# Patient Record
Sex: Female | Born: 1955 | Race: Black or African American | Hispanic: No | Smoking: Never smoker
Health system: Southern US, Community
[De-identification: ages and names within clinical notes are randomized; demographics above are authoritative.]

## PROBLEM LIST (undated history)

## (undated) DIAGNOSIS — J449 Chronic obstructive pulmonary disease, unspecified: Secondary | ICD-10-CM

## (undated) DIAGNOSIS — I499 Cardiac arrhythmia, unspecified: Secondary | ICD-10-CM

## (undated) DIAGNOSIS — I4891 Unspecified atrial fibrillation: Secondary | ICD-10-CM

## (undated) DIAGNOSIS — I10 Essential (primary) hypertension: Secondary | ICD-10-CM

## (undated) DIAGNOSIS — E119 Type 2 diabetes mellitus without complications: Secondary | ICD-10-CM

## (undated) DIAGNOSIS — D649 Anemia, unspecified: Secondary | ICD-10-CM

## (undated) DIAGNOSIS — G4730 Sleep apnea, unspecified: Secondary | ICD-10-CM

## (undated) DIAGNOSIS — Z8489 Family history of other specified conditions: Secondary | ICD-10-CM

## (undated) DIAGNOSIS — T8859XA Other complications of anesthesia, initial encounter: Secondary | ICD-10-CM

## (undated) DIAGNOSIS — I480 Paroxysmal atrial fibrillation: Secondary | ICD-10-CM

## (undated) DIAGNOSIS — I509 Heart failure, unspecified: Secondary | ICD-10-CM

## (undated) DIAGNOSIS — K760 Fatty (change of) liver, not elsewhere classified: Secondary | ICD-10-CM

## (undated) DIAGNOSIS — S0990XA Unspecified injury of head, initial encounter: Secondary | ICD-10-CM

## (undated) DIAGNOSIS — E785 Hyperlipidemia, unspecified: Secondary | ICD-10-CM

## (undated) HISTORY — DX: Fatty (change of) liver, not elsewhere classified: K76.0

## (undated) HISTORY — PX: BREAST REDUCTION SURGERY: SHX8

## (undated) HISTORY — DX: Essential (primary) hypertension: I10

## (undated) HISTORY — PX: OTHER SURGICAL HISTORY: SHX169

## (undated) HISTORY — DX: Paroxysmal atrial fibrillation: I48.0

## (undated) HISTORY — DX: Type 2 diabetes mellitus without complications: E11.9

## (undated) HISTORY — PX: ABDOMINAL HYSTERECTOMY: SHX81

## (undated) HISTORY — DX: Anemia, unspecified: D64.9

## (undated) HISTORY — DX: Unspecified injury of head, initial encounter: S09.90XA

## (undated) SURGERY — Surgical Case
Anesthesia: *Unknown

---

## 2005-08-12 ENCOUNTER — Encounter: Payer: Self-pay | Admitting: Cardiology

## 2007-11-21 ENCOUNTER — Encounter: Payer: Self-pay | Admitting: Cardiology

## 2008-03-01 ENCOUNTER — Encounter: Payer: Self-pay | Admitting: Cardiology

## 2009-03-18 ENCOUNTER — Encounter: Payer: Self-pay | Admitting: Cardiology

## 2009-07-14 ENCOUNTER — Encounter: Payer: Self-pay | Admitting: Cardiology

## 2009-07-26 DIAGNOSIS — I639 Cerebral infarction, unspecified: Secondary | ICD-10-CM

## 2009-07-26 HISTORY — DX: Cerebral infarction, unspecified: I63.9

## 2009-08-07 ENCOUNTER — Encounter (INDEPENDENT_AMBULATORY_CARE_PROVIDER_SITE_OTHER): Payer: Self-pay | Admitting: *Deleted

## 2009-08-07 ENCOUNTER — Ambulatory Visit: Payer: Self-pay | Admitting: Cardiology

## 2009-08-07 DIAGNOSIS — I10 Essential (primary) hypertension: Secondary | ICD-10-CM | POA: Insufficient documentation

## 2009-08-07 DIAGNOSIS — E119 Type 2 diabetes mellitus without complications: Secondary | ICD-10-CM | POA: Insufficient documentation

## 2009-08-07 DIAGNOSIS — R072 Precordial pain: Secondary | ICD-10-CM

## 2009-08-07 DIAGNOSIS — R002 Palpitations: Secondary | ICD-10-CM | POA: Insufficient documentation

## 2009-08-11 ENCOUNTER — Ambulatory Visit: Payer: Self-pay | Admitting: Cardiology

## 2009-08-11 ENCOUNTER — Encounter: Payer: Self-pay | Admitting: Cardiology

## 2009-08-20 ENCOUNTER — Telehealth (INDEPENDENT_AMBULATORY_CARE_PROVIDER_SITE_OTHER): Payer: Self-pay | Admitting: *Deleted

## 2009-08-27 ENCOUNTER — Encounter: Payer: Self-pay | Admitting: Cardiology

## 2009-08-28 ENCOUNTER — Encounter: Payer: Self-pay | Admitting: Cardiology

## 2009-08-29 ENCOUNTER — Encounter: Payer: Self-pay | Admitting: Cardiology

## 2009-09-03 ENCOUNTER — Encounter: Payer: Self-pay | Admitting: Cardiology

## 2009-09-04 ENCOUNTER — Ambulatory Visit: Payer: Self-pay | Admitting: Cardiology

## 2009-09-04 DIAGNOSIS — M79609 Pain in unspecified limb: Secondary | ICD-10-CM

## 2009-09-04 DIAGNOSIS — I482 Chronic atrial fibrillation, unspecified: Secondary | ICD-10-CM | POA: Insufficient documentation

## 2009-09-04 DIAGNOSIS — I4891 Unspecified atrial fibrillation: Secondary | ICD-10-CM | POA: Insufficient documentation

## 2009-09-08 ENCOUNTER — Telehealth (INDEPENDENT_AMBULATORY_CARE_PROVIDER_SITE_OTHER): Payer: Self-pay | Admitting: *Deleted

## 2009-09-12 ENCOUNTER — Encounter: Payer: Self-pay | Admitting: Cardiology

## 2009-09-12 ENCOUNTER — Ambulatory Visit: Payer: Self-pay | Admitting: Cardiology

## 2009-09-16 ENCOUNTER — Ambulatory Visit: Payer: Self-pay | Admitting: Cardiology

## 2009-09-16 LAB — CONVERTED CEMR LAB: POC INR: 1.2

## 2009-09-19 ENCOUNTER — Telehealth (INDEPENDENT_AMBULATORY_CARE_PROVIDER_SITE_OTHER): Payer: Self-pay | Admitting: *Deleted

## 2009-09-23 ENCOUNTER — Ambulatory Visit: Payer: Self-pay | Admitting: Cardiology

## 2009-09-30 ENCOUNTER — Ambulatory Visit: Payer: Self-pay | Admitting: Cardiology

## 2009-09-30 LAB — CONVERTED CEMR LAB: POC INR: 1.8

## 2009-10-01 ENCOUNTER — Telehealth (INDEPENDENT_AMBULATORY_CARE_PROVIDER_SITE_OTHER): Payer: Self-pay | Admitting: *Deleted

## 2009-10-14 ENCOUNTER — Ambulatory Visit: Payer: Self-pay | Admitting: Cardiology

## 2009-10-24 ENCOUNTER — Encounter: Payer: Self-pay | Admitting: Cardiology

## 2009-10-24 ENCOUNTER — Ambulatory Visit: Payer: Self-pay | Admitting: Cardiology

## 2009-10-24 LAB — CONVERTED CEMR LAB: POC INR: 2.2

## 2009-11-04 ENCOUNTER — Ambulatory Visit: Payer: Self-pay | Admitting: Cardiology

## 2009-11-04 LAB — CONVERTED CEMR LAB: POC INR: 1.9

## 2009-11-11 ENCOUNTER — Telehealth (INDEPENDENT_AMBULATORY_CARE_PROVIDER_SITE_OTHER): Payer: Self-pay | Admitting: *Deleted

## 2009-11-11 ENCOUNTER — Encounter: Payer: Self-pay | Admitting: Cardiology

## 2009-11-21 ENCOUNTER — Ambulatory Visit: Payer: Self-pay | Admitting: Cardiology

## 2009-12-16 ENCOUNTER — Ambulatory Visit: Payer: Self-pay | Admitting: Cardiology

## 2009-12-16 LAB — CONVERTED CEMR LAB: POC INR: 2.6

## 2010-01-16 ENCOUNTER — Ambulatory Visit: Payer: Self-pay | Admitting: Cardiology

## 2010-01-16 LAB — CONVERTED CEMR LAB: POC INR: 2.9

## 2010-02-13 ENCOUNTER — Ambulatory Visit: Payer: Self-pay | Admitting: Cardiology

## 2010-02-13 LAB — CONVERTED CEMR LAB: POC INR: 2.7

## 2010-03-13 ENCOUNTER — Ambulatory Visit: Payer: Self-pay | Admitting: Cardiology

## 2010-04-07 ENCOUNTER — Ambulatory Visit: Payer: Self-pay | Admitting: Cardiology

## 2010-05-01 ENCOUNTER — Encounter: Payer: Self-pay | Admitting: Cardiology

## 2010-06-25 DIAGNOSIS — S0990XA Unspecified injury of head, initial encounter: Secondary | ICD-10-CM

## 2010-06-25 HISTORY — DX: Unspecified injury of head, initial encounter: S09.90XA

## 2010-08-25 NOTE — Progress Notes (Signed)
Summary: INCREASED PALPITATION,HR  Phone Note Call from Patient Call back at [phone]   Caller: Patient Call For: doctor Summary of Call: Message left on voicemail from patient saying she is taking cardizem and is still having problems with palpitations,&increased  pulse 158-164 and her BP is 145/84. Please advise. Initial call taken by: Carlye Grippe,  September 19, 2009 2:13 PM  Follow-up for Phone Call        Increase toprol to 100 mg by mouth daily; make sure she has a f/u ov Ferman Hamming, MD, [facility]  September 19, 2009 2:24 PM Toprol was changed to Diltiazem d/t headaches. Cyril Loosen, RN, BSN  September 19, 2009 2:31 PM    Additional Follow-up for Phone Call Additional follow up Details #1::        change cardizem cd to 240 mg by mouth daily and schedule f/u ov Ferman Hamming, MD, [facility]  September 19, 2009 2:40 PM Pt notified. Pt verbalized understanding. She states she has felt more tired than usual since starting the diltiazem but will try to continue it for another week or so to see if this eases off. If she is unable to tolerate the diltiazem or has further problems, pt will contact our office. Additional Follow-up by: Cyril Loosen, RN, BSN,  September 19, 2009 2:49 PM    New/Updated Medications: DILTIAZEM HCL ER BEADS 240 MG XR24H-CAP (DILTIAZEM HCL ER BEADS) Take one capsule by mouth daily Prescriptions: DILTIAZEM HCL ER BEADS 240 MG XR24H-CAP (DILTIAZEM HCL ER BEADS) Take one capsule by mouth daily  #30 x 6   Entered by:   Cyril Loosen, RN, BSN   Authorized by:   Ferman Hamming, MD, [facility]   Signed by:   Cyril Loosen, RN, BSN on 09/19/2009   Method used:   [facility]        [facility]  [address] [phone]* (retail)       [address]       [place], [place]  [address]       Ph: [phone]       Fax: [phone]   RxID:   [id number]

## 2010-08-25 NOTE — Medication Information (Signed)
Summary: ccr  Anticoagulant Therapy  Managed by: Vashti Hey, RN PCP: Dr. Lynden Oxford Supervising MD: Andee Lineman MD, Michelle Piper Indication 1: Atrial Fibrillation Lab Used: [facility] [place] Site: [place] INR POC 1.4  Dietary changes: no    Health status changes: no    Bleeding/hemorrhagic complications: no    Recent/future hospitalizations: no    Any changes in medication regimen? no    Recent/future dental: no  Any missed doses?: no       Is patient compliant with meds? yes       Allergies: 1)  ! Lisinopril  Anticoagulation Management History:      The patient is taking warfarin and comes in today for a routine follow up visit.  Positive risk factors for bleeding include presence of serious comorbidities.  Negative risk factors for bleeding include an age less than 15 years old.  The bleeding index is 'intermediate risk'.  Positive CHADS2 values include History of HTN and History of Diabetes.  Negative CHADS2 values include Age > 80 years old.  Anticoagulation responsible provider: Andee Lineman MD, Michelle Piper.  INR POC: 1.4.  Cuvette Lot#: [id number].    Anticoagulation Management Assessment/Plan:      The patient's current anticoagulation dose is Warfarin sodium 5 mg tabs: Use as directed by Anticoagulation Clinic.  The target INR is 2.0-3.0.  The next INR is due 09/30/2009.  Anticoagulation instructions were given to patient.  Results were reviewed/authorized by Vashti Hey, RN.  She was notified by Vashti Hey RN.         Prior Anticoagulation Instructions: INR 1.2 Increase couamdin to 7.5mg  once daily   Current Anticoagulation Instructions: INR 1.4 Increase coumadin to 10mg  once daily except 7.5mg  on Mondays and Fridays

## 2010-08-25 NOTE — Procedures (Signed)
Summary: URGENT CARDIONET REPORT  URGENT CARDIONET REPORT   Imported By: Cyril Loosen, RN, BSN 08/27/2009 17:11:51  _____________________________________________________________________  External Attachment:    Type:   Image     Comment:   External Document  Appended Document: URGENT CARDIONET REPORT increase aspirin to 325 mg by mouth daily; schedule f/u ov soon. appear to have paf and will most likely need coumadin.  Appended Document: URGENT CARDIONET REPORT Pt notified and verbalized understanding. Pt's appt r/s from 2/23 to 2/10.

## 2010-08-25 NOTE — Medication Information (Signed)
Summary: NEW CCR-STARTED 2/10-JM  Anticoagulant Therapy  Managed by: Vashti Hey, RN PCP: Dr. Lynden Oxford Supervising MD: Diona Browner MD, Remi Deter Indication 1: Atrial Fibrillation Lab Used: [facility] [place] Site: [place] INR POC 1.1  Dietary changes: no    Health status changes: no    Bleeding/hemorrhagic complications: no    Recent/future hospitalizations: no    Any changes in medication regimen? yes       Details: Started on coumadin 5mg  qd on 09/08/09  Has 5mg  tablet  Recent/future dental: no  Any missed doses?: no       Is patient compliant with meds? yes      Comments: New to coumadin for atrial fib.  Coumadin teaching performed with pt.  Discussed potential benefits,adverse effects, food/drug interactions and importance of taking med as ordered and having INR checks as scheduled.  Pt verbalized understanding.  Allergies: 1)  ! Lisinopril  Anticoagulation Management History:      The patient comes in today for her initial visit for anticoagulation therapy.  Positive risk factors for bleeding include presence of serious comorbidities.  Negative risk factors for bleeding include an age less than 70 years old.  The bleeding index is 'intermediate risk'.  Positive CHADS2 values include History of HTN and History of Diabetes.  Negative CHADS2 values include Age > 74 years old.  Anticoagulation responsible provider: Diona Browner MD, Remi Deter.  INR POC: 1.1.    Anticoagulation Management Assessment/Plan:      The patient's current anticoagulation dose is Warfarin sodium 5 mg tabs: Use as directed by Anticoagulation Clinic.  The target INR is 2.0-3.0.  The next INR is due 09/16/2009.  Anticoagulation instructions were given to patient.  Results were reviewed/authorized by Vashti Hey, RN.  She was notified by Vashti Hey RN.        Coagulation management information includes: New Afib-  Medical Tx only at this time  Appt with Dr Andee Lineman 10/14/09.  Current Anticoagulation  Instructions: INR 1.1 Has only had 4 days of 5mg s.  Started on 2/14 Continue coumadin 5mg  once daily

## 2010-08-25 NOTE — Medication Information (Signed)
Summary: ccr-lr  Anticoagulant Therapy  Managed by: Vashti Hey, RN PCP: Dr. Lynden Oxford Supervising MD: Andee Lineman MD, Michelle Piper Indication 1: Atrial Fibrillation Lab Used: [facility] [place] Site: [place] INR POC 3.0  Dietary changes: no    Health status changes: no    Bleeding/hemorrhagic complications: no    Recent/future hospitalizations: no    Any changes in medication regimen? no    Recent/future dental: no  Any missed doses?: yes     Details: missed 1 dose last week  Is patient compliant with meds? yes       Allergies: 1)  ! Lisinopril  Anticoagulation Management History:      The patient is taking warfarin and comes in today for a routine follow up visit.  Positive risk factors for bleeding include presence of serious comorbidities.  Negative risk factors for bleeding include an age less than 57 years old.  The bleeding index is 'intermediate risk'.  Positive CHADS2 values include History of HTN and History of Diabetes.  Negative CHADS2 values include Age > 63 years old.  Anticoagulation responsible provider: Andee Lineman MD, Michelle Piper.  INR POC: 3.0.  Cuvette Lot#: [id number].  Exp: 02/2011.    Anticoagulation Management Assessment/Plan:      The patient's current anticoagulation dose is Warfarin sodium 5 mg tabs: Use as directed by Anticoagulation Clinic.  The target INR is 2.0-3.0.  The next INR is due 04/10/2010.  Anticoagulation instructions were given to patient.  Results were reviewed/authorized by Vashti Hey, RN.  She was notified by Vashti Hey RN.         Prior Anticoagulation Instructions: INR 2.7 Continue coumadin 12.5mg  once daily except 10mg  on Mondays  Current Anticoagulation Instructions: INR 3.0 Continue coumadin 12.5mg  once daily except 10mg  on Mondays Increase greens

## 2010-08-25 NOTE — Progress Notes (Signed)
Summary: re. co-pay  ---- Converted from flag ---- ---- 10/01/2009 3:07 PM, Alexis Goodell wrote: I called Jersi and told her we would monitor her claims for about 30 days or longer to determine what the insurance company would pay.  Once we have determined this amount we will charge this on an ongoing basis.  ---- 09/30/2009 12:04 PM, Vashti Hey RN wrote: Molly Maduro Please call pt about her $25 co-pay. Thanks Misty Stanley ------------------------------

## 2010-08-25 NOTE — Medication Information (Signed)
Summary: CCR  Anticoagulant Therapy  Managed by: Vashti Hey, RN PCP: Dr. Lynden Oxford Supervising MD: Andee Lineman MD, Michelle Piper Indication 1: Atrial Fibrillation Lab Used: [facility] [place] Site: [place] INR POC 1.8  Dietary changes: no    Health status changes: no    Bleeding/hemorrhagic complications: no    Recent/future hospitalizations: no    Any changes in medication regimen? no    Recent/future dental: no  Any missed doses?: no       Is patient compliant with meds? yes       Allergies: 1)  ! Lisinopril  Anticoagulation Management History:      The patient is taking warfarin and comes in today for a routine follow up visit.  Positive risk factors for bleeding include presence of serious comorbidities.  Negative risk factors for bleeding include an age less than 89 years old.  The bleeding index is 'intermediate risk'.  Positive CHADS2 values include History of HTN and History of Diabetes.  Negative CHADS2 values include Age > 2 years old.  Anticoagulation responsible provider: Andee Lineman MD, Michelle Piper.  INR POC: 1.8.  Cuvette Lot#: [id number].    Anticoagulation Management Assessment/Plan:      The patient's current anticoagulation dose is Warfarin sodium 5 mg tabs: Use as directed by Anticoagulation Clinic.  The target INR is 2.0-3.0.  The next INR is due 10/14/2009.  Anticoagulation instructions were given to patient.  Results were reviewed/authorized by Vashti Hey, RN.  She was notified by Vashti Hey RN.         Prior Anticoagulation Instructions: INR 1.4 Increase coumadin to 10mg  once daily except 7.5mg  on Mondays and Fridays  Current Anticoagulation Instructions: INR 1.8 Continue coumadin 10mg  once daily except 7.5mg  on Mondays and Fridays I

## 2010-08-25 NOTE — Procedures (Signed)
Summary: Urgent Cardionet Reports  Urgent Cardionet Reports   Imported By: Cyril Loosen, RN, BSN 08/29/2009 08:46:26  _____________________________________________________________________  External Attachment:    Type:   Image     Comment:   External Document  Appended Document: Urgent Cardionet Reports dc hctz; add toprol 50 mg by mouth daily.  Appended Document: Urgent Cardionet Reports Pt notified and verbalized understanding.   Clinical Lists Changes  Medications: Added new medication of METOPROLOL SUCCINATE 50 MG XR24H-TAB (METOPROLOL SUCCINATE) Take one tablet by mouth daily - Signed Rx of METOPROLOL SUCCINATE 50 MG XR24H-TAB (METOPROLOL SUCCINATE) Take one tablet by mouth daily;  #60 x 6;  Signed;  Entered by: Cyril Loosen, RN, BSN;  Authorized by: Ferman Hamming, MD, [facility];  Method used: [facility]  [address] [phone]*, [address], [place], [place]  [address], Ph: [phone], Fax: [phone]    Prescriptions: METOPROLOL SUCCINATE 50 MG XR24H-TAB (METOPROLOL SUCCINATE) Take one tablet by mouth daily  #60 x 6   Entered by:   Cyril Loosen, RN, BSN   Authorized by:   Ferman Hamming, MD, [facility]   Signed by:   Cyril Loosen, RN, BSN on 08/29/2009   Method used:   [facility]        [facility]  [address] [phone]* (retail)       [address]       [place], [place]  [address]       Ph: [phone]       Fax: [phone]   RxID:   [id number]

## 2010-08-25 NOTE — Procedures (Signed)
Summary: Holter and Event/ CARDIONET END OF SERVICE SUMMARY REPORT  Holter and Event/ CARDIONET END OF SERVICE SUMMARY REPORT   Imported By: Dorise Hiss 09/23/2009 15:27:17  _____________________________________________________________________  External Attachment:    Type:   Image     Comment:   External Document

## 2010-08-25 NOTE — Progress Notes (Signed)
Summary: STATUS OF STARTING COUMADIN/TOPROL SIDE EFFECT  Phone Note Call from Patient Call back at [phone]   Call For: nurse Summary of Call: Patient was told to start coumadin by Dr. Jens Som but haven't started it yet and she needed to cancel am appt. patient states she will call back to reshedule. Patient c/o to MD during ov that the  toprol was causing bad h/a. she was informed to start taking them in the morning instead of night and is still having bad h/a. Please advise. Initial call taken by: Carlye Grippe,  September 08, 2009 3:41 PM  Follow-up for Phone Call        Spoke with patient who state she had doubts about starting the coumadin. She states she called Dr. Margo Common and he was going to look at reports to let her know if she needs coumadin. Upon further discussion with pt, pt states she's just not sure if she wants to deal with PT/INR's. Pt notified of risks of a.fib without proper anticoagulation including risk of stroke. Pt states she will start coumadin. Pt scheduled for CCR visit with Misty Stanley on 2/18 following her echo at [facility].   Pt states she started Toprol on a Friday. She states that Sat or Sun she began having headaches. She discussed these headaches with Dr. Jens Som during recent office visit. She tried taking Toprol in am instead of pm but has not noticed any improvement with this change. She states these headaches are really bad, especially as she does not normally have headaches.  Follow-up by: Cyril Loosen, RN, BSN,  September 08, 2009 4:41 PM  Additional Follow-up for Phone Call Additional follow up Details #1::        DC toprol; cardizem CD 120 mg by mouth daily Ferman Hamming, MD, [facility]  September 09, 2009 12:21 PM Pt notified. Pt verbalized understanding.  Additional Follow-up by: Cyril Loosen, RN, BSN,  September 09, 2009 5:15 PM    New/Updated Medications: DILTIAZEM HCL ER BEADS 120 MG XR24H-CAP (DILTIAZEM HCL ER BEADS) Take one capsule by mouth  daily Prescriptions: DILTIAZEM HCL ER BEADS 120 MG XR24H-CAP (DILTIAZEM HCL ER BEADS) Take one capsule by mouth daily  #30 x 6   Entered by:   Cyril Loosen, RN, BSN   Authorized by:   Ferman Hamming, MD, [facility]   Signed by:   Cyril Loosen, RN, BSN on 09/09/2009   Method used:   [facility]        [facility]  [address] [phone]* (retail)       [address]       [place], [place]  [address]       Ph: [phone]       Fax: [phone]   RxID:   [id number]

## 2010-08-25 NOTE — Medication Information (Signed)
Summary: ccr-lr  Anticoagulant Therapy  Managed by: Weston Brass, PharmD PCP: Dr. Lynden Oxford Supervising MD: Andee Lineman MD, Michelle Piper Indication 1: Atrial Fibrillation Lab Used: [facility] [place] Site: [place] INR POC 2.9  Dietary changes: no    Health status changes: yes       Details: has had some swelling in ankles.  Is improving with addition of HCTZ  Bleeding/hemorrhagic complications: no    Recent/future hospitalizations: no    Any changes in medication regimen? yes       Details: changed benicar to losartan/hctz  Recent/future dental: no  Any missed doses?: no       Is patient compliant with meds? yes       Current Medications (verified): 1)  Glipizide 5 Mg Tabs (Glipizide) .... Take 1 Tablet By Mouth Daily 2)  Metformin Hcl 500 Mg Tabs (Metformin Hcl) .... Take 3 Tablet By Mouth Once A Day 3)  Tylenol With Codeine #3 300-30 Mg Tabs (Acetaminophen-Codeine) .... Take 1-2 Tablets Every 4-6 Hours As Needed Pain. 4)  Warfarin Sodium 5 Mg Tabs (Warfarin Sodium) .... Use As Directed By Anticoagulation Clinic 5)  Diltiazem Hcl Er Beads 240 Mg Xr24h-Cap (Diltiazem Hcl Er Beads) .... Take One Capsule By Mouth Daily 6)  Diltiazem Hcl 60 Mg Tabs (Diltiazem Hcl) .... Take One Tablet As Needed For Palpitations, No More Than One Per 24 Hours 7)  Flecainide Acetate 100 Mg Tabs (Flecainide Acetate) .... Take 1/2 Tab (50mg ) Two Times A Day 8)  Losartan Potassium-Hctz 100-25 Mg Tabs (Losartan Potassium-Hctz) .... Take 1 Tablet By Mouth Daily  Allergies: 1)  ! Lisinopril  Anticoagulation Management History:      The patient is taking warfarin and comes in today for a routine follow up visit.  Positive risk factors for bleeding include presence of serious comorbidities.  Negative risk factors for bleeding include an age less than 58 years old.  The bleeding index is 'intermediate risk'.  Positive CHADS2 values include History of HTN and History of Diabetes.  Negative CHADS2 values  include Age > 72 years old.  Anticoagulation responsible provider: Andee Lineman MD, Michelle Piper.  INR POC: 2.9.  Cuvette Lot#: [id number].  Exp: 02/2011.    Anticoagulation Management Assessment/Plan:      The patient's current anticoagulation dose is Warfarin sodium 5 mg tabs: Use as directed by Anticoagulation Clinic.  The target INR is 2.0-3.0.  The next INR is due 02/13/2010.  Anticoagulation instructions were given to patient.  Results were reviewed/authorized by Weston Brass, PharmD.  She was notified by Weston Brass PharmD.         Prior Anticoagulation Instructions: INR 2.6 Continue coumadin 12.5mg  once daily except 10mg  on Mondays  Current Anticoagulation Instructions: INR 2.9  Continue same dose of 12.5mg  daily except 10mg  on Monday.

## 2010-08-25 NOTE — Procedures (Signed)
Summary: Holter and Event/ CARDIONET DAILY REPORT  Holter and Event/ CARDIONET DAILY REPORT   Imported By: Dorise Hiss 09/05/2009 09:35:44  _____________________________________________________________________  External Attachment:    Type:   Image     Comment:   External Document

## 2010-08-25 NOTE — Assessment & Plan Note (Signed)
Summary: 1 MO   Visit Type:  Follow-up Primary Provider:  Dr. Lynden Oxford   History of Present Illness: the patient is a 55 year old African American female with history of paroxysmal atrial fibrillation. The patient has been started on flecainide. She has maintained normal sinus rhythm. Flecainide levels were determined as well as exercise testing to rule out any proarrhytmic risk. The patient however was not clear and instructions and is taking flecainide just once a day. She has been doing well with no recurrence of palpitations. She states that she is much improved. She denies any chest pain or shortness of breath presyncope or syncope. EKG today and states normal sinus rhythm  Preventive Screening-Counseling & Management  Alcohol-Tobacco     Smoking Status: never  Current Problems (verified): 1)  Coumadin Therapy  (ICD-V58.61) 2)  Atrial Fibrillation  (ICD-427.31) 3)  Atrial Fibrillation, Paroxysmal  (ICD-427.31) 4)  Finger Pain  (ICD-729.5) 5)  Essential Hypertension, Benign  (ICD-401.1) 6)  Dm  (ICD-250.00) 7)  Palpitations  (ICD-785.1) 8)  Chest Pain, Precordial  (ICD-786.51)  Current Medications (verified): 1)  Benicar 40 Mg Tabs (Olmesartan Medoxomil) .... Take 1 Tablet By Mouth Once A Day 2)  Glipizide 5 Mg Tabs (Glipizide) .... Take 1 Tablet By Mouth Daily 3)  Metformin Hcl 500 Mg Tabs (Metformin Hcl) .... Take 3 Tablet By Mouth Once A Day 4)  Tylenol With Codeine #3 300-30 Mg Tabs (Acetaminophen-Codeine) .... Take 1-2 Tablets Every 4-6 Hours As Needed Pain. 5)  Warfarin Sodium 5 Mg Tabs (Warfarin Sodium) .... Use As Directed By Anticoagulation Clinic 6)  Diltiazem Hcl Er Beads 240 Mg Xr24h-Cap (Diltiazem Hcl Er Beads) .... Take One Capsule By Mouth Daily 7)  Diltiazem Hcl 60 Mg Tabs (Diltiazem Hcl) .... Take One Tablet As Needed For Palpitations, No More Than One Per 24 Hours 8)  Flecainide Acetate 100 Mg Tabs (Flecainide Acetate) .... Take 1/2 Tab (50mg ) Two Times A  Day  Allergies: 1)  ! Lisinopril  Comments:  Nurse/Medical Assistant: The patient's medications were reviewed with the patient and were updated in the Medication List. Pt verbally confirmed medications. Pt has not increased Flecainide to two times a day yet. She wanted to d/w MD first. 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[employer]) Regular Exercise - no  Risk Factors: Exercise: no (08/07/2009)  Risk Factors: Smoking Status: never (11/21/2009)  Review of Systems  The patient denies fatigue, malaise, fever, weight gain/loss, vision loss, decreased hearing, hoarseness, chest pain, palpitations, shortness of breath, prolonged cough, wheezing, sleep apnea, coughing up blood, abdominal pain, blood in stool, nausea, vomiting, diarrhea, heartburn, incontinence, blood in urine, muscle weakness, joint pain, leg swelling, rash, skin lesions, headache, fainting, dizziness, depression, anxiety, enlarged lymph nodes, easy bruising or bleeding, and environmental allergies.    Vital Signs:  Patient profile:   55 year old female Height:      67 inches Weight:      239.50 pounds Pulse rate:   68 / minute BP sitting:   122 / 82  (left arm)  Cuff size:   large  Vitals Entered By: Cyril Loosen, RN, BSN (November 21, 2009 11:21 AM) Comments follow up appt   Physical Exam  Additional Exam:  General: Well-developed, well-nourished in no distress head: Normocephalic and atraumatic eyes PERRLA/EOMI intact, conjunctiva and lids  normal nose: No deformity or lesions mouth normal dentition, normal posterior pharynx neck: Supple, no JVD.  No masses, thyromegaly or abnormal cervical nodes lungs: Normal breath sounds bilaterally without wheezing.  Normal percussion heart: regular rate and rhythm with normal S1 and S2, no S3 or S4.  PMI is normal.  No pathological murmurs abdomen: Normal bowel sounds, abdomen is soft and nontender without masses, organomegaly or hernias noted.  No hepatosplenomegaly musculoskeletal: Back normal, normal gait muscle strength and tone normal pulsus: Pulse is normal in all 4 extremities Extremities: No peripheral pitting edema neurologic: Alert and oriented x 3 skin: Intact without lesions or rashes cervical nodes: No significant adenopathy psychologic: Normal affect    EKG  Procedure date:  11/21/2009  Findings:      NSR, no acute changes.   Impression & Recommendations:  Problem # 1:  ATRIAL FIBRILLATION, PAROXYSMAL (ICD-427.31) rhythm is controlled with flecainide Her updated medication list for this problem includes:    Warfarin Sodium 5 Mg Tabs (Warfarin sodium) ..... Use as directed by anticoagulation clinic    Flecainide Acetate 100 Mg Tabs (Flecainide acetate) .Marland Kitchen... Take 1/2 tab (50mg ) two times a day  Orders: EKG w/ Interpretation (93000)  Problem # 2:  COUMADIN THERAPY (ICD-V58.61) patient will be continued on Coumadin given her increased risk for thromboembolic disease  Problem # 3:  ESSENTIAL HYPERTENSION, BENIGN (ICD-401.1) Assessment: Comment Only  Her updated medication list for this problem includes:    Benicar 40 Mg Tabs (Olmesartan medoxomil) .Marland Kitchen... Take 1 tablet by mouth once a day    Diltiazem Hcl Er Beads 240 Mg Xr24h-cap (Diltiazem hcl er beads) .Marland Kitchen... Take one capsule by mouth daily    Diltiazem Hcl 60 Mg Tabs (Diltiazem hcl) .Marland Kitchen... Take one tablet as needed for palpitations, no more than one per 24 hours  Problem # 4:  DM (ICD-250.00)  Her updated  medication list for this problem includes:    Benicar 40 Mg Tabs (Olmesartan medoxomil) .Marland Kitchen... Take 1 tablet by mouth once a day    Glipizide 5 Mg Tabs (Glipizide) .Marland Kitchen... Take 1 tablet by mouth daily    Metformin Hcl 500 Mg Tabs (Metformin hcl) .Marland Kitchen... Take 3 tablet by mouth once a day  Patient Instructions: 1)  Flecainide should be 50mg  two times a day  2)  Follow up in  6 months

## 2010-08-25 NOTE — Medication Information (Signed)
Summary: ccr at Dr Andee Lineman appt-lr  Anticoagulant Therapy  Managed by: Vashti Hey, RN PCP: Dr. Lynden Oxford Supervising MD: Andee Lineman MD, Michelle Piper Indication 1: Atrial Fibrillation Lab Used: [facility] [place] Site: [place] INR POC 1.7  Dietary changes: no    Health status changes: no    Bleeding/hemorrhagic complications: no    Recent/future hospitalizations: no    Any changes in medication regimen? no    Recent/future dental: no  Any missed doses?: no       Is patient compliant with meds? yes       Allergies: 1)  ! Lisinopril  Anticoagulation Management History:      The patient is taking warfarin and comes in today for a routine follow up visit.  Positive risk factors for bleeding include presence of serious comorbidities.  Negative risk factors for bleeding include an age less than 38 years old.  The bleeding index is 'intermediate risk'.  Positive CHADS2 values include History of HTN and History of Diabetes.  Negative CHADS2 values include Age > 14 years old.  Anticoagulation responsible provider: Andee Lineman MD, Michelle Piper.  INR POC: 1.7.  Cuvette Lot#: [id number].    Anticoagulation Management Assessment/Plan:      The patient's current anticoagulation dose is Warfarin sodium 5 mg tabs: Use as directed by Anticoagulation Clinic.  The target INR is 2.0-3.0.  The next INR is due 10/24/2009.  Anticoagulation instructions were given to patient.  Results were reviewed/authorized by Vashti Hey, RN.  She was notified by Vashti Hey RN.         Prior Anticoagulation Instructions: INR 1.8 Continue coumadin 10mg  once daily except 7.5mg  on Mondays and Fridays  Current Anticoagulation Instructions: INR 1.7 Increase coumadin to 10mg  once daily except 12.5mg  on T,Th,Sat

## 2010-08-25 NOTE — Procedures (Signed)
Summary: Urgent Cardionet Report  Urgent Cardionet Report   Imported By: Cyril Loosen, RN, BSN 08/29/2009 09:05:28  _____________________________________________________________________  External Attachment:    Type:   Image     Comment:   External Document

## 2010-08-25 NOTE — Medication Information (Signed)
Summary: ccr-lr  Anticoagulant Therapy  Managed by: Chelsea Hey, RN PCP: Chelsea [name] Supervising MD: Andee Lineman MD, Michelle Piper Indication 1: Atrial Fibrillation Lab Used: [facility] [place] Site: [place] INR POC 1.2  Dietary changes: no    Health status changes: no    Bleeding/hemorrhagic complications: no    Recent/future hospitalizations: no    Any changes in medication regimen? no    Recent/future dental: no  Any missed doses?: no       Is patient compliant with meds? yes       Allergies: 1)  ! Lisinopril  Anticoagulation Management History:      The patient is taking warfarin and comes in today for a routine follow up visit.  Positive risk factors for bleeding include presence of serious comorbidities.  Negative risk factors for bleeding include an age less than 5 years old.  The bleeding index is 'intermediate risk'.  Positive CHADS2 values include History of HTN and History of Diabetes.  Negative CHADS2 values include Age > 24 years old.  Anticoagulation responsible provider: Andee Lineman MD, Michelle Piper.  INR POC: 1.2.  Cuvette Lot#: [id number].    Anticoagulation Management Assessment/Plan:      The patient's current anticoagulation dose is Warfarin sodium 5 mg tabs: Use as directed by Anticoagulation Clinic.  The target INR is 2.0-3.0.  The next INR is due 09/23/2009.  Anticoagulation instructions were given to patient.  Results were reviewed/authorized by Chelsea Hey, RN.  She was notified by Chelsea Hey RN.         Prior Anticoagulation Instructions: INR 1.1 Has only had 4 days of 5mg s.  Started on 2/14 Continue coumadin 5mg  once daily   Current Anticoagulation Instructions: INR 1.2 Increase couamdin to 7.5mg  once daily

## 2010-08-25 NOTE — Medication Information (Signed)
Summary: ccr-lr  Anticoagulant Therapy  Managed by: Vashti Hey, RN PCP: Dr. Lynden Oxford Supervising MD: Andee Lineman MD, Michelle Piper Indication 1: Atrial Fibrillation Lab Used: [facility] [place] Site: [place] INR POC 3.2  Dietary changes: no    Health status changes: no    Bleeding/hemorrhagic complications: no    Recent/future hospitalizations: no    Any changes in medication regimen? no    Recent/future dental: no  Any missed doses?: no       Is patient compliant with meds? yes       Allergies: 1)  ! Lisinopril  Anticoagulation Management History:      The patient is taking warfarin and comes in today for a routine follow up visit.  Positive risk factors for bleeding include presence of serious comorbidities.  Negative risk factors for bleeding include an age less than 59 years old.  The bleeding index is 'intermediate risk'.  Positive CHADS2 values include History of HTN and History of Diabetes.  Negative CHADS2 values include Age > 67 years old.  Anticoagulation responsible provider: Andee Lineman MD, Michelle Piper.  INR POC: 3.2.  Cuvette Lot#: [id number].  Exp: 02/2011.    Anticoagulation Management Assessment/Plan:      The patient's current anticoagulation dose is Warfarin sodium 5 mg tabs: Use as directed by Anticoagulation Clinic.  The target INR is 2.0-3.0.  The next INR is due 05/05/2010.  Anticoagulation instructions were given to patient.  Results were reviewed/authorized by Vashti Hey, RN.  She was notified by Vashti Hey RN.         Prior Anticoagulation Instructions: INR 3.0 Continue coumadin 12.5mg  once daily except 10mg  on Mondays Increase greens  Current Anticoagulation Instructions: INR 3.2 Take coumadin 10mg  tonight then decrease dose to 12.5mg  once daily except 10mg  on M,W,F Prescriptions: WARFARIN SODIUM 5 MG TABS (WARFARIN SODIUM) Use as directed by Anticoagulation Clinic  #90 x 3   Entered by:   Vashti Hey RN   Authorized by:   Lewayne Bunting, MD, [facility]   Signed by:    Vashti Hey RN on 04/07/2010   Method used:   [facility] to        [facility]  [address] [phone] (retail)       [address]       [place], [place]  [address]       Ph: [phone]       Fax: [phone]   RxID:   [id number]

## 2010-08-25 NOTE — Progress Notes (Signed)
Summary: flecainide dosage   ---- Converted from flag ---- ---- 11/06/2009 2:15 PM, Lewayne Bunting, MD, [facility] wrote: patient needs to be on 100 mg of flecainide p.o. b.i.d.  ---- 10/29/2009 9:19 AM, Hoover Brunette, LPN wrote: Her test was done on Flecainide 100mg  daily.  Her pt. instructions stated to start with 50mg  two times a day x 5 days, then increase to 100mg  two times a day.  When I did her rx I put 100mg  once daily instead of the two times a day which is what she was suppose to do.  She notified me of discrepancy.  Told her I would notify MD and he could advise to go ahead and increase or leave the same.  Patient verbalized understanding. ------------------------------  Phone Note Other Incoming   Summary of Call: Left message to return call.  Hoover Brunette, LPN  November 11, 2009 4:32 PM   Patient notified.    Hoover Brunette, LPN  November 17, 2009 12:05 PM

## 2010-08-25 NOTE — Procedures (Signed)
Summary: Urgent Cardionet Report  Urgent Cardionet Report   Imported By: Cyril Loosen, RN, BSN 08/29/2009 11:57:27  _____________________________________________________________________  External Attachment:    Type:   Image     Comment:   External Document  Appended Document: Urgent Cardionet Report Have patient seen in office this coming week.  Appended Document: Urgent Cardionet Report Pt has appt on 2/10-

## 2010-08-25 NOTE — Assessment & Plan Note (Signed)
Summary: NP-CHEST PAIN HIGH HEART RATES   Visit Type:  Initial Consult Primary Provider:  Dr. Onalee Hua Tapper,MD  CC:  follow-up visit.  History of Present Illness: 55 yo female for evaluation of chest pain and palpitations.  The patient has no prior cardiac history. She typically does not have dyspnea on exertion, orthopnea, PND, pedal edema, exertional chest pain or history of syncope. Over the past year she has had intermittent palpitations. They're sudden in onset and described as her heart "racing". It lasts several minutes and resolve spontaneously. They're not associated with activity. She felt some shortness of breath and chest tightness when she has these and mild presyncope but there is no frank syncope. There are no relieving factors. Because of the above we are asked to further evaluate.  Preventive Screening-Counseling & Management  Alcohol-Tobacco     Smoking Status: never  Current Medications (verified): 1)  Benicar 40 Mg Tabs (Olmesartan Medoxomil) .... Take 1 Tablet By Mouth Once A Day 2)  Glipizide 5 Mg Tabs (Glipizide) .... Take 1 Tablet By Mouth Daily 3)  Metformin Hcl 500 Mg Tabs (Metformin Hcl) .... Take 3 Tablet By Mouth Once A Day 4)  Hydrochlorothiazide 25 Mg Tabs (Hydrochlorothiazide) .... Take 1 Tablet By Mouth Once A Day 5)  Aspir-Low 81 Mg Tbec (Aspirin) .... Take 1 Tablet By Mouth Once A Day  Allergies (verified): 1)  ! Lisinopril  Comments:  Nurse/Medical Assistant: The patient's medications and allergies were reviewed with the patient and were updated in the Medication and Allergy Lists. Bottles brought.  Past History:  Past Medical History: type II DM Hypertension H/O fatty liver  Past Surgical History: Breast reduction Fibroid tumor removed from left knee hysterectomy c-section  Family History: Reviewed history and no changes required. Father with MI at age 53 Brother with MI at age 42  Social History: Reviewed history from 08/07/2009  and no changes required. 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 Remaining systems are negative.   Vital Signs:  Patient profile:   55 year old female Height:      67 inches Weight:      229 pounds BMI:     36.00 Pulse rate:   67 / minute BP supine:   111 / 76 Cuff size:   large  Vitals Entered By: Carlye Grippe (August 07, 2009 2:34 PM)  Nutrition Counseling: Patient's BMI is greater than 25 and therefore counseled on weight management options. CC: follow-up visit   Physical Exam  General:  Well developed/well nourished in NAD Skin warm/dry Patient not depressed No peripheral clubbing Back-normal HEENT-normal/normal eyelids Neck supple/normal carotid upstroke bilaterally; no bruits; no JVD; no thyromegaly chest - CTA/ normal expansion CV - RRR/normal S1 and S2; no murmurs, rubs or gallops;  PMI nondisplaced Abdomen -NT/ND, no HSM, no mass, + bowel sounds, no bruit 2+ femoral pulses, no bruits Ext-no edema, chords, 2+ DP Neuro-grossly nonfocal     EKG  Procedure date:  08/07/2009  Findings:      sinus rhythm at a rate of 86 with PACs. No significant ST changes noted.  Impression & Recommendations:  Problem # 1:  PALPITATIONS (ICD-785.1) Symptoms worrisome for SVT. Check TSH. Check CardioNet monitor.  Her updated medication  list for this problem includes:    Aspir-low 81 Mg Tbec (Aspirin) .Marland Kitchen... Take 1 tablet by mouth once a day  Orders: EKG w/ Interpretation (93000) Echo- Stress (Stress Echo) Cardionet/Event Monitor (Cardionet/Event) T-TSH [phone])  Problem # 2:  CHEST PAIN, PRECORDIAL (ICD-786.51)  Symptoms only occur with palpitations. However multiple risk factors.  Schedule stress echocardiogram to exclude ischemia and to also quantify LV function. Her updated medication list for this problem includes:    Aspir-low 81 Mg Tbec (Aspirin) .Marland Kitchen... Take 1 tablet by mouth once a day  Orders: Echo- Stress (Stress Echo) Cardionet/Event Monitor (Cardionet/Event) T-TSH [phone])  Problem # 3:  ESSENTIAL HYPERTENSION, BENIGN (ICD-401.1)  Blood pressure controlled on present medications. Will continue. Her updated medication list for this problem includes:    Benicar 40 Mg Tabs (Olmesartan medoxomil) .Marland Kitchen... Take 1 tablet by mouth once a day    Hydrochlorothiazide 25 Mg Tabs (Hydrochlorothiazide) .Marland Kitchen... Take 1 tablet by mouth once a day    Aspir-low 81 Mg Tbec (Aspirin) .Marland Kitchen... Take 1 tablet by mouth once a day  Orders: EKG w/ Interpretation (93000) Echo- Stress (Stress Echo) Cardionet/Event Monitor (Cardionet/Event) T-TSH [phone])  Problem # 4:  DM (ICD-250.00)  Her updated medication list for this problem includes:    Benicar 40 Mg Tabs (Olmesartan medoxomil) .Marland Kitchen... Take 1 tablet by mouth once a day    Glipizide 5 Mg Tabs (Glipizide) .Marland Kitchen... Take 1 tablet by mouth daily    Metformin Hcl 500 Mg Tabs (Metformin hcl) .Marland Kitchen... Take 3 tablet by mouth once a day    Aspir-low 81 Mg Tbec (Aspirin) .Marland Kitchen... Take 1 tablet by mouth once a day  Patient Instructions: 1)  Your physician has requested that you have a stress echocardiogram. For further information please visit [email or web address]  Please follow instruction sheet as given. 2)  Your physician has recommended that you wear an event monitor.  Event monitors are medical devices that record the heart's electrical activity. Doctors most often use these monitors to diagnose arrhythmias. Arrhythmias are problems with the speed or rhythm of the heartbeat. The monitor is a small, portable device. You can wear one while you do your normal daily activities. This is usually used to diagnose what is causing  palpitations/syncope (passing out). 3)  Your physician recommends that you go to the [facility] for lab work. If you prefer, you may go do your lab work at the hospital on the day of your stress echo.

## 2010-08-25 NOTE — Medication Information (Signed)
Summary: CCR-LR  Anticoagulant Therapy  Managed by: Vashti Hey, RN PCP: Dr. Lynden Oxford Supervising MD: Diona Browner MD, Remi Deter Indication 1: Atrial Fibrillation Lab Used: [facility] [place] Site: [place] INR POC 1.9  Dietary changes: no    Health status changes: no    Bleeding/hemorrhagic complications: no    Recent/future hospitalizations: no    Any changes in medication regimen? no    Recent/future dental: no  Any missed doses?: no       Is patient compliant with meds? yes       Allergies: 1)  ! Lisinopril  Anticoagulation Management History:      The patient is taking warfarin and comes in today for a routine follow up visit.  Positive risk factors for bleeding include presence of serious comorbidities.  Negative risk factors for bleeding include an age less than 24 years old.  The bleeding index is 'intermediate risk'.  Positive CHADS2 values include History of HTN and History of Diabetes.  Negative CHADS2 values include Age > 21 years old.  Anticoagulation responsible provider: Diona Browner MD, Remi Deter.  INR POC: 1.9.    Anticoagulation Management Assessment/Plan:      The patient's current anticoagulation dose is Warfarin sodium 5 mg tabs: Use as directed by Anticoagulation Clinic.  The target INR is 2.0-3.0.  The next INR is due 11/21/2009.  Anticoagulation instructions were given to patient.  Results were reviewed/authorized by Vashti Hey, RN.  She was notified by Vashti Hey RN.         Prior Anticoagulation Instructions: INR 2.2 Continue coumadin 10mg  once daily except 12.5mg  on T,Th,Sat  Current Anticoagulation Instructions: INR 1.9 Increase coumadin to 12.5mg  once daily except 10mg  on Mondays Prescriptions: WARFARIN SODIUM 5 MG TABS (WARFARIN SODIUM) Use as directed by Anticoagulation Clinic  #90 x 3   Entered by:   Vashti Hey RN   Authorized by:   Lewayne Bunting, MD, [facility]   Signed by:   Vashti Hey RN on 11/04/2009   Method used:   [facility] to   [facility]  [address] [phone]* (retail)       [address]       [place], [place]  [address]       Ph: [phone]       Fax: [phone]   RxID:   [id number]

## 2010-08-25 NOTE — Miscellaneous (Signed)
  Clinical Lists Changes  Observations: Added new observation of ETTFINDING: Comments :             This study was performed using the Standard Bruce exercise protocol. The patient exercised into stage 2 reaching 154 bpm, 91% MPHR. Maximum METs of 7 were achieved. The patient experienced no chest pain. Abnormal ST changes with 1.5 mm ST depression in leads II/III/aVF and 1 mm ST depression in leads V5-V6. There was a hypertensive response to stress. An adequate level of stress was achieved.       (10/24/2009 12:00)      Exercise Stress Test  Procedure date:  10/24/2009  Findings:      Comments :             This study was performed using the Standard Bruce exercise protocol. The patient exercised into stage 2 reaching 154 bpm, 91% MPHR. Maximum METs of 7 were achieved. The patient experienced no chest pain. Abnormal ST changes with 1.5 mm ST depression in leads II/III/aVF and 1 mm ST depression in leads V5-V6. There was a hypertensive response to stress. An adequate level of stress was achieved.

## 2010-08-25 NOTE — Letter (Signed)
Summary: Stress Echocardiography  [facility] at [facility] [address]. [address]   [place], [place] [address]   Phone: [phone]  Fax: [phone]      [facility] Cardiovascular Services  Stress Echocardiography    Chelsea [name]  Appointment Date:_  Appointment Time:_   Your doctor has ordered a stress echo to help determine the condition of your heart during exercise. If you take blood pressure medication, ask your doctor if you should take it the day of your test. You should not have anything to eat or drink at least 4 hours before your test is scheduled.  You will be asked to undress from the waist up and given a hospital gown to wear, so dress comfortably from the waist down for example: Sweat pants, shorts, or skirt Rubber soled lace up shoes (tennis shoes)  You will need to register at the Outpatient/Main Entrance at the hospital 15 minutes before your appointment time. It is a good idea to bring a copy of your order with you. They will direct you to the Cardiovascular Department on the third floor.   Plan on about an hour and a half  from registration to release from the hospital  Hold Glipizide and Metformin AM of test, if you do not eat or drink after midnight. Hold HCTZ am of test. You may take all of these after test.

## 2010-08-25 NOTE — Progress Notes (Signed)
Summary: Monitor  Phone Note Other Incoming   Caller: Gabby with [employer] Summary of Call: Gabby with [employer] called regarding pt's monitor order. She states they have been unable to reach pt. I confirmed home number and gave Gabby pt's cell number. She states they will continue to try to reach pt. Initial call taken by: Cyril Loosen, RN, BSN,  August 20, 2009 11:00 AM

## 2010-08-25 NOTE — Medication Information (Signed)
Summary: ccr-lr  Anticoagulant Therapy  Managed by: Vashti Hey, RN PCP: Dr. Lynden Oxford Supervising MD: Diona Browner MD, Remi Deter Indication 1: Atrial Fibrillation Lab Used: [facility] [place] Site: [place] INR POC 2.2  Dietary changes: no    Health status changes: no    Bleeding/hemorrhagic complications: no    Recent/future hospitalizations: no    Any changes in medication regimen? no    Recent/future dental: no  Any missed doses?: no       Is patient compliant with meds? yes       Allergies: 1)  ! Lisinopril  Anticoagulation Management History:      The patient is taking warfarin and comes in today for a routine follow up visit.  Positive risk factors for bleeding include presence of serious comorbidities.  Negative risk factors for bleeding include an age less than 16 years old.  The bleeding index is 'intermediate risk'.  Positive CHADS2 values include History of HTN and History of Diabetes.  Negative CHADS2 values include Age > 34 years old.  Anticoagulation responsible provider: Diona Browner MD, Remi Deter.  INR POC: 2.2.  Cuvette Lot#: [id number].    Anticoagulation Management Assessment/Plan:      The patient's current anticoagulation dose is Warfarin sodium 5 mg tabs: Use as directed by Anticoagulation Clinic.  The target INR is 2.0-3.0.  The next INR is due 11/04/2009.  Anticoagulation instructions were given to patient.  Results were reviewed/authorized by Vashti Hey, RN.  She was notified by Vashti Hey RN.         Prior Anticoagulation Instructions: INR 1.7 Increase coumadin to 10mg  once daily except 12.5mg  on T,Th,Sat  Current Anticoagulation Instructions: INR 2.2 Continue coumadin 10mg  once daily except 12.5mg  on T,Th,Sat

## 2010-08-25 NOTE — Medication Information (Signed)
Summary: ccr- at [facility] appt-lr  Anticoagulant Therapy  Managed by: Vashti Hey, RN PCP: Dr. Lynden Oxford Supervising MD: Diona Browner MD, Remi Deter Indication 1: Atrial Fibrillation Lab Used: [facility] [place] Site: [place] INR POC 2.9  Dietary changes: no    Health status changes: no    Bleeding/hemorrhagic complications: no    Recent/future hospitalizations: no    Any changes in medication regimen? no    Recent/future dental: no  Any missed doses?: no       Is patient compliant with meds? yes       Allergies: 1)  ! Lisinopril  Anticoagulation Management History:      The patient is taking warfarin and comes in today for a routine follow up visit.  Positive risk factors for bleeding include presence of serious comorbidities.  Negative risk factors for bleeding include an age less than 52 years old.  The bleeding index is 'intermediate risk'.  Positive CHADS2 values include History of HTN and History of Diabetes.  Negative CHADS2 values include Age > 15 years old.  Anticoagulation responsible provider: Diona Browner MD, Remi Deter.  INR POC: 2.9.  Cuvette Lot#: [id number].    Anticoagulation Management Assessment/Plan:      The patient's current anticoagulation dose is Warfarin sodium 5 mg tabs: Use as directed by Anticoagulation Clinic.  The target INR is 2.0-3.0.  The next INR is due 12/16/2009.  Anticoagulation instructions were given to patient.  Results were reviewed/authorized by Vashti Hey, RN.  She was notified by Vashti Hey RN.         Prior Anticoagulation Instructions: INR 1.9 Increase coumadin to 12.5mg  once daily except 10mg  on Mondays  Current Anticoagulation Instructions: INR 2.9 Continue coumadin 12.5mg  once daily except 10mg  on Mondays

## 2010-08-25 NOTE — Medication Information (Signed)
Summary: ccr-lr  Anticoagulant Therapy  Managed by: Vashti Hey, RN PCP: Dr. Lynden Oxford Supervising MD: Andee Lineman MD, Michelle Piper Indication 1: Atrial Fibrillation Lab Used: [facility] [place] Site: [place] INR POC 2.6  Dietary changes: no    Health status changes: no    Bleeding/hemorrhagic complications: no    Recent/future hospitalizations: no    Any changes in medication regimen? no    Recent/future dental: no  Any missed doses?: no       Is patient compliant with meds? yes       Allergies: 1)  ! Lisinopril  Anticoagulation Management History:      The patient is taking warfarin and comes in today for a routine follow up visit.  Positive risk factors for bleeding include presence of serious comorbidities.  Negative risk factors for bleeding include an age less than 38 years old.  The bleeding index is 'intermediate risk'.  Positive CHADS2 values include History of HTN and History of Diabetes.  Negative CHADS2 values include Age > 71 years old.  Anticoagulation responsible provider: Andee Lineman MD, Michelle Piper.  INR POC: 2.6.  Cuvette Lot#: [id number].    Anticoagulation Management Assessment/Plan:      The patient's current anticoagulation dose is Warfarin sodium 5 mg tabs: Use as directed by Anticoagulation Clinic.  The target INR is 2.0-3.0.  The next INR is due 01/16/2010.  Anticoagulation instructions were given to patient.  Results were reviewed/authorized by Vashti Hey, RN.  She was notified by Vashti Hey RN.         Prior Anticoagulation Instructions: INR 2.9 Continue coumadin 12.5mg  once daily except 10mg  on Mondays  Current Anticoagulation Instructions: INR 2.6 Continue coumadin 12.5mg  once daily except 10mg  on Mondays

## 2010-08-25 NOTE — Assessment & Plan Note (Signed)
Summary: 6 WK F/U PER 2/10 OV-JM   Visit Type:  Follow-up Primary Provider:  Dr. Onalee Hua Tapper,MD  CC:  follow-up visit.  History of Present Illness: the patient is a 55 year old female with paroxysmal atrial fibrillation seen previously by Dr. Jens Som. The patient had several adjustments in medications because of continued symptoms. She was switched from beta blocker to calcium channel blocker with subsequent increasing dosing. Despite medical changes the patient continues to have palpitations. She said that overall her symptoms are improved but she still has once every 3 days symptoms of dizziness associated with shortness of breath. 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 When she takes her heart rates typically about 180 beats per minute. Check prior workup with an echocardiogram shows minimal LVH but otherwise normal ejection fraction. Echocardiogram demonstrates a possible small patent foramen ovale. There is a very small degree of left to right shunting. No saline contrast study has been done. The patient had previous stress echocardiogram which was normal. Cardiac monitor showed multiple episodes of paroxysmal A. fib  with very fast heart rates.EKG shows normal sinus rhythm with otherwise normal tracing.  Preventive Screening-Counseling & Management  Alcohol-Tobacco     Smoking Status: never  Current Problems (verified): 1)  Coumadin Therapy  (ICD-V58.61) 2)  Atrial Fibrillation  (ICD-427.31) 3)  Atrial Fibrillation, Paroxysmal  (ICD-427.31) 4)  Finger Pain  (ICD-729.5) 5)  Essential Hypertension, Benign  (ICD-401.1) 6)  Dm  (ICD-250.00) 7)  Palpitations  (ICD-785.1) 8)  Chest Pain, Precordial  (ICD-786.51)  Current Medications (verified): 1)  Benicar 40 Mg Tabs (Olmesartan Medoxomil) .... Take 1 Tablet By Mouth Once A Day 2)  Glipizide 5 Mg Tabs (Glipizide) .... Take 1 Tablet By Mouth Daily 3)  Metformin Hcl 500 Mg Tabs  (Metformin Hcl) .... Take 3 Tablet By Mouth Once A Day 4)  Tylenol With Codeine #3 300-30 Mg Tabs (Acetaminophen-Codeine) .... Take 1-2 Tablets Every 4-6 Hours As Needed Pain. 5)  Warfarin Sodium 5 Mg Tabs (Warfarin Sodium) .... Use As Directed By Anticoagulation Clinic 6)  Diltiazem Hcl Er Beads 240 Mg Xr24h-Cap (Diltiazem Hcl Er Beads) .... Take One Capsule By Mouth Daily 7)  Diltiazem Hcl 60 Mg Tabs (Diltiazem Hcl) .... Take One Tablet As Needed For Palpitations, No More Than One Per 24 Hours 8)  Flecainide Acetate 100 Mg Tabs (Flecainide Acetate) .... Take 1/2 Tab (50mg ) Two Times A Day X 5 Days, Then Increase To 1 Tab Daily  Allergies (verified): 1)  ! Lisinopril  Comments:  Nurse/Medical Assistant: The patient is currently on medications but does not know the name or dosage at this time. Instructed to contact our office with details. Will update medication list at that time.  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  The patient denies fatigue, malaise, fever, weight gain/loss, vision loss, decreased hearing, hoarseness,  chest pain, prolonged cough, wheezing, sleep apnea, coughing up blood, abdominal pain, blood in stool, nausea, vomiting, diarrhea, heartburn, incontinence, blood in urine, muscle weakness, joint pain, leg swelling, rash, skin lesions, headache, fainting,  depression, anxiety, enlarged lymph nodes, easy bruising or bleeding, and environmental allergies.    Vital Signs:  Patient profile:   55 year old female Height:      67 inches Weight:      234 pounds Pulse rate:   73 / minute BP sitting:   107 / 74  (left arm) Cuff size:   large  Vitals Entered By: Carlye Grippe (October 14, 2009 10:34 AM) CC: follow-up visit   Physical Exam  Additional Exam:  General: Well-developed, well-nourished in no distress head: Normocephalic and atraumatic eyes PERRLA/EOMI intact, conjunctiva and lids normal nose: No deformity or lesions mouth normal dentition, normal posterior pharynx neck: Supple, no JVD.  No masses, thyromegaly or abnormal cervical nodes lungs: Normal breath sounds bilaterally without wheezing.  Normal percussion heart: regular rate and rhythm with normal S1 and S2, no S3 or S4.  PMI is normal.  No pathological murmurs abdomen: Normal bowel sounds, abdomen is soft and nontender without masses, organomegaly or hernias noted.  No hepatosplenomegaly musculoskeletal: Back normal, normal gait muscle strength and tone normal pulsus: Pulse is normal in all 4 extremities Extremities: No peripheral pitting edema neurologic: Alert and oriented x 3 skin: Intact without lesions or rashes cervical nodes: No significant adenopathy psychologic: Normal affect    EKG  Procedure date:  10/14/2009  Findings:      normal sinus rhythm. Heart rate 63 beats per minute. Otherwise normal tracing  Impression & Recommendations:  Problem # 1:  ATRIAL FIBRILLATION, PAROXYSMAL (ICD-427.31) the patient is to have frequent episodes of palpitations with fast heart rates. They occur once every 3 days. The patient is stop at work and rest each time. We have made a decision to start antiarrhythmic drug therapy today. I discussed risks and benefits of the medications with the patient. We will start flecainide 50 mg p.o. twice a day and increase to undergo twice  a day after 5 days. 3 days later we will perform a exercise treadmill test and a flap type trough level.the patient will need to continue diltiazem and also gave her a prescription of short-acting diltiazem to treat paroxysms of atrial fibrillation. The patient was instructed to take all her medications before exercise treadmill testing. Her updated medication list for this problem includes:    Warfarin Sodium 5 Mg Tabs (Warfarin sodium) ..... Use as directed by anticoagulation clinic    Flecainide Acetate 100 Mg Tabs (Flecainide acetate) .Marland Kitchen... Take 1/2 tab (50mg ) two times a day x 5 days, then increase to 1 tab daily  Orders: EKG w/ Interpretation (93000) GXT (GXT) T- * Misc. Laboratory test [phone])  Problem # 2:  ESSENTIAL HYPERTENSION, BENIGN (ICD-401.1) Assessment: Improved  Her updated medication list for this problem includes:    Benicar 40 Mg Tabs (Olmesartan medoxomil) .Marland Kitchen... Take 1 tablet by mouth once a day    Diltiazem Hcl Er Beads 240 Mg Xr24h-cap (Diltiazem hcl er beads) .Marland Kitchen... Take one capsule by mouth daily    Diltiazem Hcl 60 Mg Tabs (Diltiazem hcl) .Marland Kitchen... Take one tablet as needed for palpitations, no more than one per 24 hours  Problem # 3:  COUMADIN THERAPY (ICD-V58.61) Assessment: Comment Only  Problem # 4:  DM (ICD-250.00) Assessment: Comment Only  Her updated medication list for this problem includes:  Benicar 40 Mg Tabs (Olmesartan medoxomil) .Marland Kitchen... Take 1 tablet by mouth once a day    Glipizide 5 Mg Tabs (Glipizide) .Marland Kitchen... Take 1 tablet by mouth daily    Metformin Hcl 500 Mg Tabs (Metformin hcl) .Marland Kitchen... Take 3 tablet by mouth once a day  Patient Instructions: 1)  Diltiazem (short acting) 60mg  - take one tab as needed for palpitations in a 24 hour time frame   2)  Start Flecainide 50mg  two times a day x 5 days, then increase to 100mg  two times a day 3)  Three days after above, will need to have GXT and trough Flecainide level 4)  Follow up on 4/29 at  11:00 Prescriptions: FLECAINIDE ACETATE 100 MG TABS (FLECAINIDE ACETATE) take 1/2 tab (50mg ) two times a day x 5 days, then increase to 1 tab daily  #30 x 6   Entered by:   Hoover Brunette, LPN   Authorized by:   Lewayne Bunting, MD, [facility]   Signed by:   Hoover Brunette, LPN on 16/04/9603   Method used:   Electronically to        [facility]  [address] [phone]* (retail)       [address]       [place], [place]  [address]       Ph: [phone]       Fax: [phone]   RxID:   [id number]   Handout requested. DILTIAZEM HCL 60 MG TABS (DILTIAZEM HCL) take one tablet as needed for palpitations, no more than one per 24 hours  #30 x 1   Entered by:   Hoover Brunette, LPN   Authorized by:   Lewayne Bunting, MD, [facility]   Signed by:   Hoover Brunette, LPN on 25/36/6440   Method used:   Electronically to        [facility]  [address] [phone]* (retail)       [address]       [place], [place]  [address]       Ph: [phone]       Fax: [phone]   RxID:   [id number]

## 2010-08-25 NOTE — Medication Information (Signed)
Summary: rov/sp  Anticoagulant Therapy  Managed by: Vashti Hey, RN PCP: Dr. Lynden Oxford Supervising MD: Andee Lineman MD, Michelle Piper Indication 1: Atrial Fibrillation Lab Used: [facility] [place] Site: [place] INR POC 2.7  Dietary changes: no    Health status changes: no    Bleeding/hemorrhagic complications: no    Recent/future hospitalizations: no    Any changes in medication regimen? no    Recent/future dental: no  Any missed doses?: no       Is patient compliant with meds? yes       Allergies: 1)  ! Lisinopril  Anticoagulation Management History:      The patient is taking warfarin and comes in today for a routine follow up visit.  Positive risk factors for bleeding include presence of serious comorbidities.  Negative risk factors for bleeding include an age less than 53 years old.  The bleeding index is 'intermediate risk'.  Positive CHADS2 values include History of HTN and History of Diabetes.  Negative CHADS2 values include Age > 53 years old.  Anticoagulation responsible provider: Andee Lineman MD, Michelle Piper.  INR POC: 2.7.  Cuvette Lot#: [id number].  Exp: 02/2011.    Anticoagulation Management Assessment/Plan:      The patient's current anticoagulation dose is Warfarin sodium 5 mg tabs: Use as directed by Anticoagulation Clinic.  The target INR is 2.0-3.0.  The next INR is due 03/13/2010.  Anticoagulation instructions were given to patient.  Results were reviewed/authorized by Vashti Hey, RN.  She was notified by Vashti Hey RN.         Prior Anticoagulation Instructions: INR 2.9  Continue same dose of 12.5mg  daily except 10mg  on Monday.   Current Anticoagulation Instructions: INR 2.7 Continue coumadin 12.5mg  once daily except 10mg  on Mondays
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
## 2010-08-25 NOTE — Medication Information (Signed)
Summary: Coumadin Clinic  Anticoagulant Therapy  Managed by: Inactive PCP: Dr. Lynden Oxford Supervising MD: Andee Lineman MD, Michelle Piper Indication 1: Atrial Fibrillation Lab Used: [facility] Site: [place]          Comments: Pt called.  Is changing coumadin care to Dr Margo Common due to co-pay.  Allergies: 1)  ! Lisinopril  Anticoagulation Management History:      Positive risk factors for bleeding include presence of serious comorbidities.  Negative risk factors for bleeding include an age less than 60 years old.  The bleeding index is 'intermediate risk'.  Positive CHADS2 values include History of HTN and History of Diabetes.  Negative CHADS2 values include Age > 1 years old.  Anticoagulation responsible provider: Andee Lineman MD, Michelle Piper.  Exp: 02/2011.    Anticoagulation Management Assessment/Plan:      The patient's current anticoagulation dose is Warfarin sodium 5 mg tabs: Use as directed by Anticoagulation Clinic.  The target INR is 2.0-3.0.  The next INR is due 05/05/2010.  Anticoagulation instructions were given to patient.  Results were reviewed/authorized by Inactive.         Prior Anticoagulation Instructions: INR 3.2 Take coumadin 10mg  tonight then decrease dose to 12.5mg  once daily except 10mg  on M,W,F

## 2010-11-12 ENCOUNTER — Encounter: Payer: Self-pay | Admitting: Cardiology

## 2010-11-12 ENCOUNTER — Ambulatory Visit (INDEPENDENT_AMBULATORY_CARE_PROVIDER_SITE_OTHER): Payer: PRIVATE HEALTH INSURANCE | Admitting: Cardiology

## 2010-11-12 VITALS — BP 117/78 | HR 79 | Ht 67.0 in | Wt 242.0 lb

## 2010-11-12 DIAGNOSIS — Z9889 Other specified postprocedural states: Secondary | ICD-10-CM

## 2010-11-12 DIAGNOSIS — Z8679 Personal history of other diseases of the circulatory system: Secondary | ICD-10-CM

## 2010-11-12 DIAGNOSIS — Z7901 Long term (current) use of anticoagulants: Secondary | ICD-10-CM

## 2010-11-12 DIAGNOSIS — I4891 Unspecified atrial fibrillation: Secondary | ICD-10-CM

## 2010-11-12 NOTE — Assessment & Plan Note (Signed)
Continue current medical regimen with flecainide. EKG was reviewed today and the patient remained in normal sinus rhythm
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
## 2010-11-12 NOTE — Patient Instructions (Signed)
Continue all current medications. Your physician wants you to follow up in: 6 months.  You will receive a reminder letter in the mail one-two months in advance.  If you don't receive a letter, please call our office to schedule the follow up appointment   

## 2010-11-24 ENCOUNTER — Other Ambulatory Visit: Payer: Self-pay | Admitting: *Deleted

## 2010-11-24 MED ORDER — FLECAINIDE ACETATE 100 MG PO TABS: 100.0000 mg | ORAL_TABLET | Freq: Every day | ORAL | Status: DC

## 2010-11-25 ENCOUNTER — Other Ambulatory Visit: Payer: Self-pay | Admitting: Cardiology
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
## 2011-03-01 ENCOUNTER — Telehealth: Payer: Self-pay | Admitting: *Deleted

## 2011-03-01 NOTE — Telephone Encounter (Signed)
Patient walked into office last week.  Questioning if she could change meds to [facility] due to cheaper cost.    Flecainide 100mg  daily - advised her that this med was generic for Tambocor & no other alternative for her per Dr. Andee Lineman. Diltiazem 240mg  24 hr. daily - would be okay to use 2 tabs of the 120mg  on this.  Patient notified of above.  Stated she did go ahead and fill the 240mg  this time, but will call back when she wants this called to pharmacy.

## 2011-05-15 ENCOUNTER — Other Ambulatory Visit: Payer: Self-pay | Admitting: Cardiology

## 2011-08-02 ENCOUNTER — Other Ambulatory Visit: Payer: Self-pay | Admitting: *Deleted

## 2011-08-02 MED ORDER — FLECAINIDE ACETATE 100 MG PO TABS: 100.0000 mg | ORAL_TABLET | Freq: Every day | ORAL | Status: DC

## 2011-08-23 LAB — PROTIME-INR

## 2011-09-10 ENCOUNTER — Encounter: Payer: Self-pay | Admitting: Cardiovascular Disease

## 2011-09-10 ENCOUNTER — Ambulatory Visit (INDEPENDENT_AMBULATORY_CARE_PROVIDER_SITE_OTHER): Payer: PRIVATE HEALTH INSURANCE | Admitting: Cardiovascular Disease

## 2011-09-10 VITALS — BP 119/88 | HR 94 | Ht 67.0 in | Wt 246.0 lb

## 2011-09-10 DIAGNOSIS — G4733 Obstructive sleep apnea (adult) (pediatric): Secondary | ICD-10-CM

## 2011-09-10 DIAGNOSIS — R002 Palpitations: Secondary | ICD-10-CM

## 2011-09-10 DIAGNOSIS — I4891 Unspecified atrial fibrillation: Secondary | ICD-10-CM

## 2011-09-10 DIAGNOSIS — G4730 Sleep apnea, unspecified: Secondary | ICD-10-CM

## 2011-09-10 NOTE — Patient Instructions (Addendum)
Follow up as scheduled. Your physician recommends that you continue on your current medications as directed. Please refer to the Current Medication list given to you today. Your physician has requested that you have an echocardiogram. Echocardiography is a painless test that uses sound waves to create images of your heart. It provides your doctor with information about the size and shape of your heart and how well your heart's chambers and valves are working. This procedure takes approximately one hour. There are no restrictions for this procedure. Your physician recommends that you go to the [facility] for lab work: CMET/TSH/MAGNESIUM. Referral to Dr. Andrey Campanile for sleep evaluation.
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
## 2011-09-10 NOTE — Assessment & Plan Note (Signed)
She is maintaining a normal sinus rhythm with flecainide. She only had 2 brief episodes of atrial fibrillation. Her QT interval is slightly prolonged. Thus, I will check her electrolytes, renal function and magnesium level. I will also check her thyroid function.  she has not had an echocardiogram in one and thus I will request one.  She does not report any anginal symptoms. She informs me that there has been significant difficulties in maintaining therapeutic anticoagulation with warfarin. I discussed with her the other alternatives. One consideration would be Xarelto which I favor in her situation over Pradaxa. She is going to read about this and we'll decide during her next followup visit.

## 2011-09-10 NOTE — Assessment & Plan Note (Signed)
She has classic symptoms of sleep apnea which might be contributing to her fatigue and also might affect the burden of atrial fibrillation. Thus, I recommend a sleep study evaluation.

## 2011-09-13 ENCOUNTER — Ambulatory Visit: Payer: PRIVATE HEALTH INSURANCE | Admitting: Cardiology

## 2011-09-15 ENCOUNTER — Ambulatory Visit: Payer: PRIVATE HEALTH INSURANCE | Admitting: Cardiology

## 2011-09-23 ENCOUNTER — Encounter: Payer: Self-pay | Admitting: *Deleted

## 2011-10-12 ENCOUNTER — Telehealth: Payer: Self-pay | Admitting: Cardiovascular Disease

## 2011-10-12 NOTE — Telephone Encounter (Signed)
Received telephone call from Mrs. Linders that she needs to re-schedule her 2 D Echo.  Called her house And left message asking her to return my call.

## 2011-10-27 ENCOUNTER — Other Ambulatory Visit: Payer: PRIVATE HEALTH INSURANCE

## 2011-11-03 ENCOUNTER — Other Ambulatory Visit: Payer: Self-pay

## 2011-11-03 ENCOUNTER — Encounter (INDEPENDENT_AMBULATORY_CARE_PROVIDER_SITE_OTHER): Payer: PRIVATE HEALTH INSURANCE

## 2011-11-03 ENCOUNTER — Other Ambulatory Visit: Payer: Self-pay | Admitting: *Deleted

## 2011-11-03 ENCOUNTER — Other Ambulatory Visit (INDEPENDENT_AMBULATORY_CARE_PROVIDER_SITE_OTHER): Payer: PRIVATE HEALTH INSURANCE

## 2011-11-03 DIAGNOSIS — M79609 Pain in unspecified limb: Secondary | ICD-10-CM

## 2011-11-03 DIAGNOSIS — I4891 Unspecified atrial fibrillation: Secondary | ICD-10-CM

## 2011-11-03 DIAGNOSIS — M7989 Other specified soft tissue disorders: Secondary | ICD-10-CM

## 2011-11-03 DIAGNOSIS — R002 Palpitations: Secondary | ICD-10-CM

## 2011-11-09 ENCOUNTER — Encounter: Payer: Self-pay | Admitting: *Deleted

## 2011-11-17 ENCOUNTER — Other Ambulatory Visit: Payer: Self-pay | Admitting: Cardiology

## 2011-12-16 ENCOUNTER — Ambulatory Visit: Payer: PRIVATE HEALTH INSURANCE | Admitting: Cardiology

## 2012-01-03 ENCOUNTER — Other Ambulatory Visit: Payer: Self-pay | Admitting: Cardiology

## 2012-01-03 MED ORDER — FLECAINIDE ACETATE 100 MG PO TABS: 100.0000 mg | ORAL_TABLET | Freq: Every day | ORAL | Status: DC

## 2012-01-03 MED ORDER — DILTIAZEM HCL ER COATED BEADS 240 MG PO CP24: 240.0000 mg | ORAL_CAPSULE | Freq: Every day | ORAL | Status: DC

## 2012-02-24 ENCOUNTER — Ambulatory Visit: Payer: PRIVATE HEALTH INSURANCE | Admitting: Cardiology

## 2012-06-20 ENCOUNTER — Encounter: Payer: Self-pay | Admitting: Cardiovascular Disease

## 2012-06-20 ENCOUNTER — Ambulatory Visit (INDEPENDENT_AMBULATORY_CARE_PROVIDER_SITE_OTHER): Payer: PRIVATE HEALTH INSURANCE | Admitting: Cardiovascular Disease

## 2012-06-20 VITALS — BP 119/83 | HR 82 | Ht 67.0 in | Wt 244.0 lb

## 2012-06-20 DIAGNOSIS — I10 Essential (primary) hypertension: Secondary | ICD-10-CM

## 2012-06-20 DIAGNOSIS — I4891 Unspecified atrial fibrillation: Secondary | ICD-10-CM

## 2012-06-20 NOTE — Assessment & Plan Note (Signed)
Her blood pressure is well controlled on current medications. 

## 2012-06-20 NOTE — Patient Instructions (Addendum)
Continue same medications  Follow up in 1 year
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
## 2012-06-20 NOTE — Assessment & Plan Note (Signed)
She continues to be in normal sinus rhythm with no frequent episodes. She is on once daily flecainide which is usually a twice a day medication. However, it appears that she's been clinically stable with no significant breakthrough A. fib. Thus, no changes will be made today. I again discussed with him the alternatives to warfarin. She is concerned about the cost. When she checked with the pharmacy last time she was told that her co-pay would be $90.

## 2012-07-24 ENCOUNTER — Ambulatory Visit: Payer: PRIVATE HEALTH INSURANCE | Admitting: Cardiology

## 2012-08-01 ENCOUNTER — Ambulatory Visit: Payer: PRIVATE HEALTH INSURANCE | Admitting: Cardiovascular Disease

## 2012-08-04 ENCOUNTER — Other Ambulatory Visit: Payer: Self-pay | Admitting: Cardiology

## 2012-11-12 ENCOUNTER — Other Ambulatory Visit: Payer: Self-pay | Admitting: Cardiology

## 2013-03-27 ENCOUNTER — Other Ambulatory Visit: Payer: Self-pay | Admitting: Physician Assistant

## 2013-06-18 ENCOUNTER — Encounter: Payer: Self-pay | Admitting: Cardiology

## 2013-06-18 ENCOUNTER — Ambulatory Visit (INDEPENDENT_AMBULATORY_CARE_PROVIDER_SITE_OTHER): Payer: PRIVATE HEALTH INSURANCE | Admitting: Cardiology

## 2013-06-18 VITALS — BP 129/86 | HR 83 | Ht 67.0 in | Wt 247.0 lb

## 2013-06-18 DIAGNOSIS — I10 Essential (primary) hypertension: Secondary | ICD-10-CM

## 2013-06-18 DIAGNOSIS — I4891 Unspecified atrial fibrillation: Secondary | ICD-10-CM
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
## 2013-06-18 NOTE — Patient Instructions (Signed)
Your physician recommends that you schedule a follow-up appointment in: 1 year with Dr. Branch. You should receive a letter in the mail in 10 months. If you do not receive this letter by September 2015 call our office to schedule this appointment.   Your physician recommends that you continue on your current medications as directed. Please refer to the Current Medication list given to you today.  

## 2013-07-11 ENCOUNTER — Other Ambulatory Visit: Payer: Self-pay | Admitting: *Deleted

## 2013-07-11 ENCOUNTER — Other Ambulatory Visit: Payer: Self-pay | Admitting: Cardiology

## 2013-07-11 ENCOUNTER — Other Ambulatory Visit: Payer: Self-pay | Admitting: Cardiovascular Disease

## 2013-07-11 MED ORDER — FLECAINIDE ACETATE 100 MG PO TABS: ORAL_TABLET | ORAL | Status: DC

## 2013-07-11 MED ORDER — DILTIAZEM HCL ER COATED BEADS 240 MG PO CP24: ORAL_CAPSULE | ORAL | Status: DC

## 2013-07-11 NOTE — Telephone Encounter (Signed)
Requested Prescriptions   Signed Prescriptions Disp Refills  . diltiazem (CARDIZEM CD) 240 MG 24 hr capsule 30 capsule 0    Sig: TAKE ONE CAPSULE BY MOUTH EVERY DAY    Authorizing Provider: Lorine Bears A    Ordering User: Kendrick Fries

## 2013-08-13 ENCOUNTER — Other Ambulatory Visit: Payer: Self-pay | Admitting: Cardiovascular Disease

## 2014-03-08 ENCOUNTER — Other Ambulatory Visit: Payer: Self-pay | Admitting: *Deleted

## 2014-03-08 MED ORDER — FLECAINIDE ACETATE 100 MG PO TABS: ORAL_TABLET | ORAL | Status: DC

## 2014-04-13 ENCOUNTER — Other Ambulatory Visit: Payer: Self-pay | Admitting: Cardiovascular Disease

## 2014-07-12 ENCOUNTER — Ambulatory Visit (INDEPENDENT_AMBULATORY_CARE_PROVIDER_SITE_OTHER): Payer: PRIVATE HEALTH INSURANCE | Admitting: Cardiology

## 2014-07-12 ENCOUNTER — Encounter: Payer: Self-pay | Admitting: Cardiology

## 2014-07-12 VITALS — BP 113/78 | HR 89 | Ht 67.0 in | Wt 247.0 lb

## 2014-07-12 DIAGNOSIS — I10 Essential (primary) hypertension: Secondary | ICD-10-CM

## 2014-07-12 DIAGNOSIS — I4891 Unspecified atrial fibrillation: Secondary | ICD-10-CM
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
## 2014-07-12 NOTE — Patient Instructions (Signed)
Continue all current medications. Your physician wants you to follow up in:  1 year.  You will receive a reminder letter in the mail one-two months in advance.  If you don't receive a letter, please call our office to schedule the follow up appointment   

## 2014-07-16 ENCOUNTER — Other Ambulatory Visit: Payer: Self-pay | Admitting: Cardiology

## 2014-07-16 MED ORDER — DILTIAZEM HCL ER COATED BEADS 240 MG PO CP24: 240.0000 mg | ORAL_CAPSULE | Freq: Every day | ORAL | Status: DC

## 2014-07-16 NOTE — Telephone Encounter (Signed)
Received fax refill request  Rx # [id number] Medication:  Diltiazem 24 HR ER 240 mg cap Qty 30 Sig:  Take one capsule by mouth every day Physician:  Wyline MoodBranch

## 2014-12-07 ENCOUNTER — Other Ambulatory Visit: Payer: Self-pay | Admitting: Cardiology

## 2015-04-30 ENCOUNTER — Other Ambulatory Visit: Payer: Self-pay | Admitting: *Deleted

## 2015-04-30 MED ORDER — DILTIAZEM HCL ER COATED BEADS 240 MG PO CP24: 240.0000 mg | ORAL_CAPSULE | Freq: Every day | ORAL | Status: DC

## 2015-07-30 ENCOUNTER — Ambulatory Visit: Payer: PRIVATE HEALTH INSURANCE | Admitting: Cardiology

## 2015-07-30 ENCOUNTER — Encounter: Payer: Self-pay | Admitting: Cardiology

## 2015-07-30 ENCOUNTER — Ambulatory Visit (INDEPENDENT_AMBULATORY_CARE_PROVIDER_SITE_OTHER): Payer: PRIVATE HEALTH INSURANCE | Admitting: Cardiology

## 2015-07-30 ENCOUNTER — Encounter: Payer: Self-pay | Admitting: *Deleted

## 2015-07-30 VITALS — BP 121/80 | HR 96 | Ht 67.0 in | Wt 235.0 lb

## 2015-07-30 DIAGNOSIS — I10 Essential (primary) hypertension: Secondary | ICD-10-CM

## 2015-07-30 DIAGNOSIS — G4730 Sleep apnea, unspecified: Secondary | ICD-10-CM | POA: Diagnosis not present

## 2015-07-30 DIAGNOSIS — I4891 Unspecified atrial fibrillation: Secondary | ICD-10-CM

## 2015-07-30 DIAGNOSIS — E785 Hyperlipidemia, unspecified: Secondary | ICD-10-CM | POA: Diagnosis not present

## 2015-07-30 MED ORDER — ATORVASTATIN CALCIUM 40 MG PO TABS: 40.0000 mg | ORAL_TABLET | Freq: Every day | ORAL | Status: DC

## 2015-07-30 NOTE — Patient Instructions (Signed)
Your physician wants you to follow-up in: 1 YEAR WITH DR. BRANCH You will receive a reminder letter in the mail two months in advance. If you don't receive a letter, please call our office to schedule the follow-up appointment.  Your physician has recommended you make the following change in your medication:   START ATORVASTATIN 40 MG DAILY  You have been referred to DR. HAWKINS SLEEP APNEA   Thank you for choosing [facility]!!
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
## 2015-08-26 ENCOUNTER — Ambulatory Visit: Payer: PRIVATE HEALTH INSURANCE | Admitting: Cardiology

## 2015-08-29 ENCOUNTER — Ambulatory Visit: Payer: PRIVATE HEALTH INSURANCE | Admitting: Cardiology

## 2015-09-08 ENCOUNTER — Other Ambulatory Visit: Payer: Self-pay | Admitting: *Deleted

## 2015-09-08 ENCOUNTER — Other Ambulatory Visit: Payer: Self-pay | Admitting: Cardiology

## 2015-09-08 MED ORDER — DILTIAZEM HCL ER COATED BEADS 240 MG PO CP24: 240.0000 mg | ORAL_CAPSULE | Freq: Every day | ORAL | Status: DC

## 2016-06-05 ENCOUNTER — Other Ambulatory Visit: Payer: Self-pay | Admitting: Cardiology

## 2016-09-02 ENCOUNTER — Other Ambulatory Visit: Payer: Self-pay | Admitting: *Deleted

## 2016-09-02 MED ORDER — FLECAINIDE ACETATE 100 MG PO TABS: 100.0000 mg | ORAL_TABLET | Freq: Every day | ORAL | 3 refills | Status: DC

## 2016-09-03 ENCOUNTER — Other Ambulatory Visit: Payer: Self-pay

## 2016-09-03 MED ORDER — DILTIAZEM HCL ER COATED BEADS 240 MG PO CP24: 240.0000 mg | ORAL_CAPSULE | Freq: Every day | ORAL | 6 refills | Status: DC

## 2016-09-22 ENCOUNTER — Encounter: Payer: Self-pay | Admitting: *Deleted

## 2016-09-23 ENCOUNTER — Encounter: Payer: Self-pay | Admitting: Cardiology

## 2016-09-23 ENCOUNTER — Ambulatory Visit (INDEPENDENT_AMBULATORY_CARE_PROVIDER_SITE_OTHER): Payer: PRIVATE HEALTH INSURANCE | Admitting: Cardiology

## 2016-09-23 VITALS — BP 120/76 | HR 77 | Ht 67.0 in | Wt 226.2 lb

## 2016-09-23 DIAGNOSIS — I10 Essential (primary) hypertension: Secondary | ICD-10-CM | POA: Diagnosis not present

## 2016-09-23 DIAGNOSIS — I4891 Unspecified atrial fibrillation: Secondary | ICD-10-CM | POA: Diagnosis not present

## 2016-09-23 DIAGNOSIS — R011 Cardiac murmur, unspecified: Secondary | ICD-10-CM | POA: Diagnosis not present

## 2016-09-23 DIAGNOSIS — E782 Mixed hyperlipidemia: Secondary | ICD-10-CM

## 2016-09-23 NOTE — Patient Instructions (Signed)
Your physician wants you to follow-up in: 1 YEAR WITH DR. BRANCH You will receive a reminder letter in the mail two months in advance. If you don't receive a letter, please call our office to schedule the follow-up appointment.  Your physician recommends that you continue on your current medications as directed. Please refer to the Current Medication list given to you today.  PLEASE CALL US WHEN YOU ARE READY TO SCHEDULE YOUR ECHOCARDIOGRAM AND SLEEP STUDY    Thank you for choosing [facility]!!
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
## 2016-09-30 ENCOUNTER — Other Ambulatory Visit: Payer: Self-pay | Admitting: *Deleted

## 2016-09-30 ENCOUNTER — Ambulatory Visit (INDEPENDENT_AMBULATORY_CARE_PROVIDER_SITE_OTHER): Payer: PRIVATE HEALTH INSURANCE | Admitting: Otolaryngology

## 2016-09-30 DIAGNOSIS — H9041 Sensorineural hearing loss, unilateral, right ear, with unrestricted hearing on the contralateral side: Secondary | ICD-10-CM

## 2016-09-30 DIAGNOSIS — H9121 Sudden idiopathic hearing loss, right ear: Secondary | ICD-10-CM | POA: Diagnosis not present

## 2016-09-30 MED ORDER — DILTIAZEM HCL ER COATED BEADS 240 MG PO CP24: 240.0000 mg | ORAL_CAPSULE | Freq: Every day | ORAL | 3 refills | Status: DC

## 2016-10-09 ENCOUNTER — Other Ambulatory Visit: Payer: Self-pay | Admitting: Cardiology

## 2017-02-01 ENCOUNTER — Other Ambulatory Visit: Payer: Self-pay | Admitting: Cardiology

## 2017-03-21 ENCOUNTER — Ambulatory Visit (INDEPENDENT_AMBULATORY_CARE_PROVIDER_SITE_OTHER): Payer: Commercial Managed Care - PPO | Admitting: Otolaryngology

## 2017-03-21 DIAGNOSIS — H9041 Sensorineural hearing loss, unilateral, right ear, with unrestricted hearing on the contralateral side: Secondary | ICD-10-CM | POA: Diagnosis not present

## 2017-09-05 ENCOUNTER — Other Ambulatory Visit: Payer: Self-pay | Admitting: Cardiology

## 2017-09-25 ENCOUNTER — Other Ambulatory Visit: Payer: Self-pay | Admitting: Cardiology

## 2017-11-24 ENCOUNTER — Other Ambulatory Visit: Payer: Self-pay | Admitting: Cardiology

## 2017-12-12 ENCOUNTER — Other Ambulatory Visit: Payer: Self-pay | Admitting: Cardiology

## 2017-12-27 ENCOUNTER — Other Ambulatory Visit: Payer: Self-pay

## 2017-12-27 MED ORDER — FLECAINIDE ACETATE 100 MG PO TABS: ORAL_TABLET | ORAL | 1 refills | Status: DC

## 2018-01-24 ENCOUNTER — Encounter: Payer: Self-pay | Admitting: *Deleted

## 2018-01-24 ENCOUNTER — Ambulatory Visit (INDEPENDENT_AMBULATORY_CARE_PROVIDER_SITE_OTHER): Payer: Commercial Managed Care - PPO | Admitting: Cardiology

## 2018-01-24 ENCOUNTER — Encounter: Payer: Self-pay | Admitting: Cardiology

## 2018-01-24 VITALS — BP 150/76 | HR 99 | Ht 67.0 in | Wt 227.2 lb

## 2018-01-24 DIAGNOSIS — I10 Essential (primary) hypertension: Secondary | ICD-10-CM | POA: Diagnosis not present

## 2018-01-24 DIAGNOSIS — I4891 Unspecified atrial fibrillation: Secondary | ICD-10-CM

## 2018-01-24 DIAGNOSIS — E119 Type 2 diabetes mellitus without complications: Secondary | ICD-10-CM | POA: Diagnosis not present

## 2018-01-24 MED ORDER — FUROSEMIDE 40 MG PO TABS: ORAL_TABLET | ORAL | 1 refills | Status: DC

## 2018-01-24 NOTE — Patient Instructions (Signed)
Your physician wants you to follow-up in: [facility] You will receive a reminder letter in the mail two months in advance. If you don't receive a letter, please call our office to schedule the follow-up appointment.  Your physician has recommended you make the following change in your medication:   CHANGE LASIX 40 MG AS NEEDED   Thank you for choosing [facility]!!
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
## 2018-02-16 ENCOUNTER — Ambulatory Visit (INDEPENDENT_AMBULATORY_CARE_PROVIDER_SITE_OTHER): Payer: Commercial Managed Care - PPO | Admitting: Otolaryngology

## 2018-02-20 ENCOUNTER — Ambulatory Visit (INDEPENDENT_AMBULATORY_CARE_PROVIDER_SITE_OTHER): Payer: Commercial Managed Care - PPO | Admitting: Otolaryngology

## 2018-02-20 DIAGNOSIS — H903 Sensorineural hearing loss, bilateral: Secondary | ICD-10-CM

## 2018-02-20 DIAGNOSIS — H9121 Sudden idiopathic hearing loss, right ear: Secondary | ICD-10-CM | POA: Diagnosis not present

## 2018-02-20 DIAGNOSIS — H6123 Impacted cerumen, bilateral: Secondary | ICD-10-CM

## 2018-02-23 ENCOUNTER — Other Ambulatory Visit: Payer: Self-pay | Admitting: Cardiology

## 2018-05-04 ENCOUNTER — Other Ambulatory Visit: Payer: Self-pay | Admitting: Cardiology

## 2018-07-19 ENCOUNTER — Other Ambulatory Visit: Payer: Self-pay | Admitting: Cardiology

## 2018-10-16 ENCOUNTER — Other Ambulatory Visit: Payer: Self-pay | Admitting: Cardiology

## 2018-10-18 ENCOUNTER — Other Ambulatory Visit: Payer: Self-pay | Admitting: Cardiology

## 2018-11-13 ENCOUNTER — Other Ambulatory Visit: Payer: Self-pay | Admitting: Cardiology

## 2019-02-19 ENCOUNTER — Ambulatory Visit (INDEPENDENT_AMBULATORY_CARE_PROVIDER_SITE_OTHER): Payer: Commercial Managed Care - PPO | Admitting: Otolaryngology
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
## 2019-03-05 NOTE — Patient Instructions (Addendum)
Medication Instructions:  Continue all current medications.  Labwork:  BMET - order given today.   Office will contact with results via phone or letter.     Testing/Procedures: none  Follow-Up: Your physician wants you to follow up in:  1 year.  You will receive a reminder letter in the mail one-two months in advance.  If you don't receive a letter, please call our office to schedule the follow up appointment - Dr. [name].  Any Other Special Instructions Will Be Listed Below (If Applicable). Establish with our coumadin clinic.    If you need a refill on your cardiac medications before your next appointment, please call your pharmacy.  ++++++++++++++++++++++++++++++++++++++++  Lifestyle Modifications to Prevent and Treat Heart Disease -Recommend heart healthy/Mediterranean diet, with whole grains, fruits, vegetables, fish, lean meats, nuts, olive oil and avocado oil.  -Limit salt intake to less than 1500 mg per day.  -Recommend moderate walking, starting slowly with a few minutes and working up to 3-5 times/week for 30-50 minutes each session. Aim for at least 150 minutes.week. Goal should be pace of 3 miles/hours, or walking 1.5 miles in 30 minutes -Recommend avoidance of tobacco products. Avoid excess alcohol. -Keep blood pressure well controlled, ideally less than 130/80.

## 2019-03-06 ENCOUNTER — Encounter: Payer: Self-pay | Admitting: Cardiology

## 2019-03-06 ENCOUNTER — Other Ambulatory Visit: Payer: Self-pay

## 2019-03-06 ENCOUNTER — Ambulatory Visit: Payer: Commercial Managed Care - PPO | Admitting: Cardiology

## 2019-03-06 VITALS — BP 130/70 | HR 78 | Temp 98.8°F | Ht 67.0 in | Wt 226.0 lb

## 2019-03-06 DIAGNOSIS — I4891 Unspecified atrial fibrillation: Secondary | ICD-10-CM | POA: Diagnosis not present

## 2019-03-06 DIAGNOSIS — E119 Type 2 diabetes mellitus without complications: Secondary | ICD-10-CM

## 2019-03-06 DIAGNOSIS — R600 Localized edema: Secondary | ICD-10-CM

## 2019-03-06 DIAGNOSIS — E785 Hyperlipidemia, unspecified: Secondary | ICD-10-CM

## 2019-03-06 DIAGNOSIS — N189 Chronic kidney disease, unspecified: Secondary | ICD-10-CM

## 2019-03-06 DIAGNOSIS — I10 Essential (primary) hypertension: Secondary | ICD-10-CM | POA: Diagnosis not present

## 2019-03-19 ENCOUNTER — Other Ambulatory Visit: Payer: Self-pay

## 2019-03-19 ENCOUNTER — Ambulatory Visit (INDEPENDENT_AMBULATORY_CARE_PROVIDER_SITE_OTHER): Payer: Commercial Managed Care - PPO | Admitting: *Deleted

## 2019-03-19 DIAGNOSIS — I4811 Longstanding persistent atrial fibrillation: Secondary | ICD-10-CM | POA: Diagnosis not present

## 2019-03-19 DIAGNOSIS — I482 Chronic atrial fibrillation, unspecified: Secondary | ICD-10-CM

## 2019-03-19 DIAGNOSIS — Z5181 Encounter for therapeutic drug level monitoring: Secondary | ICD-10-CM

## 2019-03-19 LAB — POCT INR: INR: 1.5 — AB (ref 2.0–3.0)

## 2019-03-19 NOTE — Patient Instructions (Signed)
Take warfarin 3 tablets tonight then increase dose to 2 tablets daily except 3 tablets on Tuesdays and Fridays Recheck in 2 weeks

## 2019-04-03 ENCOUNTER — Telehealth: Payer: Self-pay | Admitting: Cardiology

## 2019-04-03 NOTE — Telephone Encounter (Signed)
Spoke with patient. Patient should continue to take warfarin 4mg ,  2 tablets daily except 3 tablets on Tuesdays and Fridays until she has her INR checked on Friday

## 2019-04-03 NOTE — Telephone Encounter (Signed)
Patient asking how she should take her coumadin until she comes in 04/06/2019 to have checked

## 2019-04-06 ENCOUNTER — Other Ambulatory Visit: Payer: Self-pay

## 2019-04-06 ENCOUNTER — Ambulatory Visit (INDEPENDENT_AMBULATORY_CARE_PROVIDER_SITE_OTHER): Payer: Commercial Managed Care - PPO | Admitting: *Deleted

## 2019-04-06 DIAGNOSIS — I4811 Longstanding persistent atrial fibrillation: Secondary | ICD-10-CM | POA: Diagnosis not present

## 2019-04-06 DIAGNOSIS — Z5181 Encounter for therapeutic drug level monitoring: Secondary | ICD-10-CM

## 2019-04-06 DIAGNOSIS — I482 Chronic atrial fibrillation, unspecified: Secondary | ICD-10-CM | POA: Diagnosis not present

## 2019-04-06 LAB — POCT INR: INR: 4.2 — AB (ref 2.0–3.0)

## 2019-04-06 NOTE — Patient Instructions (Signed)
Hold coumadin tonight then decrease dose to 2 tablets daily Recheck in 2 weeks

## 2019-04-25 ENCOUNTER — Other Ambulatory Visit: Payer: Self-pay

## 2019-04-25 ENCOUNTER — Ambulatory Visit (INDEPENDENT_AMBULATORY_CARE_PROVIDER_SITE_OTHER): Payer: Commercial Managed Care - PPO | Admitting: *Deleted

## 2019-04-25 DIAGNOSIS — I4821 Permanent atrial fibrillation: Secondary | ICD-10-CM

## 2019-04-25 DIAGNOSIS — Z5181 Encounter for therapeutic drug level monitoring: Secondary | ICD-10-CM

## 2019-04-25 DIAGNOSIS — I4811 Longstanding persistent atrial fibrillation: Secondary | ICD-10-CM

## 2019-04-25 LAB — POCT INR: INR: 3 (ref 2.0–3.0)

## 2019-04-25 NOTE — Patient Instructions (Signed)
Continue coumadin 2 tablets daily Recheck in 3 weeks

## 2019-05-14 ENCOUNTER — Telehealth: Payer: Self-pay | Admitting: Pharmacist

## 2019-05-14 NOTE — Telephone Encounter (Signed)
Called pt to discuss changing from warfarin to Kevil therapy due to better efficacy and safety data, as well as decreased office visits especially in the setting of COVID-19 pandemic.  Left message for pt. Looks as though she has previously discussed with MD and was not interested, however she has Pharmacist, community so Eliquis copay would only be $10 per month. Will await pt's return call and discuss benefits of changing therapy again.

## 2019-05-15 NOTE — Telephone Encounter (Signed)
Spoke with pt who is interested in changing to Eliquis. She would like to discuss with Lattie Haw tomorrow at her INR check. She would qualify for Eliquis 5mg  BID and has commercial insurance - we could activate a copay card to bring her monthly copay down to $10.

## 2019-05-16 ENCOUNTER — Ambulatory Visit (INDEPENDENT_AMBULATORY_CARE_PROVIDER_SITE_OTHER): Payer: Commercial Managed Care - PPO | Admitting: *Deleted

## 2019-05-16 ENCOUNTER — Other Ambulatory Visit: Payer: Self-pay

## 2019-05-16 DIAGNOSIS — I4891 Unspecified atrial fibrillation: Secondary | ICD-10-CM | POA: Diagnosis not present

## 2019-05-16 DIAGNOSIS — I4811 Longstanding persistent atrial fibrillation: Secondary | ICD-10-CM

## 2019-05-16 DIAGNOSIS — Z5181 Encounter for therapeutic drug level monitoring: Secondary | ICD-10-CM | POA: Diagnosis not present

## 2019-05-16 LAB — POCT INR: INR: 6.9 — AB (ref 2.0–3.0)

## 2019-05-16 NOTE — Telephone Encounter (Signed)
Pt is not interested in starting Eliquis at this time.  Prefers to stay on Warfarin.

## 2019-05-16 NOTE — Patient Instructions (Signed)
Hold warfarin x 4 days then decrease dose to 2 tablets daily except 1 tablet on Mondays, Wednesdays and Fridays Recheck in 1 week

## 2019-05-21 NOTE — Telephone Encounter (Addendum)
Please see previous note - her insurance may cover Eliquis to a $70 copay but she has a commercial plan and we can activate a copay card that would make the monthly cost only $10.   I tried calling pt again and she did not answer, please discuss change to Eliquis at appt this week. I can help activate $10 copay card if needed.

## 2019-05-24 ENCOUNTER — Other Ambulatory Visit: Payer: Self-pay

## 2019-05-24 ENCOUNTER — Ambulatory Visit (INDEPENDENT_AMBULATORY_CARE_PROVIDER_SITE_OTHER): Payer: Commercial Managed Care - PPO | Admitting: *Deleted

## 2019-05-24 DIAGNOSIS — Z5181 Encounter for therapeutic drug level monitoring: Secondary | ICD-10-CM | POA: Diagnosis not present

## 2019-05-24 DIAGNOSIS — I4811 Longstanding persistent atrial fibrillation: Secondary | ICD-10-CM

## 2019-05-24 DIAGNOSIS — I4891 Unspecified atrial fibrillation: Secondary | ICD-10-CM | POA: Diagnosis not present

## 2019-05-24 LAB — POCT INR: INR: 1.8 — AB (ref 2.0–3.0)

## 2019-05-24 NOTE — Patient Instructions (Signed)
Increase warfarin to 2 tablets daily except 1 tablet on Mondays and Fridays Recheck in 1 week Not interested in changing to Eliquis at this time.

## 2019-06-04 ENCOUNTER — Other Ambulatory Visit: Payer: Self-pay

## 2019-06-04 ENCOUNTER — Ambulatory Visit (INDEPENDENT_AMBULATORY_CARE_PROVIDER_SITE_OTHER): Payer: Commercial Managed Care - PPO | Admitting: *Deleted

## 2019-06-04 DIAGNOSIS — I4891 Unspecified atrial fibrillation: Secondary | ICD-10-CM | POA: Diagnosis not present

## 2019-06-04 DIAGNOSIS — Z5181 Encounter for therapeutic drug level monitoring: Secondary | ICD-10-CM

## 2019-06-04 DIAGNOSIS — I4811 Longstanding persistent atrial fibrillation: Secondary | ICD-10-CM

## 2019-06-04 LAB — POCT INR: INR: 3 (ref 2.0–3.0)

## 2019-06-04 NOTE — Patient Instructions (Signed)
Decrease dose to 2 tablets daily except 1 tablet on Mondays, Wednesdays and Fridays Recheck in 2 week Not interested in changing to Eliquis at this time.

## 2019-06-20 ENCOUNTER — Other Ambulatory Visit: Payer: Self-pay

## 2019-06-20 ENCOUNTER — Ambulatory Visit (INDEPENDENT_AMBULATORY_CARE_PROVIDER_SITE_OTHER): Payer: Commercial Managed Care - PPO | Admitting: *Deleted

## 2019-06-20 DIAGNOSIS — I4811 Longstanding persistent atrial fibrillation: Secondary | ICD-10-CM | POA: Diagnosis not present

## 2019-06-20 DIAGNOSIS — I4891 Unspecified atrial fibrillation: Secondary | ICD-10-CM

## 2019-06-20 DIAGNOSIS — Z5181 Encounter for therapeutic drug level monitoring: Secondary | ICD-10-CM

## 2019-06-20 LAB — POCT INR: INR: 2.7 (ref 2.0–3.0)

## 2019-06-20 NOTE — Patient Instructions (Signed)
Continue warfarin 2 tablets daily except 1 tablet on Mondays, Wednesdays and Fridays Recheck in 3 week Still not interested in changing to Eliquis even with co-pay card.

## 2019-07-10 ENCOUNTER — Other Ambulatory Visit: Payer: Self-pay | Admitting: Cardiology

## 2019-07-18 ENCOUNTER — Other Ambulatory Visit: Payer: Self-pay

## 2019-07-18 ENCOUNTER — Ambulatory Visit (INDEPENDENT_AMBULATORY_CARE_PROVIDER_SITE_OTHER): Payer: Commercial Managed Care - PPO | Admitting: *Deleted

## 2019-07-18 DIAGNOSIS — Z5181 Encounter for therapeutic drug level monitoring: Secondary | ICD-10-CM

## 2019-07-18 DIAGNOSIS — I4811 Longstanding persistent atrial fibrillation: Secondary | ICD-10-CM

## 2019-07-18 LAB — POCT INR: INR: 2.7 (ref 2.0–3.0)

## 2019-07-18 NOTE — Patient Instructions (Signed)
Continue warfarin 2 tablets daily except 1 tablet on Mondays, Wednesdays and Fridays Recheck in 4 weeks Still not interested in changing to Eliquis even with co-pay card.

## 2019-09-17 ENCOUNTER — Other Ambulatory Visit: Payer: Self-pay | Admitting: *Deleted

## 2019-09-17 MED ORDER — FLECAINIDE ACETATE 100 MG PO TABS: ORAL_TABLET | ORAL | 1 refills | Status: DC

## 2019-09-18 ENCOUNTER — Telehealth: Payer: Self-pay | Admitting: Cardiology

## 2019-09-18 ENCOUNTER — Ambulatory Visit: Payer: Self-pay | Admitting: *Deleted

## 2019-09-18 NOTE — Telephone Encounter (Signed)
Spoke with patient.  She is good with Dr Rosann Auerbach managing her coumadin.  Will be d/c from our coumadin clinic.

## 2019-09-18 NOTE — Telephone Encounter (Signed)
Called to state that her PCP wants to start monitoring her CCR

## 2019-09-18 NOTE — Telephone Encounter (Signed)
LM for pt to call back.

## 2020-03-19 ENCOUNTER — Encounter (INDEPENDENT_AMBULATORY_CARE_PROVIDER_SITE_OTHER): Payer: Self-pay | Admitting: Gastroenterology

## 2020-03-28 ENCOUNTER — Other Ambulatory Visit: Payer: Self-pay | Admitting: Cardiology

## 2020-04-07 ENCOUNTER — Ambulatory Visit (INDEPENDENT_AMBULATORY_CARE_PROVIDER_SITE_OTHER): Payer: Commercial Managed Care - PPO | Admitting: Gastroenterology

## 2020-04-09 ENCOUNTER — Ambulatory Visit (INDEPENDENT_AMBULATORY_CARE_PROVIDER_SITE_OTHER): Payer: Commercial Managed Care - PPO | Admitting: Gastroenterology

## 2020-05-05 ENCOUNTER — Other Ambulatory Visit: Payer: Self-pay

## 2020-05-05 ENCOUNTER — Ambulatory Visit (INDEPENDENT_AMBULATORY_CARE_PROVIDER_SITE_OTHER): Payer: Commercial Managed Care - PPO | Admitting: Gastroenterology

## 2020-05-05 ENCOUNTER — Encounter (INDEPENDENT_AMBULATORY_CARE_PROVIDER_SITE_OTHER): Payer: Self-pay | Admitting: Gastroenterology

## 2020-05-05 DIAGNOSIS — R188 Other ascites: Secondary | ICD-10-CM | POA: Diagnosis not present

## 2020-05-05 DIAGNOSIS — K7460 Unspecified cirrhosis of liver: Secondary | ICD-10-CM | POA: Insufficient documentation

## 2020-05-05 DIAGNOSIS — R110 Nausea: Secondary | ICD-10-CM | POA: Diagnosis not present

## 2020-05-05 DIAGNOSIS — I8510 Secondary esophageal varices without bleeding: Secondary | ICD-10-CM | POA: Insufficient documentation

## 2020-05-05 MED ORDER — SPIRONOLACTONE 100 MG PO TABS: 100.0000 mg | ORAL_TABLET | Freq: Every day | ORAL | 0 refills | Status: DC
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
## 2020-05-05 NOTE — Patient Instructions (Addendum)
Schedule EGD Schedule liver US Perform blood workup Reduce salt intake to <2 g per day Can take Tylenol max of 2 g per day (650 mg q8h) for pain Avoid NSAIDs for pain Avoid eating raw oysters or shellfish Stop taking potassium pills

## 2020-05-06 LAB — COMPREHENSIVE METABOLIC PANEL
AG Ratio: 0.7 (calc) — ABNORMAL LOW (ref 1.0–2.5)
ALT: 36 U/L — ABNORMAL HIGH (ref 6–29)
AST: 62 U/L — ABNORMAL HIGH (ref 10–35)
Albumin: 3 g/dL — ABNORMAL LOW (ref 3.6–5.1)
Alkaline phosphatase (APISO): 120 U/L (ref 37–153)
BUN: 9 mg/dL (ref 7–25)
CO2: 28 mmol/L (ref 20–32)
Calcium: 9.8 mg/dL (ref 8.6–10.4)
Chloride: 107 mmol/L (ref 98–110)
Creat: 0.68 mg/dL (ref 0.50–0.99)
Globulin: 4.6 g/dL (calc) — ABNORMAL HIGH (ref 1.9–3.7)
Glucose, Bld: 161 mg/dL — ABNORMAL HIGH (ref 65–139)
Potassium: 4.1 mmol/L (ref 3.5–5.3)
Sodium: 139 mmol/L (ref 135–146)
Total Bilirubin: 1.4 mg/dL — ABNORMAL HIGH (ref 0.2–1.2)
Total Protein: 7.6 g/dL (ref 6.1–8.1)

## 2020-05-06 LAB — HEPATITIS C ANTIBODY
Hepatitis C Ab: NONREACTIVE
SIGNAL TO CUT-OFF: 0.09 (ref <1.00)

## 2020-05-06 LAB — CBC WITH DIFFERENTIAL/PLATELET
Absolute Monocytes: 408 cells/uL (ref 200–950)
Basophils Absolute: 32 cells/uL (ref 0–200)
Basophils Relative: 0.6 %
Eosinophils Absolute: 90 cells/uL (ref 15–500)
Eosinophils Relative: 1.7 %
HCT: 33.8 % — ABNORMAL LOW (ref 35.0–45.0)
Hemoglobin: 11 g/dL — ABNORMAL LOW (ref 11.7–15.5)
Lymphs Abs: 800 cells/uL — ABNORMAL LOW (ref 850–3900)
MCH: 28.6 pg (ref 27.0–33.0)
MCHC: 32.5 g/dL (ref 32.0–36.0)
MCV: 87.8 fL (ref 80.0–100.0)
MPV: 12 fL (ref 7.5–12.5)
Monocytes Relative: 7.7 %
Neutro Abs: 3970 cells/uL (ref 1500–7800)
Neutrophils Relative %: 74.9 %
Platelets: 97 10*3/uL — ABNORMAL LOW (ref 140–400)
RBC: 3.85 10*6/uL (ref 3.80–5.10)
RDW: 19.1 % — ABNORMAL HIGH (ref 11.0–15.0)
Total Lymphocyte: 15.1 %
WBC: 5.3 10*3/uL (ref 3.8–10.8)

## 2020-05-06 LAB — PROTIME-INR
INR: 1.4 — ABNORMAL HIGH
Prothrombin Time: 14.3 s — ABNORMAL HIGH (ref 9.0–11.5)

## 2020-05-06 LAB — HEPATITIS B SURFACE ANTIGEN: Hepatitis B Surface Ag: NONREACTIVE

## 2020-05-06 LAB — AFP TUMOR MARKER: AFP-Tumor Marker: 2.2 ng/mL

## 2020-05-06 LAB — IRON, TOTAL/TOTAL IRON BINDING CAP
%SAT: 31 % (calc) (ref 16–45)
Iron: 81 ug/dL (ref 45–160)
TIBC: 264 mcg/dL (calc) (ref 250–450)

## 2020-05-06 LAB — HEPATITIS A ANTIBODY, TOTAL: Hepatitis A AB,Total: NONREACTIVE

## 2020-05-06 LAB — HEPATITIS B SURFACE ANTIBODY,QUALITATIVE: Hep B S Ab: REACTIVE — AB

## 2020-05-06 LAB — FERRITIN: Ferritin: 438 ng/mL — ABNORMAL HIGH (ref 16–288)

## 2020-05-06 LAB — IGG: IgG (Immunoglobin G), Serum: 2915 mg/dL — ABNORMAL HIGH (ref 600–1540)

## 2020-05-06 LAB — HEPATITIS B CORE ANTIBODY, TOTAL: Hep B Core Total Ab: NONREACTIVE

## 2020-05-08 ENCOUNTER — Other Ambulatory Visit (INDEPENDENT_AMBULATORY_CARE_PROVIDER_SITE_OTHER): Payer: Self-pay

## 2020-05-08 ENCOUNTER — Telehealth (INDEPENDENT_AMBULATORY_CARE_PROVIDER_SITE_OTHER): Payer: Self-pay

## 2020-05-08 NOTE — Telephone Encounter (Signed)
Dr [name] per EGD w/[facility] can Chelsea [name] Dob [id number] stop her Warfarin she is scheduled Friday 06/27/20, please advise?

## 2020-05-09 NOTE — Telephone Encounter (Signed)
Thank you will send this to Dr. Garner Nash

## 2020-05-09 NOTE — Telephone Encounter (Signed)
Is this a pharmacy clearance only? Not a cardiac/medical clearance, right? We haven't seen Mrs. Chelsea [name] for more than a year. If this is pharmacy clearance only, it will have to go to her own coumadin clinic. Her last coumadin clinic visit with [place] was in February, since then she has switched to Dr. Rosann Auerbach coumadin clinic based on phone conversation from 09/18/2019, please send the request to her coumadin clinic. Thank you

## 2020-05-15 ENCOUNTER — Other Ambulatory Visit (HOSPITAL_COMMUNITY): Payer: Commercial Managed Care - PPO

## 2020-05-16 ENCOUNTER — Encounter (INDEPENDENT_AMBULATORY_CARE_PROVIDER_SITE_OTHER): Payer: Self-pay

## 2020-05-16 ENCOUNTER — Telehealth (INDEPENDENT_AMBULATORY_CARE_PROVIDER_SITE_OTHER): Payer: Self-pay

## 2020-05-16 ENCOUNTER — Other Ambulatory Visit (INDEPENDENT_AMBULATORY_CARE_PROVIDER_SITE_OTHER): Payer: Self-pay

## 2020-05-16 DIAGNOSIS — K7460 Unspecified cirrhosis of liver: Secondary | ICD-10-CM

## 2020-05-16 NOTE — Telephone Encounter (Signed)
Dr. Donzetta Sprung states he will handle [place] Lovenox [place] prior to her EGD scheduled on 06/27/20 with Dr Levon Hedger

## 2020-05-18 NOTE — Telephone Encounter (Signed)
Thanks

## 2020-05-21 ENCOUNTER — Ambulatory Visit (HOSPITAL_COMMUNITY): Payer: Commercial Managed Care - PPO

## 2020-05-23 ENCOUNTER — Other Ambulatory Visit (INDEPENDENT_AMBULATORY_CARE_PROVIDER_SITE_OTHER): Payer: Self-pay | Admitting: Gastroenterology

## 2020-05-23 DIAGNOSIS — K7460 Unspecified cirrhosis of liver: Secondary | ICD-10-CM

## 2020-05-26 NOTE — Addendum Note (Signed)
Addended by: Dolores Frame on: 05/26/2020 08:43 AM   Modules accepted: Orders

## 2020-06-24 NOTE — Patient Instructions (Signed)
TIAJAH OYSTER  06/24/2020     @PREFPERIOPPHARMACY @   Your procedure is scheduled on  06/27/2020.  Report to 14/09/2019 at  [phone]  A.M.  Call this number if you have problems the morning of surgery:  [phone]   Remember:  Follow the diet instructions given to you by the office.                      Take these medicines the morning of surgery with A SIP OF WATER  Diltiazem ,flecanide. DO NOT take any medications for diabetes the morning of your procedure.    Do not wear jewelry, make-up or nail polish.  Do not wear lotions, powders, or perfumes. Please wear deodorant and brush your teeth.  Do not shave 48 hours prior to surgery.  Men may shave face and neck.  Do not bring valuables to the hospital.  [facility] is not responsible for any belongings or valuables.  Contacts, dentures or bridgework may not be worn into surgery.  Leave your suitcase in the car.  After surgery it may be brought to your room.  For patients admitted to the hospital, discharge time will be determined by your treatment team.  Patients discharged the day of surgery will not be allowed to drive home.   Name and phone number of your driver:   Family   Special instructions:  DO NOT smoke the morning of your procedure.  Please read over the following fact sheets that you were given. Anesthesia Post-op Instructions and Care and Recovery After Surgery       Upper Endoscopy, Adult, Care After This sheet gives you information about how to care for yourself after your procedure. Your health care provider may also give you more specific instructions. If you have problems or questions, contact your health care provider. What can I expect after the procedure? After the procedure, it is common to have:  A sore throat.  Mild stomach pain or discomfort.  Bloating.  Nausea. Follow these instructions at home:   Follow instructions from your health care provider about what to eat or drink after  your procedure.  Return to your normal activities as told by your health care provider. Ask your health care provider what activities are safe for you.  Take over-the-counter and prescription medicines only as told by your health care provider.  Do not drive for 24 hours if you were given a sedative during your procedure.  Keep all follow-up visits as told by your health care provider. This is important. Contact a health care provider if you have:  A sore throat that lasts longer than one day.  Trouble swallowing. Get help right away if:  You vomit blood or your vomit looks like coffee grounds.  You have: ? A fever. ? Bloody, black, or tarry stools. ? A severe sore throat or you cannot swallow. ? Difficulty breathing. ? Severe pain in your chest or abdomen. Summary  After the procedure, it is common to have a sore throat, mild stomach discomfort, bloating, and nausea.  Do not drive for 24 hours if you were given a sedative during the procedure.  Follow instructions from your health care provider about what to eat or drink after your procedure.  Return to your normal activities as told by your health care provider. This information is not intended to replace advice given to you by your health care provider. Make sure you discuss any  questions you have with your health care provider. Document Revised: 01/03/2018 Document Reviewed: 12/12/2017 Elsevier Patient Education  2020 [employer] Monitored Anesthesia Care, Care After These instructions provide you with information about caring for yourself after your procedure. Your health care provider may also give you more specific instructions. Your treatment has been planned according to current medical practices, but problems sometimes occur. Call your health care provider if you have any problems or questions after your procedure. What can I expect after the procedure? After your procedure, you may:  Feel sleepy for several  hours.  Feel clumsy and have poor balance for several hours.  Feel forgetful about what happened after the procedure.  Have poor judgment for several hours.  Feel nauseous or vomit.  Have a sore throat if you had a breathing tube during the procedure. Follow these instructions at home: For at least 24 hours after the procedure:      Have a responsible adult stay with you. It is important to have someone help care for you until you are awake and alert.  Rest as needed.  Do not: ? Participate in activities in which you could fall or become injured. ? Drive. ? Use heavy machinery. ? Drink alcohol. ? Take sleeping pills or medicines that cause drowsiness. ? Make important decisions or sign legal documents. ? Take care of children on your own. Eating and drinking  Follow the diet that is recommended by your health care provider.  If you vomit, drink water, juice, or soup when you can drink without vomiting.  Make sure you have little or no nausea before eating solid foods. General instructions  Take over-the-counter and prescription medicines only as told by your health care provider.  If you have sleep apnea, surgery and certain medicines can increase your risk for breathing problems. Follow instructions from your health care provider about wearing your sleep device: ? Anytime you are sleeping, including during daytime naps. ? While taking prescription pain medicines, sleeping medicines, or medicines that make you drowsy.  If you smoke, do not smoke without supervision.  Keep all follow-up visits as told by your health care provider. This is important. Contact a health care provider if:  You keep feeling nauseous or you keep vomiting.  You feel light-headed.  You develop a rash.  You have a fever. Get help right away if:  You have trouble breathing. Summary  For several hours after your procedure, you may feel sleepy and have poor judgment.  Have a  responsible adult stay with you for at least 24 hours or until you are awake and alert. This information is not intended to replace advice given to you by your health care provider. Make sure you discuss any questions you have with your health care provider. Document Revised: 10/10/2017 Document Reviewed: 11/02/2015 Elsevier Patient Education  2020 [employer].

## 2020-06-25 ENCOUNTER — Other Ambulatory Visit: Payer: Self-pay

## 2020-06-25 ENCOUNTER — Encounter (HOSPITAL_COMMUNITY): Payer: Self-pay

## 2020-06-25 ENCOUNTER — Other Ambulatory Visit (HOSPITAL_COMMUNITY)
Admission: RE | Admit: 2020-06-25 | Discharge: 2020-06-25 | Disposition: A | Discharge status: Home / Self Care | Payer: Commercial Managed Care - PPO | Source: Ambulatory Visit | Attending: Gastroenterology | Admitting: Gastroenterology

## 2020-06-25 ENCOUNTER — Encounter (HOSPITAL_COMMUNITY)
Admission: RE | Admit: 2020-06-25 | Discharge: 2020-06-25 | Disposition: A | Discharge status: Home / Self Care | Payer: Commercial Managed Care - PPO | Source: Ambulatory Visit | Attending: Gastroenterology | Admitting: Gastroenterology

## 2020-06-25 DIAGNOSIS — R188 Other ascites: Secondary | ICD-10-CM | POA: Diagnosis not present

## 2020-06-25 DIAGNOSIS — K7460 Unspecified cirrhosis of liver: Secondary | ICD-10-CM | POA: Insufficient documentation

## 2020-06-25 DIAGNOSIS — Z01818 Encounter for other preprocedural examination: Secondary | ICD-10-CM | POA: Diagnosis not present

## 2020-06-25 DIAGNOSIS — Z20822 Contact with and (suspected) exposure to covid-19: Secondary | ICD-10-CM | POA: Insufficient documentation

## 2020-06-25 LAB — CBC WITH DIFFERENTIAL/PLATELET
Abs Immature Granulocytes: 0 10*3/uL (ref 0.00–0.07)
Basophils Absolute: 0 10*3/uL (ref 0.0–0.1)
Basophils Relative: 1 %
Eosinophils Absolute: 0.1 10*3/uL (ref 0.0–0.5)
Eosinophils Relative: 3 %
HCT: 32.2 % — ABNORMAL LOW (ref 36.0–46.0)
Hemoglobin: 10.6 g/dL — ABNORMAL LOW (ref 12.0–15.0)
Immature Granulocytes: 0 %
Lymphocytes Relative: 23 %
Lymphs Abs: 0.9 10*3/uL (ref 0.7–4.0)
MCH: 30 pg (ref 26.0–34.0)
MCHC: 32.9 g/dL (ref 30.0–36.0)
MCV: 91.2 fL (ref 80.0–100.0)
Monocytes Absolute: 0.4 10*3/uL (ref 0.1–1.0)
Monocytes Relative: 12 %
Neutro Abs: 2.4 10*3/uL (ref 1.7–7.7)
Neutrophils Relative %: 61 %
Platelets: 70 10*3/uL — ABNORMAL LOW (ref 150–400)
RBC: 3.53 MIL/uL — ABNORMAL LOW (ref 3.87–5.11)
RDW: 16.9 % — ABNORMAL HIGH (ref 11.5–15.5)
WBC: 3.8 10*3/uL — ABNORMAL LOW (ref 4.0–10.5)
nRBC: 0 % (ref 0.0–0.2)

## 2020-06-25 LAB — BASIC METABOLIC PANEL
Anion gap: 5 (ref 5–15)
BUN: 12 mg/dL (ref 8–23)
CO2: 24 mmol/L (ref 22–32)
Calcium: 9.5 mg/dL (ref 8.9–10.3)
Chloride: 106 mmol/L (ref 98–111)
Creatinine, Ser: 0.87 mg/dL (ref 0.44–1.00)
GFR, Estimated: >60 mL/min (ref >60)
Glucose, Bld: 214 mg/dL — ABNORMAL HIGH (ref 70–99)
Potassium: 3.7 mmol/L (ref 3.5–5.1)
Sodium: 135 mmol/L (ref 135–145)

## 2020-06-25 LAB — PROTIME-INR
INR: 1.4 — ABNORMAL HIGH (ref 0.8–1.2)
Prothrombin Time: 17 seconds — ABNORMAL HIGH (ref 11.4–15.2)

## 2020-06-26 ENCOUNTER — Other Ambulatory Visit: Payer: Self-pay | Admitting: Cardiology

## 2020-06-26 LAB — SARS CORONAVIRUS 2 (TAT 6-24 HRS): SARS Coronavirus 2: NEGATIVE

## 2020-06-27 ENCOUNTER — Encounter (HOSPITAL_COMMUNITY)
Admission: RE | Disposition: A | Discharge status: Home / Self Care | Payer: Self-pay | Source: Home / Self Care | Attending: Gastroenterology

## 2020-06-27 ENCOUNTER — Ambulatory Visit (HOSPITAL_COMMUNITY): Payer: Commercial Managed Care - PPO

## 2020-06-27 ENCOUNTER — Ambulatory Visit (HOSPITAL_COMMUNITY)
Admission: RE | Admit: 2020-06-27 | Discharge: 2020-06-27 | Disposition: A | Discharge status: Home / Self Care | Payer: Commercial Managed Care - PPO | Attending: Gastroenterology | Admitting: Gastroenterology

## 2020-06-27 ENCOUNTER — Encounter (HOSPITAL_COMMUNITY): Payer: Self-pay | Admitting: Gastroenterology

## 2020-06-27 ENCOUNTER — Encounter (INDEPENDENT_AMBULATORY_CARE_PROVIDER_SITE_OTHER): Payer: Self-pay

## 2020-06-27 ENCOUNTER — Other Ambulatory Visit (INDEPENDENT_AMBULATORY_CARE_PROVIDER_SITE_OTHER): Payer: Self-pay | Admitting: Gastroenterology

## 2020-06-27 DIAGNOSIS — I10 Essential (primary) hypertension: Secondary | ICD-10-CM | POA: Diagnosis not present

## 2020-06-27 DIAGNOSIS — K7581 Nonalcoholic steatohepatitis (NASH): Secondary | ICD-10-CM

## 2020-06-27 DIAGNOSIS — E119 Type 2 diabetes mellitus without complications: Secondary | ICD-10-CM | POA: Diagnosis not present

## 2020-06-27 DIAGNOSIS — K7460 Unspecified cirrhosis of liver: Secondary | ICD-10-CM | POA: Insufficient documentation

## 2020-06-27 DIAGNOSIS — I8510 Secondary esophageal varices without bleeding: Secondary | ICD-10-CM | POA: Insufficient documentation

## 2020-06-27 DIAGNOSIS — K766 Portal hypertension: Secondary | ICD-10-CM | POA: Diagnosis not present

## 2020-06-27 DIAGNOSIS — K31819 Angiodysplasia of stomach and duodenum without bleeding: Secondary | ICD-10-CM | POA: Insufficient documentation

## 2020-06-27 DIAGNOSIS — Z91030 Bee allergy status: Secondary | ICD-10-CM | POA: Diagnosis not present

## 2020-06-27 DIAGNOSIS — Z13810 Encounter for screening for upper gastrointestinal disorder: Secondary | ICD-10-CM | POA: Diagnosis not present

## 2020-06-27 DIAGNOSIS — Z7984 Long term (current) use of oral hypoglycemic drugs: Secondary | ICD-10-CM | POA: Diagnosis not present

## 2020-06-27 DIAGNOSIS — Z79899 Other long term (current) drug therapy: Secondary | ICD-10-CM | POA: Insufficient documentation

## 2020-06-27 DIAGNOSIS — Z888 Allergy status to other drugs, medicaments and biological substances status: Secondary | ICD-10-CM | POA: Insufficient documentation

## 2020-06-27 DIAGNOSIS — K3189 Other diseases of stomach and duodenum: Secondary | ICD-10-CM | POA: Diagnosis not present

## 2020-06-27 HISTORY — PX: HOT HEMOSTASIS: SHX5433

## 2020-06-27 HISTORY — PX: ESOPHAGOGASTRODUODENOSCOPY (EGD) WITH PROPOFOL: SHX5813

## 2020-06-27 LAB — GLUCOSE, CAPILLARY
Glucose-Capillary: 108 mg/dL — ABNORMAL HIGH (ref 70–99)
Glucose-Capillary: 105 mg/dL — ABNORMAL HIGH (ref 70–99)

## 2020-06-27 SURGERY — ESOPHAGOGASTRODUODENOSCOPY (EGD) WITH PROPOFOL
Anesthesia: General

## 2020-06-27 MED ORDER — CHLORHEXIDINE GLUCONATE CLOTH 2 % EX PADS: 6.0000 | MEDICATED_PAD | Freq: Once | CUTANEOUS | Status: DC

## 2020-06-27 MED ORDER — GLYCOPYRROLATE 0.2 MG/ML IJ SOLN
0.2000 mg | Freq: Once | INTRAMUSCULAR | Status: AC
  Administered 2020-06-27: 0.2 mg via INTRAVENOUS

## 2020-06-27 MED ORDER — PROPOFOL 10 MG/ML IV BOLUS
INTRAVENOUS | Status: AC
  Filled 2020-06-27: qty 60

## 2020-06-27 MED ORDER — LIDOCAINE HCL (CARDIAC) PF 100 MG/5ML IV SOSY
PREFILLED_SYRINGE | INTRAVENOUS | Status: DC | PRN
  Administered 2020-06-27: 50 mg via INTRAVENOUS

## 2020-06-27 MED ORDER — LACTATED RINGERS IV SOLN
INTRAVENOUS | Status: DC
  Administered 2020-06-27: 1000 mL via INTRAVENOUS

## 2020-06-27 MED ORDER — LIDOCAINE VISCOUS HCL 2 % MT SOLN
15.0000 mL | Freq: Once | OROMUCOSAL | Status: AC
  Administered 2020-06-27: 15 mL via OROMUCOSAL

## 2020-06-27 MED ORDER — PROPOFOL 10 MG/ML IV BOLUS
INTRAVENOUS | Status: DC | PRN
  Administered 2020-06-27: 50 mg via INTRAVENOUS
  Administered 2020-06-27: 80 mg via INTRAVENOUS
  Administered 2020-06-27 (×7): 50 mg via INTRAVENOUS

## 2020-06-27 MED ORDER — OMEPRAZOLE 40 MG PO CPDR: 40.0000 mg | DELAYED_RELEASE_CAPSULE | Freq: Two times a day (BID) | ORAL | 0 refills | Status: DC

## 2020-06-27 MED ORDER — GLYCOPYRROLATE 0.2 MG/ML IJ SOLN
INTRAMUSCULAR | Status: AC
  Filled 2020-06-27: qty 1

## 2020-06-27 MED ORDER — LIDOCAINE HCL (PF) 2 % IJ SOLN
INTRAMUSCULAR | Status: AC
  Filled 2020-06-27: qty 5

## 2020-06-27 NOTE — Progress Notes (Signed)
Thanks

## 2020-06-27 NOTE — Op Note (Addendum)
[facility] Patient Name: Chelsea AlaminFlora [name] Procedure Date: 06/27/2020 7:08 AM MRN: [id number] Date of Birth: 1955-12-19 Attending MD: Katrinka Blazinganiel Castaneda ,  CSN: [id number] Age: [age] Admit Type: Outpatient Procedure:                Upper GI endoscopy Indications:              Cirrhosis rule out esophageal varices Providers:                Katrinka Blazinganiel Castaneda, Criselda PeachesLurae B. Patsy LagerAlbert RN, RN, Pandora LeiterNeville                            David, Technician Referring MD:              Medicines:                Monitored Anesthesia Care Complications:            No immediate complications. Estimated Blood Loss:     Estimated blood loss: none. Procedure:                Pre-Anesthesia Assessment:                           - Prior to the procedure, a History and Physical                            was performed, and patient medications, allergies                            and sensitivities were reviewed. The patient's                            tolerance of previous anesthesia was reviewed.                           - The risks and benefits of the procedure and the                            sedation options and risks were discussed with the                            patient. All questions were answered and informed                            consent was obtained.                           - ASA Grade Assessment: III - A patient with severe                            systemic disease.                           After obtaining informed consent, the endoscope was                            passed under direct vision. Throughout the  procedure, the patient's blood pressure, pulse, and                            oxygen saturations were monitored continuously. The                            [id number]-[id number] ([id number]) scope was introduced through the                            mouth, and advanced to the second part of duodenum.                            The upper GI endoscopy was accomplished without                             difficulty. The patient tolerated the procedure                            well. Scope In: 7:40:16 AM Scope Out: 8:05:17 AM Total Procedure Duration: 0 hours 25 minutes 1 second  Findings:      Mild portal hypertensive gastropathy was found in the entire examined       stomach.      Three columns of grade III varices were found in the lower third of the       esophagus. Three bands were successfully placed with complete       eradication, resulting in deflation of varices. There was no bleeding       during the maneuver.      A single 4 mm angiodysplastic lesion with no bleeding was found in the       gastric antrum. Coagulation for bleeding prevention using argon plasma       at 0.3 liters/minute and 20 watts was successful.      The examined duodenum was normal. Impression:               - Grade III esophageal varices. Completely                            eradicated. Banded.                           - Portal hypertensive gastropathy.                           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 Treated with argon plasma coagulation                            (APC).                           - Normal examined duodenum.                           - No specimens collected. Moderate Sedation:  Per Anesthesia Care Recommendation:           - Discharge patient to home (ambulatory).                           - Resume previous diet.                           - Resume Coumadin (warfarin) at prior dose                            tomorrow. Refer to Coumadin Clinic for further                            adjustment of therapy.                           - Use Prilosec (omeprazole) 40 mg PO BID for 1                            month.                           - Repeat upper endoscopy in 8 weeks for                            surveillance. Procedure Code(s):        --- Professional ---                            [id number], Esophagogastroduodenoscopy, flexible,                            transoral; with band ligation of esophageal/gastric                            varices                           43255, 59, Esophagogastroduodenoscopy, flexible,                            transoral; with control of bleeding, any method Diagnosis Code(s):        --- Professional ---                           K74.60, Unspecified cirrhosis of liver                           I85.10, Secondary esophageal varices without                            bleeding                           K76.6, Portal hypertension                           K31.89, Other diseases  of stomach and duodenum                           K31.819, Angiodysplasia of stomach and duodenum                            without bleeding CPT copyright 2019 American Medical Association. All rights reserved. The codes documented in this report are preliminary and upon coder review may  be revised to meet current compliance requirements. [name], MD [name],  06/27/2020 8:16:51 AM This report has been signed electronically. Number of Addenda: 0

## 2020-06-27 NOTE — Discharge Instructions (Signed)
You are being discharged to home.  Resume your previous diet.  Resume taking Coumadin (warfarin) at your prior dose tomorrow.  Follow up with your [facility] for further adjustment of your therapy.  Take Prilosec (omeprazole) 40 mg by mouth twice a day for one month.  Your physician has recommended a repeat upper endoscopy in eight weeks for surveillance.   Upper Endoscopy, Adult, Care After This sheet gives you information about how to care for yourself after your procedure. Your health care provider may also give you more specific instructions. If you have problems or questions, contact your health care provider. What can I expect after the procedure? After the procedure, it is common to have:  A sore throat.  Mild stomach pain or discomfort.  Bloating.  Nausea. Follow these instructions at home:   Follow instructions from your health care provider about what to eat or drink after your procedure.  Return to your normal activities as told by your health care provider. Ask your health care provider what activities are safe for you.  Take over-the-counter and prescription medicines only as told by your health care provider.  Do not drive for 24 hours if you were given a sedative during your procedure.  Keep all follow-up visits as told by your health care provider. This is important. Contact a health care provider if you have:  A sore throat that lasts longer than one day.  Trouble swallowing. Get help right away if:  You vomit blood or your vomit looks like coffee grounds.  You have: ? A fever. ? Bloody, black, or tarry stools. ? A severe sore throat or you cannot swallow. ? Difficulty breathing. ? Severe pain in your chest or abdomen. Summary  After the procedure, it is common to have a sore throat, mild stomach discomfort, bloating, and nausea.  Do not drive for 24 hours if you were given a sedative during the procedure.  Follow instructions from your health  care provider about what to eat or drink after your procedure.  Return to your normal activities as told by your health care provider. This information is not intended to replace advice given to you by your health care provider. Make sure you discuss any questions you have with your health care provider. Document Revised: 01/03/2018 Document Reviewed: 12/12/2017 Elsevier Patient Education  2020 [employer].

## 2020-06-27 NOTE — Transfer of Care (Addendum)
Immediate Anesthesia Transfer of Care Note  Patient: Chelsea [name]  Procedure(s) Performed: ESOPHAGOGASTRODUODENOSCOPY (EGD) WITH PROPOFOL (N/A ) HOT HEMOSTASIS (ARGON PLASMA COAGULATION/BICAP)  Patient Location: PACU  Anesthesia Type:General  Level of Consciousness: awake and alert   Airway & Oxygen Therapy: Patient Spontanous Breathing  Post-op Assessment: Report given to RN and Post -op Vital signs reviewed and stable  Post vital signs: Reviewed and stable  Last Vitals:  Vitals Value Taken Time  BP    Temp    Pulse    Resp    SpO2      Last Pain:  Vitals:   06/27/20 0644  TempSrc: Oral  PainSc: 0-No pain      Patients Stated Pain Goal: 9 (06/27/20 0644)  Complications: No complications documented.

## 2020-06-27 NOTE — Progress Notes (Signed)
Hi Chelsea [name], Please ask her PCP to give the recommendations and bridging for her coumadin for next EGD in 8 weeks.  Thanks,  [name], MD Gastroenterology and Hepatology [facility]

## 2020-06-27 NOTE — Progress Notes (Signed)
Noted, Ill send this to Dr Donzetta Sprung

## 2020-06-27 NOTE — Anesthesia Preprocedure Evaluation (Signed)
Anesthesia Evaluation  Patient identified by MRN, date of birth, ID band Patient awake    Reviewed: Allergy & Precautions, H&P , NPO status , Patient's Chart, lab work & pertinent test results, reviewed documented beta blocker date and time   Airway Mallampati: II  TM Distance: >3 FB Neck ROM: full    Dental no notable dental hx.    Pulmonary sleep apnea ,    Pulmonary exam normal breath sounds clear to auscultation       Cardiovascular Exercise Tolerance: Good hypertension, negative cardio ROS   Rhythm:regular Rate:Normal     Neuro/Psych CVA, No Residual Symptoms negative psych ROS   GI/Hepatic negative GI ROS, Neg liver ROS,   Endo/Other  negative endocrine ROSdiabetes  Renal/GU negative Renal ROS  negative genitourinary   Musculoskeletal   Abdominal   Peds  Hematology  (+) Blood dyscrasia, anemia ,   Anesthesia Other Findings   Reproductive/Obstetrics negative OB ROS                             Anesthesia Physical Anesthesia Plan  ASA: III  Anesthesia Plan: General   Post-op Pain Management:    Induction:   PONV Risk Score and Plan: Propofol infusion  Airway Management Planned:   Additional Equipment:   Intra-op Plan:   Post-operative Plan:   Informed Consent: I have reviewed the patients History and Physical, chart, labs and discussed the procedure including the risks, benefits and alternatives for the proposed anesthesia with the patient or authorized representative who has indicated his/her understanding and acceptance.     Dental Advisory Given  Plan Discussed with: CRNA  Anesthesia Plan Comments:         Anesthesia Quick Evaluation

## 2020-06-27 NOTE — Anesthesia Postprocedure Evaluation (Signed)
Anesthesia Post Note  Patient: Chelsea [name]  Procedure(s) Performed: ESOPHAGOGASTRODUODENOSCOPY (EGD) WITH PROPOFOL (N/A ) HOT HEMOSTASIS (ARGON PLASMA COAGULATION/BICAP)  Patient location during evaluation: PACU Anesthesia Type: General Level of consciousness: awake and oriented Pain management: pain level controlled Vital Signs Assessment: post-procedure vital signs reviewed and stable Respiratory status: spontaneous breathing and respiratory function stable Cardiovascular status: stable Postop Assessment: no apparent nausea or vomiting Anesthetic complications: no   No complications documented.   Last Vitals:  Vitals:   06/27/20 0644  BP: (!) 145/71  Resp: (!) 26  Temp: 36.9 C  SpO2: 99%    Last Pain:  Vitals:   06/27/20 0644  TempSrc: Oral  PainSc: 0-No pain                 Lorin Glass
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
## 2020-07-02 ENCOUNTER — Encounter (HOSPITAL_COMMUNITY): Payer: Self-pay | Admitting: Gastroenterology

## 2020-07-22 ENCOUNTER — Other Ambulatory Visit (INDEPENDENT_AMBULATORY_CARE_PROVIDER_SITE_OTHER): Payer: Self-pay

## 2020-07-25 ENCOUNTER — Other Ambulatory Visit (INDEPENDENT_AMBULATORY_CARE_PROVIDER_SITE_OTHER): Payer: Self-pay | Admitting: Gastroenterology

## 2020-07-25 DIAGNOSIS — K7460 Unspecified cirrhosis of liver: Secondary | ICD-10-CM

## 2020-07-25 DIAGNOSIS — R188 Other ascites: Secondary | ICD-10-CM

## 2020-07-28 ENCOUNTER — Other Ambulatory Visit: Payer: Self-pay | Admitting: Cardiology

## 2020-07-28 NOTE — Telephone Encounter (Signed)
Last seen 05/05/2020 for Cirrhosis by Dr. Levon Hedger.

## 2020-08-06 ENCOUNTER — Telehealth (INDEPENDENT_AMBULATORY_CARE_PROVIDER_SITE_OTHER): Payer: Self-pay | Admitting: *Deleted

## 2020-08-06 NOTE — Telephone Encounter (Signed)
Patient left message - has questions about procedure scheduled - please call [phone]

## 2020-08-07 ENCOUNTER — Telehealth (INDEPENDENT_AMBULATORY_CARE_PROVIDER_SITE_OTHER): Payer: Commercial Managed Care - PPO | Admitting: Gastroenterology

## 2020-08-08 NOTE — Telephone Encounter (Signed)
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

## 2020-08-12 NOTE — Patient Instructions (Signed)
Chelsea Chelsea [name]  08/12/2020     @PREFPERIOPPHARMACY @   Your procedure is scheduled on  08/15/2020.  Report to [facility] at  1000  A.M.  Call this number if you have problems the morning of surgery:  [phone]   Remember:  Follow the diet instructions given to you by the office.                     Take these medicines the morning of surgery with A SIP OF WATER  Cardiazem, flecanide, prilosec. Follow any instructions given to you concerning your coumadin.    Do not wear jewelry, make-up or nail polish.  Do not wear lotions, powders, or perfumes, or deodorant. Please brush your teeth.  Do not shave 48 hours prior to surgery.  Men may shave face and neck.  Do not bring valuables to the hospital.  [name] is not responsible for any belongings or valuables.  Contacts, dentures or bridgework may not be worn into surgery.  Leave your suitcase in the car.  After surgery it may be brought to your room.  For patients admitted to the hospital, discharge time will be determined by your treatment team.  Patients discharged the day of surgery will not be allowed to drive home.   Name and phone number of your driver:   Family    Special instructions:  DO NOT smoke the morning of your procedure.  Please read over the following fact Chelsea [name] that you were given. Anesthesia Post-op Instructions and Care and Recovery After Surgery       Upper Endoscopy, Adult, Care After This sheet gives you information about how to care for yourself after your procedure. Your health care provider may also give you more specific instructions. If you have problems or questions, contact your health care provider. What can I expect after the procedure? After the procedure, it is common to have:  A sore throat.  Mild stomach pain or discomfort.  Bloating.  Nausea. Follow these instructions at home:  Follow instructions from your health care provider about what to eat or drink after  your procedure.  Return to your normal activities as told by your health care provider. Ask your health care provider what activities are safe for you.  Take over-the-counter and prescription medicines only as told by your health care provider.  If you were given a sedative during the procedure, it can affect you for several hours. Do not drive or operate machinery until your health care provider says that it is safe.  Keep all follow-up visits as told by your health care provider. This is important.   Contact a health care provider if you have:  A sore throat that lasts longer than one day.  Trouble swallowing. Get help right away if:  You vomit blood or your vomit looks like coffee grounds.  You have: ? A fever. ? Bloody, black, or tarry stools. ? A severe sore throat or you cannot swallow. ? Difficulty breathing. ? Severe pain in your chest or abdomen. Summary  After the procedure, it is common to have a sore throat, mild stomach discomfort, bloating, and nausea.  If you were given a sedative during the procedure, it can affect you for several hours. Do not drive or operate machinery until your health care provider says that it is safe.  Follow instructions from your health care provider about what to eat or drink after your procedure.  Return  to your normal activities as told by your health care provider. This information is not intended to replace advice given to you by your health care provider. Make sure you discuss any questions you have with your health care provider. Document Revised: 07/10/2019 Document Reviewed: 12/12/2017 Elsevier Patient Education  2021 [name] After This sheet gives you information about how to care for yourself after your procedure. Your health care provider may also give you more specific instructions. If you have problems or questions, contact your health care provider. What can I expect after the  procedure? After the procedure, it is common to have:  Tiredness.  Forgetfulness about what happened after the procedure.  Impaired judgment for important decisions.  Nausea or vomiting.  Some difficulty with balance. Follow these instructions at home: For the time period you were told by your health care provider:  Rest as needed.  Do not participate in activities where you could fall or become injured.  Do not drive or use machinery.  Do not drink alcohol.  Do not take sleeping pills or medicines that cause drowsiness.  Do not make important decisions or sign legal documents.  Do not take care of children on your own.      Eating and drinking  Follow the diet that is recommended by your health care provider.  Drink enough fluid to keep your urine pale yellow.  If you vomit: ? Drink water, juice, or soup when you can drink without vomiting. ? Make sure you have little or no nausea before eating solid foods. General instructions  Have a responsible adult stay with you for the time you are told. It is important to have someone help care for you until you are awake and alert.  Take over-the-counter and prescription medicines only as told by your health care provider.  If you have sleep apnea, surgery and certain medicines can increase your risk for breathing problems. Follow instructions from your health care provider about wearing your sleep device: ? Anytime you are sleeping, including during daytime naps. ? While taking prescription pain medicines, sleeping medicines, or medicines that make you drowsy.  Avoid smoking.  Keep all follow-up visits as told by your health care provider. This is important. Contact a health care provider if:  You keep feeling nauseous or you keep vomiting.  You feel light-headed.  You are still sleepy or having trouble with balance after 24 hours.  You develop a rash.  You have a fever.  You have redness or swelling around  the IV site. Get help right away if:  You have trouble breathing.  You have new-onset confusion at home. Summary  For several hours after your procedure, you may feel tired. You may also be forgetful and have poor judgment.  Have a responsible adult stay with you for the time you are told. It is important to have someone help care for you until you are awake and alert.  Rest as told. Do not drive or operate machinery. Do not drink alcohol or take sleeping pills.  Get help right away if you have trouble breathing, or if you suddenly become confused. This information is not intended to replace advice given to you by your health care provider. Make sure you discuss any questions you have with your health care provider. Document Revised: 03/27/2020 Document Reviewed: 06/14/2019 Elsevier Patient Education  2021 [employer].

## 2020-08-13 ENCOUNTER — Other Ambulatory Visit (HOSPITAL_COMMUNITY): Payer: Commercial Managed Care - PPO | Attending: Gastroenterology

## 2020-08-13 ENCOUNTER — Encounter (HOSPITAL_COMMUNITY): Payer: Self-pay

## 2020-08-13 ENCOUNTER — Encounter (HOSPITAL_COMMUNITY)
Admission: RE | Admit: 2020-08-13 | Discharge: 2020-08-13 | Disposition: A | Discharge status: Home / Self Care | Payer: Commercial Managed Care - PPO | Source: Ambulatory Visit | Attending: Gastroenterology | Admitting: Gastroenterology

## 2020-08-14 ENCOUNTER — Encounter (HOSPITAL_COMMUNITY): Payer: Self-pay | Admitting: Anesthesiology

## 2020-08-14 ENCOUNTER — Telehealth (INDEPENDENT_AMBULATORY_CARE_PROVIDER_SITE_OTHER): Payer: Commercial Managed Care - PPO | Admitting: Gastroenterology

## 2020-08-14 ENCOUNTER — Encounter (INDEPENDENT_AMBULATORY_CARE_PROVIDER_SITE_OTHER): Payer: Self-pay | Admitting: Gastroenterology

## 2020-08-14 ENCOUNTER — Other Ambulatory Visit: Payer: Self-pay

## 2020-08-14 VITALS — Ht 67.0 in | Wt 213.0 lb

## 2020-08-14 DIAGNOSIS — K7460 Unspecified cirrhosis of liver: Secondary | ICD-10-CM | POA: Insufficient documentation

## 2020-08-14 DIAGNOSIS — K7581 Nonalcoholic steatohepatitis (NASH): Secondary | ICD-10-CM

## 2020-08-14 DIAGNOSIS — I8510 Secondary esophageal varices without bleeding: Secondary | ICD-10-CM | POA: Diagnosis not present

## 2020-08-14 DIAGNOSIS — R188 Other ascites: Secondary | ICD-10-CM

## 2020-08-14 DIAGNOSIS — I8500 Esophageal varices without bleeding: Secondary | ICD-10-CM | POA: Insufficient documentation
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
## 2020-08-14 NOTE — Patient Instructions (Addendum)
Schedule EGD Schedule liver US Perform blood workup

## 2020-08-15 ENCOUNTER — Encounter (HOSPITAL_COMMUNITY): Admission: RE | Payer: Self-pay | Source: Home / Self Care

## 2020-08-15 ENCOUNTER — Ambulatory Visit (HOSPITAL_COMMUNITY)
Admission: RE | Admit: 2020-08-15 | Payer: Commercial Managed Care - PPO | Source: Home / Self Care | Admitting: Gastroenterology

## 2020-08-15 SURGERY — ESOPHAGOGASTRODUODENOSCOPY (EGD) WITH PROPOFOL
Anesthesia: Monitor Anesthesia Care

## 2020-09-05 ENCOUNTER — Ambulatory Visit (HOSPITAL_COMMUNITY): Payer: Commercial Managed Care - PPO

## 2020-09-10 ENCOUNTER — Ambulatory Visit (HOSPITAL_COMMUNITY): Payer: Commercial Managed Care - PPO

## 2020-09-17 ENCOUNTER — Telehealth (INDEPENDENT_AMBULATORY_CARE_PROVIDER_SITE_OTHER): Payer: Self-pay | Admitting: Gastroenterology

## 2020-09-17 NOTE — Telephone Encounter (Signed)
I spoke with the patient regarding the results of her liver ultrasound that showed findings consistent with cirrhosis but no presence of masses.  CBD was 3.2 mm.  No other alterations besides splenomegaly.  Patient understood and agreed.

## 2020-09-26 ENCOUNTER — Encounter (INDEPENDENT_AMBULATORY_CARE_PROVIDER_SITE_OTHER): Payer: Self-pay

## 2020-11-13 ENCOUNTER — Ambulatory Visit (INDEPENDENT_AMBULATORY_CARE_PROVIDER_SITE_OTHER): Payer: Commercial Managed Care - PPO | Admitting: Gastroenterology

## 2020-12-11 ENCOUNTER — Encounter (INDEPENDENT_AMBULATORY_CARE_PROVIDER_SITE_OTHER): Payer: Self-pay

## 2020-12-11 ENCOUNTER — Ambulatory Visit (INDEPENDENT_AMBULATORY_CARE_PROVIDER_SITE_OTHER): Payer: Commercial Managed Care - PPO | Admitting: Gastroenterology

## 2020-12-11 ENCOUNTER — Encounter (INDEPENDENT_AMBULATORY_CARE_PROVIDER_SITE_OTHER): Payer: Self-pay | Admitting: Gastroenterology

## 2020-12-11 ENCOUNTER — Other Ambulatory Visit: Payer: Self-pay

## 2020-12-11 VITALS — BP 149/72 | HR 78 | Temp 98.5°F | Ht 67.0 in | Wt 219.8 lb

## 2020-12-11 DIAGNOSIS — K7460 Unspecified cirrhosis of liver: Secondary | ICD-10-CM

## 2020-12-11 NOTE — Patient Instructions (Addendum)
Perform blood workup Schedule liver US in August 2022 Schedule EGD Will refer you to [facility] transplant hepatology - Reduce salt intake to <2 g per day - Can take Tylenol max of 2 g per day (650 mg q8h) for pain - Avoid NSAIDs for pain - Avoid eating raw oysters or shellfish - Ensure every night before going to sleep
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
## 2020-12-12 ENCOUNTER — Other Ambulatory Visit (INDEPENDENT_AMBULATORY_CARE_PROVIDER_SITE_OTHER): Payer: Self-pay

## 2020-12-12 DIAGNOSIS — I8500 Esophageal varices without bleeding: Secondary | ICD-10-CM

## 2020-12-13 ENCOUNTER — Emergency Department (HOSPITAL_COMMUNITY): Payer: Commercial Managed Care - PPO

## 2020-12-13 ENCOUNTER — Emergency Department (HOSPITAL_COMMUNITY)
Admission: EM | Admit: 2020-12-13 | Discharge: 2020-12-13 | Disposition: A | Discharge status: Home / Self Care | Payer: Commercial Managed Care - PPO | Attending: Emergency Medicine | Admitting: Emergency Medicine

## 2020-12-13 ENCOUNTER — Encounter (HOSPITAL_COMMUNITY): Payer: Self-pay | Admitting: Emergency Medicine

## 2020-12-13 DIAGNOSIS — M79605 Pain in left leg: Secondary | ICD-10-CM | POA: Diagnosis not present

## 2020-12-13 DIAGNOSIS — R0789 Other chest pain: Secondary | ICD-10-CM | POA: Diagnosis not present

## 2020-12-13 DIAGNOSIS — I10 Essential (primary) hypertension: Secondary | ICD-10-CM | POA: Diagnosis not present

## 2020-12-13 DIAGNOSIS — E876 Hypokalemia: Secondary | ICD-10-CM | POA: Insufficient documentation

## 2020-12-13 DIAGNOSIS — Z7984 Long term (current) use of oral hypoglycemic drugs: Secondary | ICD-10-CM | POA: Insufficient documentation

## 2020-12-13 DIAGNOSIS — R55 Syncope and collapse: Secondary | ICD-10-CM | POA: Diagnosis present

## 2020-12-13 DIAGNOSIS — R778 Other specified abnormalities of plasma proteins: Secondary | ICD-10-CM | POA: Diagnosis not present

## 2020-12-13 DIAGNOSIS — Z79899 Other long term (current) drug therapy: Secondary | ICD-10-CM | POA: Diagnosis not present

## 2020-12-13 DIAGNOSIS — Z7901 Long term (current) use of anticoagulants: Secondary | ICD-10-CM | POA: Insufficient documentation

## 2020-12-13 DIAGNOSIS — E119 Type 2 diabetes mellitus without complications: Secondary | ICD-10-CM | POA: Diagnosis not present

## 2020-12-13 DIAGNOSIS — R4182 Altered mental status, unspecified: Secondary | ICD-10-CM | POA: Diagnosis not present

## 2020-12-13 DIAGNOSIS — M79604 Pain in right leg: Secondary | ICD-10-CM | POA: Insufficient documentation

## 2020-12-13 DIAGNOSIS — Z20822 Contact with and (suspected) exposure to covid-19: Secondary | ICD-10-CM | POA: Diagnosis not present

## 2020-12-13 DIAGNOSIS — R52 Pain, unspecified: Secondary | ICD-10-CM

## 2020-12-13 DIAGNOSIS — J90 Pleural effusion, not elsewhere classified: Secondary | ICD-10-CM

## 2020-12-13 DIAGNOSIS — R079 Chest pain, unspecified: Secondary | ICD-10-CM

## 2020-12-13 LAB — URINALYSIS, ROUTINE W REFLEX MICROSCOPIC
Bilirubin Urine: NEGATIVE
Glucose, UA: 50 mg/dL — AB
Ketones, ur: NEGATIVE mg/dL
Nitrite: NEGATIVE
Protein, ur: NEGATIVE mg/dL
Specific Gravity, Urine: 1.012 (ref 1.005–1.030)
pH: 5 (ref 5.0–8.0)

## 2020-12-13 LAB — COMPREHENSIVE METABOLIC PANEL
ALT: 30 U/L (ref 0–44)
AST: 53 U/L — ABNORMAL HIGH (ref 15–41)
Albumin: 2.8 g/dL — ABNORMAL LOW (ref 3.5–5.0)
Alkaline Phosphatase: 126 U/L (ref 38–126)
Anion gap: 8 (ref 5–15)
BUN: 14 mg/dL (ref 8–23)
CO2: 25 mmol/L (ref 22–32)
Calcium: 9.9 mg/dL (ref 8.9–10.3)
Chloride: 104 mmol/L (ref 98–111)
Creatinine, Ser: 1.27 mg/dL — ABNORMAL HIGH (ref 0.44–1.00)
GFR, Estimated: 48 mL/min — ABNORMAL LOW (ref >60)
Glucose, Bld: 290 mg/dL — ABNORMAL HIGH (ref 70–99)
Potassium: 3 mmol/L — ABNORMAL LOW (ref 3.5–5.1)
Sodium: 137 mmol/L (ref 135–145)
Total Bilirubin: 1.5 mg/dL — ABNORMAL HIGH (ref 0.3–1.2)
Total Protein: 7.8 g/dL (ref 6.5–8.1)

## 2020-12-13 LAB — CBC WITH DIFFERENTIAL/PLATELET
Abs Immature Granulocytes: 0.03 10*3/uL (ref 0.00–0.07)
Basophils Absolute: 0 10*3/uL (ref 0.0–0.1)
Basophils Relative: 1 %
Eosinophils Absolute: 0.1 10*3/uL (ref 0.0–0.5)
Eosinophils Relative: 1 %
HCT: 36 % (ref 36.0–46.0)
Hemoglobin: 11.8 g/dL — ABNORMAL LOW (ref 12.0–15.0)
Immature Granulocytes: 0 %
Lymphocytes Relative: 20 %
Lymphs Abs: 1.5 10*3/uL (ref 0.7–4.0)
MCH: 28.9 pg (ref 26.0–34.0)
MCHC: 32.8 g/dL (ref 30.0–36.0)
MCV: 88 fL (ref 80.0–100.0)
Monocytes Absolute: 0.8 10*3/uL (ref 0.1–1.0)
Monocytes Relative: 10 %
Neutro Abs: 5.1 10*3/uL (ref 1.7–7.7)
Neutrophils Relative %: 68 %
Platelets: 110 10*3/uL — ABNORMAL LOW (ref 150–400)
RBC: 4.09 MIL/uL (ref 3.87–5.11)
RDW: 15.1 % (ref 11.5–15.5)
WBC: 7.5 10*3/uL (ref 4.0–10.5)
nRBC: 0 % (ref 0.0–0.2)

## 2020-12-13 LAB — BLOOD GAS, VENOUS
Acid-base deficit: 0.4 mmol/L (ref 0.0–2.0)
Bicarbonate: 24.5 mmol/L (ref 20.0–28.0)
FIO2: 21
O2 Saturation: 98.5 %
Patient temperature: 98.6
pCO2, Ven: 43.4 mmHg — ABNORMAL LOW (ref 44.0–60.0)
pH, Ven: 7.37 (ref 7.250–7.430)
pO2, Ven: 125 mmHg — ABNORMAL HIGH (ref 32.0–45.0)

## 2020-12-13 LAB — PROTIME-INR
INR: 2.1 — ABNORMAL HIGH (ref 0.8–1.2)
Prothrombin Time: 23.7 seconds — ABNORMAL HIGH (ref 11.4–15.2)

## 2020-12-13 LAB — TROPONIN I (HIGH SENSITIVITY)
Troponin I (High Sensitivity): 19 ng/L — ABNORMAL HIGH (ref <18)
Troponin I (High Sensitivity): 20 ng/L — ABNORMAL HIGH (ref <18)

## 2020-12-13 LAB — LACTIC ACID, PLASMA
Lactic Acid, Venous: 2.7 mmol/L (ref 0.5–1.9)
Lactic Acid, Venous: 2 mmol/L (ref 0.5–1.9)

## 2020-12-13 LAB — RESP PANEL BY RT-PCR (FLU A&B, COVID) ARPGX2
Influenza A by PCR: NEGATIVE
Influenza B by PCR: NEGATIVE
SARS Coronavirus 2 by RT PCR: NEGATIVE

## 2020-12-13 LAB — D-DIMER, QUANTITATIVE: D-Dimer, Quant: 1.92 ug/mL-FEU — ABNORMAL HIGH (ref 0.00–0.50)

## 2020-12-13 LAB — MAGNESIUM: Magnesium: 1.4 mg/dL — ABNORMAL LOW (ref 1.7–2.4)

## 2020-12-13 MED ORDER — TECHNETIUM TO 99M ALBUMIN AGGREGATED
4.3800 | Freq: Once | INTRAVENOUS | Status: AC | PRN
  Administered 2020-12-13: 4.38 via INTRAVENOUS

## 2020-12-13 MED ORDER — POTASSIUM CHLORIDE CRYS ER 20 MEQ PO TBCR
40.0000 meq | EXTENDED_RELEASE_TABLET | Freq: Once | ORAL | Status: AC
  Administered 2020-12-13: 40 meq via ORAL
  Filled 2020-12-13: qty 2

## 2020-12-13 MED ORDER — IOHEXOL 350 MG/ML SOLN
100.0000 mL | Freq: Once | INTRAVENOUS | Status: AC | PRN
  Administered 2020-12-13: 100 mL via INTRAVENOUS

## 2020-12-13 MED ORDER — ONDANSETRON HCL 4 MG/2ML IJ SOLN
4.0000 mg | Freq: Once | INTRAMUSCULAR | Status: AC
  Administered 2020-12-13: 4 mg via INTRAVENOUS
  Filled 2020-12-13: qty 2

## 2020-12-13 MED ORDER — LABETALOL HCL 5 MG/ML IV SOLN
10.0000 mg | Freq: Once | INTRAVENOUS | Status: AC
  Administered 2020-12-13: 10 mg via INTRAVENOUS
  Filled 2020-12-13: qty 4

## 2020-12-13 MED ORDER — MAGNESIUM SULFATE 2 GM/50ML IV SOLN
2.0000 g | Freq: Once | INTRAVENOUS | Status: AC
  Administered 2020-12-13: 2 g via INTRAVENOUS
  Filled 2020-12-13: qty 50

## 2020-12-13 MED ORDER — SODIUM CHLORIDE 0.9 % IV BOLUS
1000.0000 mL | Freq: Once | INTRAVENOUS | Status: AC
  Administered 2020-12-13: 1000 mL via INTRAVENOUS

## 2020-12-13 MED ORDER — POTASSIUM CHLORIDE 10 MEQ/100ML IV SOLN
10.0000 meq | INTRAVENOUS | Status: AC
  Administered 2020-12-13 (×2): 10 meq via INTRAVENOUS
  Filled 2020-12-13 (×2): qty 100

## 2020-12-13 NOTE — ED Notes (Signed)
Patient transported to CT 
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
## 2020-12-13 NOTE — ED Notes (Signed)
ED Provider at bedside. 

## 2020-12-13 NOTE — ED Notes (Signed)
Patient transported for V/Q scan 

## 2020-12-13 NOTE — ED Notes (Signed)
Patient being transported to XR/CT at this time.   

## 2020-12-13 NOTE — ED Provider Notes (Signed)
Patient care assumed at 1500. Patient here for evaluation of weakness, chest discomfort and transient confusion that occurred earlier today. Labs with hypokalemia, minimal elevation in troponin, VQ scan is pending.  VQ scan is concerning for possible PE, will obtain CTA.  CTA is negative for PE, does demonstrate moderate right sided pleural effusion. On repeat assessment patient is asymptomatic. Her INR is therapeutic at 2.1 for a fib. Troponin's are minimally elevated but flat, no recurrent chest pain. She is at her baseline. Presentation is not consistent with CVA, ACS, dissection, pneumonia. Her electrolytes were repeated in the emergency department. Plan to discharge home with close outpatient follow-up and return precautions.   Tilden Fossa, MD 12/13/20 [phone]
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
## 2020-12-13 NOTE — ED Notes (Signed)
CBG read 253 mg/dl

## 2020-12-15 ENCOUNTER — Other Ambulatory Visit (HOSPITAL_COMMUNITY): Payer: Self-pay | Admitting: Cardiology

## 2020-12-15 LAB — CBG MONITORING, ED: Glucose-Capillary: 253 mg/dL — ABNORMAL HIGH (ref 70–99)

## 2020-12-18 LAB — CULTURE, BLOOD (ROUTINE X 2)
Culture: NO GROWTH
Culture: NO GROWTH
Special Requests: ADEQUATE

## 2020-12-30 NOTE — Patient Instructions (Signed)
Chelsea [name]  12/30/2020     @PREFPERIOPPHARMACY @   Your procedure is scheduled on  01/02/2021.   Report to 03/04/2021 at  0815  A.M.   Call this number if you have problems the morning of surgery:  [phone]   Remember:  Follow the diet instructions given to you by the office.                      Take these medicines the morning of surgery with A SIP OF WATER   Diltiazem, flecanide.  Your last dose of coumadin should have been 6/4 and your should have started your lovonox bridge.  DO NOT take your lovonox the morning of your procedure.  DO NOT take any medications for diabetes the morning of your procedure.     Please brush your teeth.  Do not wear jewelry, make-up or nail polish.  Do not wear lotions, powders, or perfumes, or deodorant.  Do not shave 48 hours prior to surgery.  Men may shave face and neck.  Do not bring valuables to the hospital.  [facility] is not responsible for any belongings or valuables.  Contacts, dentures or bridgework may not be worn into surgery.  Leave your suitcase in the car.  After surgery it may be brought to your room.  For patients admitted to the hospital, discharge time will be determined by your treatment team.  Patients discharged the day of surgery will not be allowed to drive home and must have someone with them for 24 hours.    Special instructions:  DO NOT smoke tobacco or vape for 24 hours before your procedure.  Please read over the following fact sheets that you were given. Anesthesia Post-op Instructions and Care and Recovery After Surgery       Upper Endoscopy, Adult, Care After This sheet gives you information about how to care for yourself after your procedure. Your health care provider may also give you more specific instructions. If you have problems or questions, contact your health care provider. What can I expect after the procedure? After the procedure, it is common to have:  A sore  throat.  Mild stomach pain or discomfort.  Bloating.  Nausea. Follow these instructions at home:  Follow instructions from your health care provider about what to eat or drink after your procedure.  Return to your normal activities as told by your health care provider. Ask your health care provider what activities are safe for you.  Take over-the-counter and prescription medicines only as told by your health care provider.  If you were given a sedative during the procedure, it can affect you for several hours. Do not drive or operate machinery until your health care provider says that it is safe.  Keep all follow-up visits as told by your health care provider. This is important.   Contact a health care provider if you have:  A sore throat that lasts longer than one day.  Trouble swallowing. Get help right away if:  You vomit blood or your vomit looks like coffee grounds.  You have: ? A fever. ? Bloody, black, or tarry stools. ? A severe sore throat or you cannot swallow. ? Difficulty breathing. ? Severe pain in your chest or abdomen. Summary  After the procedure, it is common to have a sore throat, mild stomach discomfort, bloating, and nausea.  If you were given a sedative during the procedure, it can affect  you for several hours. Do not drive or operate machinery until your health care provider says that it is safe.  Follow instructions from your health care provider about what to eat or drink after your procedure.  Return to your normal activities as told by your health care provider. This information is not intended to replace advice given to you by your health care provider. Make sure you discuss any questions you have with your health care provider. Document Revised: 07/10/2019 Document Reviewed: 12/12/2017 Elsevier Patient Education  2021 [employer] Monitored Anesthesia Care, Care After This sheet gives you information about how to care for yourself after your  procedure. Your health care provider may also give you more specific instructions. If you have problems or questions, contact your health care provider. What can I expect after the procedure? After the procedure, it is common to have:  Tiredness.  Forgetfulness about what happened after the procedure.  Impaired judgment for important decisions.  Nausea or vomiting.  Some difficulty with balance. Follow these instructions at home: For the time period you were told by your health care provider:  Rest as needed.  Do not participate in activities where you could fall or become injured.  Do not drive or use machinery.  Do not drink alcohol.  Do not take sleeping pills or medicines that cause drowsiness.  Do not make important decisions or sign legal documents.  Do not take care of children on your own.      Eating and drinking  Follow the diet that is recommended by your health care provider.  Drink enough fluid to keep your urine pale yellow.  If you vomit: ? Drink water, juice, or soup when you can drink without vomiting. ? Make sure you have little or no nausea before eating solid foods. General instructions  Have a responsible adult stay with you for the time you are told. It is important to have someone help care for you until you are awake and alert.  Take over-the-counter and prescription medicines only as told by your health care provider.  If you have sleep apnea, surgery and certain medicines can increase your risk for breathing problems. Follow instructions from your health care provider about wearing your sleep device: ? Anytime you are sleeping, including during daytime naps. ? While taking prescription pain medicines, sleeping medicines, or medicines that make you drowsy.  Avoid smoking.  Keep all follow-up visits as told by your health care provider. This is important. Contact a health care provider if:  You keep feeling nauseous or you keep  vomiting.  You feel light-headed.  You are still sleepy or having trouble with balance after 24 hours.  You develop a rash.  You have a fever.  You have redness or swelling around the IV site. Get help right away if:  You have trouble breathing.  You have new-onset confusion at home. Summary  For several hours after your procedure, you may feel tired. You may also be forgetful and have poor judgment.  Have a responsible adult stay with you for the time you are told. It is important to have someone help care for you until you are awake and alert.  Rest as told. Do not drive or operate machinery. Do not drink alcohol or take sleeping pills.  Get help right away if you have trouble breathing, or if you suddenly become confused. This information is not intended to replace advice given to you by your health care provider. Make sure you  discuss any questions you have with your health care provider. Document Revised: 03/27/2020 Document Reviewed: 06/14/2019 Elsevier Patient Education  2021 [employer].

## 2021-01-01 ENCOUNTER — Other Ambulatory Visit: Payer: Self-pay

## 2021-01-01 ENCOUNTER — Encounter (HOSPITAL_COMMUNITY)
Admission: RE | Admit: 2021-01-01 | Discharge: 2021-01-01 | Disposition: A | Discharge status: Home / Self Care | Payer: Commercial Managed Care - PPO | Source: Ambulatory Visit | Attending: Gastroenterology | Admitting: Gastroenterology

## 2021-01-01 ENCOUNTER — Other Ambulatory Visit (HOSPITAL_COMMUNITY): Payer: Commercial Managed Care - PPO | Attending: Gastroenterology

## 2021-01-01 ENCOUNTER — Encounter (HOSPITAL_COMMUNITY): Payer: Self-pay

## 2021-01-01 HISTORY — DX: Chronic obstructive pulmonary disease, unspecified: J44.9

## 2021-01-01 HISTORY — DX: Hyperlipidemia, unspecified: E78.5

## 2021-01-01 HISTORY — DX: Sleep apnea, unspecified: G47.30

## 2021-01-01 HISTORY — DX: Family history of other specified conditions: Z84.89

## 2021-01-01 HISTORY — DX: Unspecified atrial fibrillation: I48.91

## 2021-01-01 HISTORY — DX: Cardiac arrhythmia, unspecified: I49.9

## 2021-01-01 HISTORY — DX: Other complications of anesthesia, initial encounter: T88.59XA

## 2021-01-01 HISTORY — DX: Heart failure, unspecified: I50.9

## 2021-01-02 ENCOUNTER — Other Ambulatory Visit: Payer: Self-pay

## 2021-01-02 ENCOUNTER — Encounter (HOSPITAL_COMMUNITY)
Admission: RE | Disposition: A | Discharge status: Home / Self Care | Payer: Self-pay | Source: Home / Self Care | Attending: Gastroenterology

## 2021-01-02 ENCOUNTER — Encounter (HOSPITAL_COMMUNITY): Payer: Self-pay | Admitting: Gastroenterology

## 2021-01-02 ENCOUNTER — Ambulatory Visit (HOSPITAL_COMMUNITY): Payer: Commercial Managed Care - PPO | Admitting: Anesthesiology

## 2021-01-02 ENCOUNTER — Telehealth (INDEPENDENT_AMBULATORY_CARE_PROVIDER_SITE_OTHER): Payer: Self-pay | Admitting: *Deleted

## 2021-01-02 ENCOUNTER — Ambulatory Visit (HOSPITAL_COMMUNITY)
Admission: RE | Admit: 2021-01-02 | Discharge: 2021-01-02 | Disposition: A | Discharge status: Home / Self Care | Payer: Commercial Managed Care - PPO | Attending: Gastroenterology | Admitting: Gastroenterology

## 2021-01-02 DIAGNOSIS — K7469 Other cirrhosis of liver: Secondary | ICD-10-CM | POA: Insufficient documentation

## 2021-01-02 DIAGNOSIS — E119 Type 2 diabetes mellitus without complications: Secondary | ICD-10-CM | POA: Insufficient documentation

## 2021-01-02 DIAGNOSIS — K3189 Other diseases of stomach and duodenum: Secondary | ICD-10-CM | POA: Insufficient documentation

## 2021-01-02 DIAGNOSIS — K766 Portal hypertension: Secondary | ICD-10-CM

## 2021-01-02 DIAGNOSIS — Z888 Allergy status to other drugs, medicaments and biological substances status: Secondary | ICD-10-CM | POA: Insufficient documentation

## 2021-01-02 DIAGNOSIS — Z09 Encounter for follow-up examination after completed treatment for conditions other than malignant neoplasm: Secondary | ICD-10-CM | POA: Insufficient documentation

## 2021-01-02 DIAGNOSIS — Z79899 Other long term (current) drug therapy: Secondary | ICD-10-CM | POA: Insufficient documentation

## 2021-01-02 DIAGNOSIS — Z7984 Long term (current) use of oral hypoglycemic drugs: Secondary | ICD-10-CM | POA: Diagnosis not present

## 2021-01-02 DIAGNOSIS — I10 Essential (primary) hypertension: Secondary | ICD-10-CM | POA: Insufficient documentation

## 2021-01-02 DIAGNOSIS — I4891 Unspecified atrial fibrillation: Secondary | ICD-10-CM | POA: Diagnosis not present

## 2021-01-02 DIAGNOSIS — I8500 Esophageal varices without bleeding: Secondary | ICD-10-CM | POA: Diagnosis not present

## 2021-01-02 DIAGNOSIS — Z7901 Long term (current) use of anticoagulants: Secondary | ICD-10-CM | POA: Insufficient documentation

## 2021-01-02 DIAGNOSIS — I8510 Secondary esophageal varices without bleeding: Secondary | ICD-10-CM | POA: Diagnosis not present

## 2021-01-02 DIAGNOSIS — R188 Other ascites: Secondary | ICD-10-CM

## 2021-01-02 DIAGNOSIS — D509 Iron deficiency anemia, unspecified: Secondary | ICD-10-CM | POA: Diagnosis not present

## 2021-01-02 HISTORY — PX: ESOPHAGOGASTRODUODENOSCOPY (EGD) WITH PROPOFOL: SHX5813

## 2021-01-02 HISTORY — PX: ESOPHAGEAL BANDING: SHX5518

## 2021-01-02 LAB — GLUCOSE, CAPILLARY
Glucose-Capillary: 136 mg/dL — ABNORMAL HIGH (ref 70–99)
Glucose-Capillary: 135 mg/dL — ABNORMAL HIGH (ref 70–99)

## 2021-01-02 SURGERY — ESOPHAGOGASTRODUODENOSCOPY (EGD) WITH PROPOFOL
Anesthesia: General

## 2021-01-02 MED ORDER — PROPOFOL 10 MG/ML IV BOLUS
INTRAVENOUS | Status: DC | PRN
  Administered 2021-01-02: 100 mg via INTRAVENOUS

## 2021-01-02 MED ORDER — STERILE WATER FOR IRRIGATION IR SOLN
Status: DC | PRN
  Administered 2021-01-02: 100 mL

## 2021-01-02 MED ORDER — LACTATED RINGERS IV SOLN: INTRAVENOUS | Status: DC

## 2021-01-02 MED ORDER — OMEPRAZOLE 40 MG PO CPDR: 40.0000 mg | DELAYED_RELEASE_CAPSULE | Freq: Two times a day (BID) | ORAL | 0 refills | Status: DC

## 2021-01-02 MED ORDER — LIDOCAINE HCL (CARDIAC) PF 100 MG/5ML IV SOSY
PREFILLED_SYRINGE | INTRAVENOUS | Status: DC | PRN
  Administered 2021-01-02: 50 mg via INTRAVENOUS

## 2021-01-02 MED ORDER — PROPOFOL 500 MG/50ML IV EMUL
INTRAVENOUS | Status: DC | PRN
  Administered 2021-01-02: 150 ug/kg/min via INTRAVENOUS

## 2021-01-02 NOTE — Telephone Encounter (Signed)
Noted, thank you

## 2021-01-02 NOTE — Anesthesia Procedure Notes (Signed)
Date/Time: 01/02/2021 8:46 AM Performed by: Julian Reil, CRNA Pre-anesthesia Checklist: Patient identified, Emergency Drugs available, Suction available and Patient being monitored Patient Re-evaluated:Patient Re-evaluated prior to induction Oxygen Delivery Method: Nasal cannula Induction Type: IV induction Placement Confirmation: positive ETCO2

## 2021-01-02 NOTE — Telephone Encounter (Signed)
Repeat upper endoscopy in 6 weeks for surveillance

## 2021-01-02 NOTE — Transfer of Care (Signed)
Immediate Anesthesia Transfer of Care Note  Patient: Chelsea [name]  Procedure(s) Performed: ESOPHAGOGASTRODUODENOSCOPY (EGD) WITH PROPOFOL ESOPHAGEAL BANDING  Patient Location: PACU  Anesthesia Type:General  Level of Consciousness: drowsy  Airway & Oxygen Therapy: Patient Spontanous Breathing and Patient connected to nasal cannula oxygen  Post-op Assessment: Report given to RN and Post -op Vital signs reviewed and stable  Post vital signs: Reviewed and stable  Last Vitals:  Vitals Value Taken Time  BP 159/64 01/02/21 0857  Temp    Pulse 82 01/02/21 0858  Resp 18 01/02/21 0858  SpO2 98 % 01/02/21 0858  Vitals shown include unvalidated device data.  Last Pain:  Vitals:   01/02/21 0840  TempSrc:   PainSc: 0-No pain      Patients Stated Pain Goal: 7 (01/02/21 0816)  Complications: No notable events documented.

## 2021-01-02 NOTE — Interval H&P Note (Signed)
History and Physical Interval Note:  01/02/2021 8:24 AM Chelsea [name] is a 65 y.o. female with PMH IDA, DM, afib on AC, HTN, NASH cirrhosis,  who presents for evaluation of esophageal varices.  Patient stated that she has felt fine and denies any complaints.  Has not present any hematemesis, nausea, vomiting, abdominal pain or distention, melena or hematochezia.  Last EGD was performed in 2021, was found to have 3 columns of grade 3 varices, 3 bands were successfully placed at that time.  She was advised to have a repeat EGD in 8 weeks but she did not schedule this.  Ht 5\' 7"  (1.702 m)   Wt 98.9 kg   BMI 34.14 kg/m  HR 75 RR  14 BP 126/81 GENERAL: The patient is AO x3, in no acute distress. Obese  HEENT: Head is normocephalic and atraumatic. EOMI are intact. Mouth is well hydrated and without lesions. NECK: Supple. No masses LUNGS: Clear to auscultation. No presence of rhonchi/wheezing/rales. Adequate chest expansion HEART: RRR, normal s1 and s2. ABDOMEN: Soft, nontender, no guarding, no peritoneal signs, and nondistended. BS +. No masses. EXTREMITIES: Without any cyanosis, clubbing, rash, lesions or edema. NEUROLOGIC: AOx3, no focal motor deficit. SKIN: no jaundice, no rashes   [name]  has presented today for surgery, with the diagnosis of Esophageal Varices.  The various methods of treatment have been discussed with the patient and family. After consideration of risks, benefits and other options for treatment, the patient has consented to  Procedure(s) with comments: ESOPHAGOGASTRODUODENOSCOPY (EGD) WITH PROPOFOL (N/A) - 10:15 as a surgical intervention.  The patient's history has been reviewed, patient examined, no change in status, stable for surgery.  I have reviewed the patient's chart and labs.  Questions were answered to the patient's satisfaction.     [name]

## 2021-01-02 NOTE — Anesthesia Postprocedure Evaluation (Signed)
Anesthesia Post Note  Patient: Chelsea [name]  Procedure(s) Performed: ESOPHAGOGASTRODUODENOSCOPY (EGD) WITH PROPOFOL ESOPHAGEAL BANDING  Patient location during evaluation: Phase II Anesthesia Type: General Level of consciousness: awake and alert and oriented Pain management: pain level controlled Vital Signs Assessment: post-procedure vital signs reviewed and stable Respiratory status: spontaneous breathing and respiratory function stable Cardiovascular status: blood pressure returned to baseline and stable Postop Assessment: no apparent nausea or vomiting Anesthetic complications: no   No notable events documented.   Last Vitals:  Vitals:   01/02/21 0915 01/02/21 0918  BP:  137/83  Pulse: 80 76  Resp: (!) 24 20  Temp:  36.5 C  SpO2: 99% 100%    Last Pain:  Vitals:   01/02/21 0918  TempSrc: Oral  PainSc: 0-No pain                 Ubah Radke C Vint Pola

## 2021-01-02 NOTE — Op Note (Signed)
[facility] Patient Name: Chelsea [name] Procedure Date: 01/02/2021 8:12 AM MRN: [id number] Date of Birth: 12/19/1955 Attending MD: [name] ,  CSN: [id number] Age: 65 Admit Type: Outpatient Procedure:                Upper GI endoscopy Indications:              Follow-up of esophageal varices Providers:                [name], Jannett Celestine, RN, Edrick Kins,                            RN, Cyril Mourning, Technician, Pandora Leiter, Technician Referring MD:              Medicines:                Monitored Anesthesia Care Complications:            No immediate complications. Estimated Blood Loss:     Estimated blood loss: none. Procedure:                Pre-Anesthesia Assessment:                           - Prior to the procedure, a History and Physical                            was performed, and patient medications, allergies                            and sensitivities were reviewed. The patient's                            tolerance of previous anesthesia was reviewed.                           - The risks and benefits of the procedure and the                            sedation options and risks were discussed with the                            patient. All questions were answered and informed                            consent was obtained.                           - ASA Grade Assessment: III - A patient with severe                            systemic disease.                           After obtaining informed consent, the endoscope was  passed under direct vision. Throughout the                            procedure, the patient's blood pressure, pulse, and                            oxygen saturations were monitored continuously. The                            [id number]-[id number] ([id number]) scope was introduced through the                            mouth, and advanced to the second part of duodenum.                             The upper GI endoscopy was accomplished without                            difficulty. The patient tolerated the procedure                            well. Scope In: 8:44:56 AM Scope Out: 8:53:30 AM Total Procedure Duration: 0 hours 8 minutes 34 seconds  Findings:      Grade II varices were found in the lower third of the esophagus. One       band was successfully placed with complete eradication, resulting in       deflation of varices. There was no bleeding during the procedure.      Mild, diffuse portal hypertensive gastropathy was found in the entire       examined stomach. Upon careful inspection, no gastric varices were       observed.      The examined duodenum was normal. Impression:               - Grade II esophageal varices. Completely                            eradicated. Banded.                           - Portal hypertensive gastropathy.                           - Normal examined duodenum.                           - No specimens collected. Moderate Sedation:      Per Anesthesia Care Recommendation:           - Discharge patient to home (ambulatory).                           - Resume previous diet.                           - Repeat upper endoscopy in 6 weeks for  surveillance.                           - Restart coumadin tonight.                           - Take omeprazoel 40 mg twice a day for 4 weeks. Procedure Code(s):        --- Professional ---                           [phone], Esophagogastroduodenoscopy, flexible,                            transoral; with band ligation of esophageal/gastric                            varices Diagnosis Code(s):        --- Professional ---                           I85.00, Esophageal varices without bleeding                           K76.6, Portal hypertension                           K31.89, Other diseases of stomach and duodenum CPT copyright 2019 American Medical Association.  All rights reserved. The codes documented in this report are preliminary and upon coder review may  be revised to meet current compliance requirements. [name], MD [name],  01/02/2021 8:59:51 AM This report has been signed electronically. Number of Addenda: 0

## 2021-01-02 NOTE — Discharge Instructions (Addendum)
You are being discharged to home.  Resume your previous diet.  Your physician has recommended a repeat upper endoscopy in six weeks for surveillance.Marland Kitchen Restart coumadin tonight. Take omeprazole 40 mg twice a day.

## 2021-01-02 NOTE — Anesthesia Preprocedure Evaluation (Signed)
Anesthesia Evaluation  Patient identified by MRN, date of birth, ID band Patient awake    Reviewed: Allergy & Precautions, NPO status , Patient's Chart, lab work & pertinent test results  History of Anesthesia Complications (+) Family history of anesthesia reaction and history of anesthetic complications  Airway Mallampati: II  TM Distance: >3 FB Neck ROM: Full    Dental  (+) Dental Advisory Given, Missing   Pulmonary sleep apnea , COPD,  COPD inhaler,    Pulmonary exam normal breath sounds clear to auscultation       Cardiovascular Exercise Tolerance: Good hypertension, Pt. on medications +CHF  + dysrhythmias Atrial Fibrillation  Rhythm:Irregular Rate:Abnormal + Systolic murmurs    Neuro/Psych CVA (subdural hematoma evacuation)    GI/Hepatic negative GI ROS, (+) Cirrhosis   Esophageal Varices    , Hepatitis -  Endo/Other  diabetes, Well Controlled, Type 2, Oral Hypoglycemic Agents  Renal/GU      Musculoskeletal negative musculoskeletal ROS (+)   Abdominal   Peds  Hematology  (+) anemia ,   Anesthesia Other Findings   Reproductive/Obstetrics negative OB ROS                            Anesthesia Physical Anesthesia Plan  ASA: 3  Anesthesia Plan: General   Post-op Pain Management:    Induction: Intravenous  PONV Risk Score and Plan: Propofol infusion  Airway Management Planned: Nasal Cannula and Natural Airway  Additional Equipment:   Intra-op Plan:   Post-operative Plan:   Informed Consent: I have reviewed the patients History and Physical, chart, labs and discussed the procedure including the risks, benefits and alternatives for the proposed anesthesia with the patient or authorized representative who has indicated his/her understanding and acceptance.       Plan Discussed with: CRNA and Surgeon  Anesthesia Plan Comments:         Anesthesia Quick  Evaluation

## 2021-01-09 ENCOUNTER — Encounter (HOSPITAL_COMMUNITY): Payer: Self-pay | Admitting: Gastroenterology

## 2021-01-21 ENCOUNTER — Encounter (INDEPENDENT_AMBULATORY_CARE_PROVIDER_SITE_OTHER): Payer: Self-pay

## 2021-01-27 ENCOUNTER — Other Ambulatory Visit (INDEPENDENT_AMBULATORY_CARE_PROVIDER_SITE_OTHER): Payer: Self-pay | Admitting: Gastroenterology

## 2021-01-27 DIAGNOSIS — R188 Other ascites: Secondary | ICD-10-CM

## 2021-01-28 ENCOUNTER — Other Ambulatory Visit (INDEPENDENT_AMBULATORY_CARE_PROVIDER_SITE_OTHER): Payer: Self-pay

## 2021-02-02 ENCOUNTER — Telehealth (INDEPENDENT_AMBULATORY_CARE_PROVIDER_SITE_OTHER): Payer: Self-pay | Admitting: *Deleted

## 2021-02-02 NOTE — Telephone Encounter (Signed)
I rec'd a fax today in regards to patients referral to [facility] - they have made several attempts to contact patient to schedule apt and have been unsuccessful in contacting her - in the fax they asked that we contact patient and give her the number they provided [phone] between 9am & 4pm) to schedule apt, if patient has not contacted them in the next 10 business days the referral will be canceled. I called Chelsea [name] and left a detailed message with this information

## 2021-02-02 NOTE — Telephone Encounter (Signed)
Thanks

## 2021-02-03 ENCOUNTER — Other Ambulatory Visit (INDEPENDENT_AMBULATORY_CARE_PROVIDER_SITE_OTHER): Payer: Self-pay | Admitting: Gastroenterology

## 2021-03-17 ENCOUNTER — Other Ambulatory Visit: Payer: Self-pay | Admitting: Cardiology

## 2021-04-16 ENCOUNTER — Other Ambulatory Visit (INDEPENDENT_AMBULATORY_CARE_PROVIDER_SITE_OTHER): Payer: Self-pay | Admitting: Gastroenterology

## 2021-05-11 ENCOUNTER — Other Ambulatory Visit: Payer: Self-pay | Admitting: Cardiology

## 2021-05-14 ENCOUNTER — Ambulatory Visit: Payer: Commercial Managed Care - PPO | Admitting: Cardiology

## 2021-06-01 ENCOUNTER — Encounter (INDEPENDENT_AMBULATORY_CARE_PROVIDER_SITE_OTHER): Payer: Self-pay | Admitting: Gastroenterology

## 2021-06-01 ENCOUNTER — Other Ambulatory Visit: Payer: Self-pay

## 2021-06-01 ENCOUNTER — Ambulatory Visit (INDEPENDENT_AMBULATORY_CARE_PROVIDER_SITE_OTHER): Payer: Commercial Managed Care - PPO | Admitting: Gastroenterology

## 2021-06-01 VITALS — BP 183/84 | HR 118 | Temp 99.9°F | Ht 67.0 in | Wt 217.7 lb

## 2021-06-01 DIAGNOSIS — K7581 Nonalcoholic steatohepatitis (NASH): Secondary | ICD-10-CM

## 2021-06-01 DIAGNOSIS — Z91199 Patient's noncompliance with other medical treatment and regimen due to unspecified reason: Secondary | ICD-10-CM | POA: Diagnosis not present

## 2021-06-01 DIAGNOSIS — I8510 Secondary esophageal varices without bleeding: Secondary | ICD-10-CM

## 2021-06-01 DIAGNOSIS — D509 Iron deficiency anemia, unspecified: Secondary | ICD-10-CM | POA: Insufficient documentation

## 2021-06-01 DIAGNOSIS — K7460 Unspecified cirrhosis of liver: Secondary | ICD-10-CM | POA: Diagnosis not present
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
## 2021-06-01 NOTE — Patient Instructions (Addendum)
-  We will update your cirrhosis labs today as these were not completed in May.  -You are also past due for your 6 month liver ultrasound, we will order this and get you set up to have this done. -You are also past due for repeat EGD, as you were supposed to have 6 week repeat after the last one you had in June. Please let us know as soon as you are ready to do this. -Please continue your spironolactone 100mg  daily, if you have episodes of confusion, vomiting blood, swelling in your belly or please make aware.   - Reduce salt intake to <2 g per day - Can take Tylenol max of 2 g per day (650 mg q8h) for pain - Avoid NSAIDs for pain - Avoid eating raw oysters/shellfish - Ensure shakes every night before going to sleep  Follow up 6 months

## 2021-06-04 ENCOUNTER — Other Ambulatory Visit: Payer: Self-pay | Admitting: Cardiology

## 2021-06-10 ENCOUNTER — Other Ambulatory Visit (INDEPENDENT_AMBULATORY_CARE_PROVIDER_SITE_OTHER): Payer: Self-pay

## 2021-06-10 DIAGNOSIS — K7460 Unspecified cirrhosis of liver: Secondary | ICD-10-CM

## 2021-06-10 DIAGNOSIS — K7581 Nonalcoholic steatohepatitis (NASH): Secondary | ICD-10-CM

## 2021-11-30 ENCOUNTER — Encounter (INDEPENDENT_AMBULATORY_CARE_PROVIDER_SITE_OTHER): Payer: Self-pay | Admitting: Gastroenterology

## 2021-11-30 ENCOUNTER — Ambulatory Visit (INDEPENDENT_AMBULATORY_CARE_PROVIDER_SITE_OTHER): Payer: Commercial Managed Care - PPO | Admitting: Gastroenterology

## 2021-11-30 VITALS — BP 145/71 | HR 108 | Temp 98.4°F | Ht 67.0 in | Wt 230.6 lb

## 2021-11-30 DIAGNOSIS — R600 Localized edema: Secondary | ICD-10-CM

## 2021-11-30 DIAGNOSIS — K7460 Unspecified cirrhosis of liver: Secondary | ICD-10-CM

## 2021-11-30 DIAGNOSIS — K7581 Nonalcoholic steatohepatitis (NASH): Secondary | ICD-10-CM

## 2021-11-30 DIAGNOSIS — I8510 Secondary esophageal varices without bleeding: Secondary | ICD-10-CM

## 2021-11-30 DIAGNOSIS — R7989 Other specified abnormal findings of blood chemistry: Secondary | ICD-10-CM
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
## 2021-11-30 NOTE — Patient Instructions (Addendum)
Schedule EGD ?Perform blood workup ?Continue furosemide 80 mg qday, may add spironolactone pending lab results ?- Schedule liver US ?- Will refer to [facility] transplant hepatology pending lab results ?- Reduce salt intake to <2 g per day ?- Can take Tylenol max of 2 g per day (650 mg q8h) for pain ?- Avoid NSAIDs for pain ?- Avoid eating raw oysters or shellfish ?- Ensure every night before going to sleep ?
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
## 2021-12-16 ENCOUNTER — Encounter (HOSPITAL_COMMUNITY): Payer: Self-pay | Admitting: Emergency Medicine

## 2021-12-16 ENCOUNTER — Other Ambulatory Visit: Payer: Self-pay

## 2021-12-16 ENCOUNTER — Inpatient Hospital Stay (HOSPITAL_COMMUNITY)
Admission: EM | Admit: 2021-12-16 | Discharge: 2021-12-21 | DRG: 432 | Disposition: A | Discharge status: Home / Self Care | Payer: No Typology Code available for payment source | Attending: Family Medicine | Admitting: Family Medicine

## 2021-12-16 DIAGNOSIS — J449 Chronic obstructive pulmonary disease, unspecified: Secondary | ICD-10-CM | POA: Diagnosis present

## 2021-12-16 DIAGNOSIS — K7460 Unspecified cirrhosis of liver: Secondary | ICD-10-CM | POA: Diagnosis present

## 2021-12-16 DIAGNOSIS — K7581 Nonalcoholic steatohepatitis (NASH): Secondary | ICD-10-CM | POA: Diagnosis present

## 2021-12-16 DIAGNOSIS — Z8249 Family history of ischemic heart disease and other diseases of the circulatory system: Secondary | ICD-10-CM | POA: Diagnosis not present

## 2021-12-16 DIAGNOSIS — I130 Hypertensive heart and chronic kidney disease with heart failure and stage 1 through stage 4 chronic kidney disease, or unspecified chronic kidney disease: Secondary | ICD-10-CM | POA: Diagnosis present

## 2021-12-16 DIAGNOSIS — D6959 Other secondary thrombocytopenia: Secondary | ICD-10-CM | POA: Diagnosis present

## 2021-12-16 DIAGNOSIS — E876 Hypokalemia: Secondary | ICD-10-CM | POA: Diagnosis present

## 2021-12-16 DIAGNOSIS — N1831 Chronic kidney disease, stage 3a: Secondary | ICD-10-CM | POA: Diagnosis present

## 2021-12-16 DIAGNOSIS — I4891 Unspecified atrial fibrillation: Secondary | ICD-10-CM | POA: Diagnosis not present

## 2021-12-16 DIAGNOSIS — I10 Essential (primary) hypertension: Secondary | ICD-10-CM | POA: Diagnosis present

## 2021-12-16 DIAGNOSIS — Z801 Family history of malignant neoplasm of trachea, bronchus and lung: Secondary | ICD-10-CM

## 2021-12-16 DIAGNOSIS — E119 Type 2 diabetes mellitus without complications: Secondary | ICD-10-CM

## 2021-12-16 DIAGNOSIS — Z8673 Personal history of transient ischemic attack (TIA), and cerebral infarction without residual deficits: Secondary | ICD-10-CM

## 2021-12-16 DIAGNOSIS — I8511 Secondary esophageal varices with bleeding: Secondary | ICD-10-CM | POA: Diagnosis present

## 2021-12-16 DIAGNOSIS — K766 Portal hypertension: Secondary | ICD-10-CM | POA: Diagnosis present

## 2021-12-16 DIAGNOSIS — E1122 Type 2 diabetes mellitus with diabetic chronic kidney disease: Secondary | ICD-10-CM | POA: Diagnosis present

## 2021-12-16 DIAGNOSIS — D62 Acute posthemorrhagic anemia: Secondary | ICD-10-CM | POA: Diagnosis present

## 2021-12-16 DIAGNOSIS — I482 Chronic atrial fibrillation, unspecified: Secondary | ICD-10-CM | POA: Diagnosis present

## 2021-12-16 DIAGNOSIS — R791 Abnormal coagulation profile: Secondary | ICD-10-CM | POA: Diagnosis not present

## 2021-12-16 DIAGNOSIS — I8510 Secondary esophageal varices without bleeding: Secondary | ICD-10-CM | POA: Diagnosis not present

## 2021-12-16 DIAGNOSIS — G4730 Sleep apnea, unspecified: Secondary | ICD-10-CM | POA: Diagnosis present

## 2021-12-16 DIAGNOSIS — Z79899 Other long term (current) drug therapy: Secondary | ICD-10-CM | POA: Diagnosis not present

## 2021-12-16 DIAGNOSIS — E785 Hyperlipidemia, unspecified: Secondary | ICD-10-CM | POA: Diagnosis present

## 2021-12-16 DIAGNOSIS — I8500 Esophageal varices without bleeding: Secondary | ICD-10-CM | POA: Diagnosis present

## 2021-12-16 DIAGNOSIS — D6832 Hemorrhagic disorder due to extrinsic circulating anticoagulants: Secondary | ICD-10-CM | POA: Diagnosis present

## 2021-12-16 DIAGNOSIS — Z7901 Long term (current) use of anticoagulants: Secondary | ICD-10-CM | POA: Diagnosis not present

## 2021-12-16 DIAGNOSIS — Z7984 Long term (current) use of oral hypoglycemic drugs: Secondary | ICD-10-CM | POA: Diagnosis not present

## 2021-12-16 DIAGNOSIS — Z888 Allergy status to other drugs, medicaments and biological substances status: Secondary | ICD-10-CM

## 2021-12-16 DIAGNOSIS — T45515A Adverse effect of anticoagulants, initial encounter: Secondary | ICD-10-CM | POA: Diagnosis present

## 2021-12-16 DIAGNOSIS — I5031 Acute diastolic (congestive) heart failure: Secondary | ICD-10-CM | POA: Diagnosis present

## 2021-12-16 DIAGNOSIS — Z91030 Bee allergy status: Secondary | ICD-10-CM

## 2021-12-16 DIAGNOSIS — Z803 Family history of malignant neoplasm of breast: Secondary | ICD-10-CM

## 2021-12-16 DIAGNOSIS — K3189 Other diseases of stomach and duodenum: Secondary | ICD-10-CM | POA: Diagnosis present

## 2021-12-16 DIAGNOSIS — Z833 Family history of diabetes mellitus: Secondary | ICD-10-CM | POA: Diagnosis not present

## 2021-12-16 DIAGNOSIS — Z66 Do not resuscitate: Secondary | ICD-10-CM | POA: Diagnosis present

## 2021-12-16 LAB — BASIC METABOLIC PANEL
Anion gap: 5 (ref 5–15)
BUN: 15 mg/dL (ref 8–23)
CO2: 24 mmol/L (ref 22–32)
Calcium: 9.2 mg/dL (ref 8.9–10.3)
Chloride: 106 mmol/L (ref 98–111)
Creatinine, Ser: 1.07 mg/dL — ABNORMAL HIGH (ref 0.44–1.00)
GFR, Estimated: 58 mL/min — ABNORMAL LOW (ref >60)
Glucose, Bld: 231 mg/dL — ABNORMAL HIGH (ref 70–99)
Potassium: 3.2 mmol/L — ABNORMAL LOW (ref 3.5–5.1)
Sodium: 135 mmol/L (ref 135–145)

## 2021-12-16 LAB — ABO/RH: ABO/RH(D): O POS

## 2021-12-16 LAB — PROTIME-INR
INR: 5.6 (ref 0.8–1.2)
Prothrombin Time: 50.5 seconds — ABNORMAL HIGH (ref 11.4–15.2)

## 2021-12-16 LAB — CBC
HCT: 14.9 % — ABNORMAL LOW (ref 36.0–46.0)
Hemoglobin: 4 g/dL — CL (ref 12.0–15.0)
MCH: 18.3 pg — ABNORMAL LOW (ref 26.0–34.0)
MCHC: 26.8 g/dL — ABNORMAL LOW (ref 30.0–36.0)
MCV: 68.3 fL — ABNORMAL LOW (ref 80.0–100.0)
Platelets: 83 10*3/uL — ABNORMAL LOW (ref 150–400)
RBC: 2.18 MIL/uL — ABNORMAL LOW (ref 3.87–5.11)
RDW: 18.1 % — ABNORMAL HIGH (ref 11.5–15.5)
WBC: 4.6 10*3/uL (ref 4.0–10.5)
nRBC: 0 % (ref 0.0–0.2)

## 2021-12-16 LAB — RAPID HIV SCREEN (HIV 1/2 AB+AG)
HIV 1/2 Antibodies: NONREACTIVE
HIV-1 P24 Antigen - HIV24: NONREACTIVE

## 2021-12-16 LAB — GLUCOSE, CAPILLARY
Glucose-Capillary: 177 mg/dL — ABNORMAL HIGH (ref 70–99)
Glucose-Capillary: 162 mg/dL — ABNORMAL HIGH (ref 70–99)

## 2021-12-16 LAB — MAGNESIUM: Magnesium: 1.6 mg/dL — ABNORMAL LOW (ref 1.7–2.4)

## 2021-12-16 LAB — PREPARE RBC (CROSSMATCH)

## 2021-12-16 LAB — MRSA NEXT GEN BY PCR, NASAL: MRSA by PCR Next Gen: NOT DETECTED

## 2021-12-16 LAB — CBG MONITORING, ED: Glucose-Capillary: 213 mg/dL — ABNORMAL HIGH (ref 70–99)

## 2021-12-16 MED ORDER — PANTOPRAZOLE 80MG IVPB - SIMPLE MED
80.0000 mg | Freq: Once | INTRAVENOUS | Status: AC
  Administered 2021-12-16: 80 mg via INTRAVENOUS

## 2021-12-16 MED ORDER — DILTIAZEM HCL 30 MG PO TABS
60.0000 mg | ORAL_TABLET | Freq: Once | ORAL | Status: AC
  Administered 2021-12-16: 60 mg via ORAL
  Filled 2021-12-16: qty 2

## 2021-12-16 MED ORDER — DILTIAZEM 12 MG/ML ORAL SUSPENSION
30.0000 mg | Freq: Three times a day (TID) | ORAL | Status: DC
  Filled 2021-12-16 (×5): qty 2.5

## 2021-12-16 MED ORDER — PANTOPRAZOLE INFUSION (NEW) - SIMPLE MED
8.0000 mg/h | INTRAVENOUS | Status: DC
Start: 2021-12-16 — End: 2021-12-17
  Administered 2021-12-16 – 2021-12-17 (×2): 8 mg/h via INTRAVENOUS
  Filled 2021-12-16: qty 80
  Filled 2021-12-16 (×2): qty 100
  Filled 2021-12-16: qty 80
  Filled 2021-12-16: qty 100

## 2021-12-16 MED ORDER — ACETAMINOPHEN 650 MG RE SUPP: 650.0000 mg | Freq: Four times a day (QID) | RECTAL | Status: DC | PRN

## 2021-12-16 MED ORDER — PANTOPRAZOLE SODIUM 40 MG IV SOLR
40.0000 mg | Freq: Once | INTRAVENOUS | Status: AC
  Administered 2021-12-16: 40 mg via INTRAVENOUS
  Filled 2021-12-16: qty 10

## 2021-12-16 MED ORDER — SODIUM CHLORIDE 0.9 % IV SOLN
50.0000 ug/h | INTRAVENOUS | Status: AC
  Administered 2021-12-16 – 2021-12-20 (×9): 50 ug/h via INTRAVENOUS
  Filled 2021-12-16 (×12): qty 1

## 2021-12-16 MED ORDER — ACETAMINOPHEN 325 MG PO TABS: 650.0000 mg | ORAL_TABLET | Freq: Four times a day (QID) | ORAL | Status: DC | PRN

## 2021-12-16 MED ORDER — SODIUM CHLORIDE 0.9 % IV SOLN
10.0000 mL/h | Freq: Once | INTRAVENOUS | Status: AC
  Administered 2021-12-16: 10 mL/h via INTRAVENOUS

## 2021-12-16 MED ORDER — OCTREOTIDE LOAD VIA INFUSION
50.0000 ug | Freq: Once | INTRAVENOUS | Status: AC
  Administered 2021-12-16: 50 ug via INTRAVENOUS
  Filled 2021-12-16: qty 25

## 2021-12-16 MED ORDER — ONDANSETRON HCL 4 MG PO TABS: 4.0000 mg | ORAL_TABLET | Freq: Four times a day (QID) | ORAL | Status: DC | PRN

## 2021-12-16 MED ORDER — FUROSEMIDE 10 MG/ML IJ SOLN
20.0000 mg | Freq: Once | INTRAMUSCULAR | Status: AC | PRN
  Administered 2021-12-17: 20 mg via INTRAVENOUS
  Filled 2021-12-16: qty 2

## 2021-12-16 MED ORDER — POTASSIUM CHLORIDE 20 MEQ PO PACK
40.0000 meq | PACK | Freq: Three times a day (TID) | ORAL | Status: DC
  Administered 2021-12-17: 40 meq via ORAL
  Filled 2021-12-16: qty 2

## 2021-12-16 MED ORDER — ONDANSETRON HCL 4 MG/2ML IJ SOLN: 4.0000 mg | Freq: Four times a day (QID) | INTRAMUSCULAR | Status: DC | PRN

## 2021-12-16 MED ORDER — POTASSIUM CHLORIDE 20 MEQ PO PACK
20.0000 meq | PACK | Freq: Once | ORAL | Status: AC
  Administered 2021-12-16: 20 meq via ORAL
  Filled 2021-12-16: qty 1

## 2021-12-16 MED ORDER — MAGNESIUM SULFATE 4 GM/100ML IV SOLN
4.0000 g | Freq: Once | INTRAVENOUS | Status: AC
  Administered 2021-12-16: 4 g via INTRAVENOUS
  Filled 2021-12-16: qty 100

## 2021-12-16 MED ORDER — INSULIN ASPART 100 UNIT/ML IJ SOLN
0.0000 [IU] | INTRAMUSCULAR | Status: DC
  Administered 2021-12-16: 2 [IU] via SUBCUTANEOUS
  Administered 2021-12-17 (×2): 1 [IU] via SUBCUTANEOUS
  Administered 2021-12-17 – 2021-12-18 (×6): 2 [IU] via SUBCUTANEOUS

## 2021-12-16 MED ORDER — PANTOPRAZOLE SODIUM 40 MG IV SOLR
40.0000 mg | Freq: Two times a day (BID) | INTRAVENOUS | Status: DC
Start: 2021-12-20 — End: 2021-12-17

## 2021-12-16 MED ORDER — DILTIAZEM 12 MG/ML ORAL SUSPENSION
30.0000 mg | Freq: Three times a day (TID) | ORAL | Status: DC
  Administered 2021-12-17 – 2021-12-18 (×4): 30 mg via ORAL
  Filled 2021-12-16 (×14): qty 2.5

## 2021-12-16 MED ORDER — VITAMIN K1 10 MG/ML IJ SOLN
5.0000 mg | Freq: Once | INTRAVENOUS | Status: AC
  Administered 2021-12-16: 5 mg via INTRAVENOUS
  Filled 2021-12-16: qty 0.5

## 2021-12-16 MED ORDER — DILTIAZEM HCL 25 MG/5ML IV SOLN: 10.0000 mg | Freq: Four times a day (QID) | INTRAVENOUS | Status: DC | PRN

## 2021-12-16 MED ORDER — FUROSEMIDE 10 MG/ML IJ SOLN
20.0000 mg | Freq: Once | INTRAMUSCULAR | Status: AC | PRN
  Administered 2021-12-16: 20 mg via INTRAVENOUS
  Filled 2021-12-16: qty 2

## 2021-12-16 MED ORDER — DILTIAZEM 12 MG/ML ORAL SUSPENSION
30.0000 mg | Freq: Four times a day (QID) | ORAL | Status: DC
  Filled 2021-12-16 (×8): qty 2.5

## 2021-12-16 NOTE — ED Triage Notes (Signed)
Pt presents with abnormal labs, low hgb performed at Dr. Garner Nash office in [place], [place].

## 2021-12-16 NOTE — ED Provider Notes (Signed)
[facility] EMERGENCY DEPARTMENT Provider Note   CSN: [id number] Arrival date & time: 12/16/21  1315     History  Chief Complaint  Patient presents with   Abnormal Lab    Chelsea [name] is a 66 y.o. female with a history most significant for Elita Boone induced liver cirrhosis, hypertension, type 2 diabetes, atrial fibrillation on chronic Coumadin therapy, CHF, hyperlipidemia, COPD with a history of prior iron deficiency anemia requiring transfusions presenting here for evaluation due to a low hemoglobin level.  She was seen by her primary MD yesterday and eating [place] who called her this morning to state that her hemoglobin was very low and she needed to come to the hospital for transfusion.  She does not recall her hemoglobin level.  She states she last required a transfusion last November.  He does endorse generalized fatigue, shortness of breath with significant exertion, transient lightheadedness when she stands too quickly, she denies chest pain, no unexplained bruising or bleeding, denies bloody stools.  She does not use NSAIDs.  The history is provided by the patient.      Home Medications Prior to Admission medications   Medication Sig Start Date End Date Taking? Authorizing Provider  diltiazem (CARDIZEM) 60 MG tablet Take 60 mg by mouth daily as needed (palpitations). No more than one per 24 hours   Yes [provider]  furosemide (LASIX) 40 MG tablet TAKE 1 TABLET BY MOUTH EVERY DAY AS NEEDED Patient taking differently: Take 40 mg by mouth daily. 07/10/19  Yes Branch, Dorothe Pea, MD  metFORMIN (GLUCOPHAGE) 1000 MG tablet Take 1,000 mg by mouth 2 (two) times daily with a meal.   Yes [provider]  vitamin B-12 (CYANOCOBALAMIN) 1000 MCG tablet Take 1,000 mcg by mouth 3 (three) times a week.   Yes [provider]  Vitamin D, Ergocalciferol, (DRISDOL) 1.25 MG (50000 UNIT) CAPS capsule Take 50,000 Units by mouth every Monday.   Yes [provider]  warfarin (COUMADIN) 6 MG tablet Take 6 mg by mouth daily. Take 6 mg all days. 07/23/15  Yes [provider]  diltiazem (CARDIZEM CD) 240 MG 24 hr capsule TAKE ONE CAPSULE BY MOUTH EVERY DAY Patient not taking: Reported on 12/16/2021 03/28/20   Antoine Poche, MD      Allergies    Bee venom and Lisinopril    Review of Systems   Review of Systems  Constitutional:  Positive for fatigue. Negative for fever.  HENT:  Negative for congestion.   Eyes: Negative.   Respiratory:  Negative for chest tightness and shortness of breath.   Cardiovascular:  Negative for chest pain.  Gastrointestinal:  Negative for abdominal pain, blood in stool, nausea and vomiting.  Genitourinary: Negative.   Musculoskeletal:  Negative for arthralgias, joint swelling and neck pain.  Skin: Negative.  Negative for rash and wound.  Neurological:  Positive for weakness and light-headedness. Negative for dizziness, numbness and headaches.  Psychiatric/Behavioral: Negative.    All other systems reviewed and are negative.  Physical Exam Updated Vital Signs BP 127/74   Pulse (!) 109   Temp 98 F (36.7 C) (Oral)   Resp 17   Ht 5\' 7"  (1.702 m)   Wt 104.3 kg   SpO2 100%   BMI 36.02 kg/m  Physical Exam Vitals and nursing note reviewed.  Constitutional:      Appearance: She is well-developed.  HENT:     Head: Normocephalic and atraumatic.     Mouth/Throat:  Pharynx: Oropharynx is clear.     Comments: Pale tongue and buccal mucosa. Eyes:     Conjunctiva/sclera: Conjunctivae normal.  Cardiovascular:     Rate and Rhythm: Regular rhythm. Tachycardia present.     Heart sounds: Normal heart sounds.     Comments: Of note she has not taken her Cardizem today for her rate control. Pulmonary:     Effort: Pulmonary effort is normal.     Breath sounds: Normal breath sounds. No wheezing.  Abdominal:     General: Bowel sounds are normal.     Palpations: Abdomen is soft.     Tenderness:  There is no abdominal tenderness.  Musculoskeletal:        General: Normal range of motion.     Cervical back: Normal range of motion.  Skin:    General: Skin is warm and dry.  Neurological:     Mental Status: She is alert.    ED Results / Procedures / Treatments   Labs (all labs ordered are listed, but only abnormal results are displayed) Labs Reviewed  BASIC METABOLIC PANEL - Abnormal; Notable for the following components:      Result Value   Potassium 3.2 (*)    Glucose, Bld 231 (*)    Creatinine, Ser 1.07 (*)    GFR, Estimated 58 (*)    All other components within normal limits  CBC - Abnormal; Notable for the following components:   RBC 2.18 (*)    Hemoglobin 4.0 (*)    HCT 14.9 (*)    MCV 68.3 (*)    MCH 18.3 (*)    MCHC 26.8 (*)    RDW 18.1 (*)    Platelets 83 (*)    All other components within normal limits  PROTIME-INR - Abnormal; Notable for the following components:   Prothrombin Time 50.5 (*)    INR 5.6 (*)    All other components within normal limits  CBG MONITORING, ED - Abnormal; Notable for the following components:   Glucose-Capillary 213 (*)    All other components within normal limits  URINALYSIS, ROUTINE W REFLEX MICROSCOPIC  HEMOGLOBIN A1C  POC OCCULT BLOOD, ED  TYPE AND SCREEN  PREPARE RBC (CROSSMATCH)  ABO/RH    EKG None  Radiology No results found.  Procedures Procedures    Medications Ordered in ED Medications  0.9 %  sodium chloride infusion (has no administration in time range)  0.9 %  sodium chloride infusion (has no administration in time range)  octreotide (SANDOSTATIN) 2 mcg/mL load via infusion 50 mcg (50 mcg Intravenous Bolus from Bag 12/16/21 1657)    And  octreotide (SANDOSTATIN) 500 mcg in sodium chloride 0.9 % 250 mL (2 mcg/mL) infusion (50 mcg/hr Intravenous New Bag/Given 12/16/21 1702)  phytonadione (VITAMIN K) 5 mg in dextrose 5 % 50 mL IVPB (has no administration in time range)  diltiazem (CARDIZEM) 12 mg/mL oral  suspension 30 mg (has no administration in time range)  diltiazem (CARDIZEM) injection 10 mg (has no administration in time range)  furosemide (LASIX) injection 20 mg (has no administration in time range)  furosemide (LASIX) injection 20 mg (has no administration in time range)  pantoprazole (PROTONIX) 80 mg /NS 100 mL IVPB (has no administration in time range)  pantoprozole (PROTONIX) 80 mg /NS 100 mL infusion (has no administration in time range)  pantoprazole (PROTONIX) injection 40 mg (has no administration in time range)  insulin aspart (novoLOG) injection 0-9 Units (has no administration in time range)  diltiazem (  CARDIZEM) tablet 60 mg (60 mg Oral Given 12/16/21 1545)  pantoprazole (PROTONIX) injection 40 mg (40 mg Intravenous Given 12/16/21 1711)    ED Course/ Medical Decision Making/ A&P                           Medical Decision Making Patient with a history of Elita Booneash induced cirrhosis, documented esophageal varices with banding procedures in place under the care of Dr. Levon Hedgerastaneda, presenting here for anemia which was discovered in her PCPs office yesterday.  Her hemoglobin today is 4.0.  She denies seeing any rectal bleeding, bruising or unexplained other bleeding, however she is strongly Hemoccult positive on today's exam.  Additionally she is on Coumadin secondary to atrial fibrillation and her INR is supratherapeutic at 5.6.  She woke require obvious admission for PRBC transfusions, correction of her INR and further evaluation of her occult GI bleed.  Amount and/or Complexity of Data Reviewed Labs: ordered. Discussion of management or test interpretation with external provider(s): Call placed to Dr. Karilyn Cotaehman with GI.  He will plan for GI consult in the morning.  In the interim he has requested 2 units of FFP to reverse the Coumadin as vitamin K would not be effective in this cirrhotic patient.  He also has asked for octreotide and PPI and for patient to remain n.p.o. until GI consult in  the morning.  Of course PRBCs have already been ordered, 3 units,  Call placed to the hospitalist service.  Discussed pt with Dr. Randol KernElgergawy who accepts pt for admission.  Risk Decision regarding hospitalization.    CRITICAL CARE Performed by: Burgess AmorJulie Corbin Hott Total critical care time: 45 minutes Critical care time was exclusive of separately billable procedures and treating other patients. Critical care was necessary to treat or prevent imminent or life-threatening deterioration. Critical care was time spent personally by me on the following activities: development of treatment plan with patient and/or surrogate as well as nursing, discussions with consultants, evaluation of patient's response to treatment, examination of patient, obtaining history from patient or surrogate, ordering and performing treatments and interventions, ordering and review of laboratory studies, ordering and review of radiographic studies, pulse oximetry and re-evaluation of patient's condition.          Final Clinical Impression(s) / ED Diagnoses Final diagnoses:  Acute blood loss anemia  Liver cirrhosis secondary to NASH [facility](HCC)  Atrial fibrillation, unspecified type (HCC)  Supratherapeutic INR    Rx / DC Orders ED Discharge Orders     None         Victoriano Laindol, Azul Brumett, PA-C 12/16/21 1716    Vanetta MuldersZackowski, Scott, MD 01/01/22 1531
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
## 2021-12-17 ENCOUNTER — Encounter (HOSPITAL_COMMUNITY): Payer: Self-pay | Admitting: Internal Medicine

## 2021-12-17 ENCOUNTER — Inpatient Hospital Stay (HOSPITAL_COMMUNITY): Payer: No Typology Code available for payment source | Admitting: Certified Registered Nurse Anesthetist

## 2021-12-17 ENCOUNTER — Encounter (HOSPITAL_COMMUNITY)
Admission: EM | Disposition: A | Discharge status: Home / Self Care | Payer: Self-pay | Source: Home / Self Care | Attending: Family Medicine

## 2021-12-17 DIAGNOSIS — I8510 Secondary esophageal varices without bleeding: Secondary | ICD-10-CM | POA: Diagnosis not present

## 2021-12-17 DIAGNOSIS — I10 Essential (primary) hypertension: Secondary | ICD-10-CM

## 2021-12-17 DIAGNOSIS — E119 Type 2 diabetes mellitus without complications: Secondary | ICD-10-CM | POA: Diagnosis not present

## 2021-12-17 DIAGNOSIS — K766 Portal hypertension: Secondary | ICD-10-CM | POA: Diagnosis not present

## 2021-12-17 DIAGNOSIS — D62 Acute posthemorrhagic anemia: Secondary | ICD-10-CM | POA: Diagnosis not present

## 2021-12-17 DIAGNOSIS — I482 Chronic atrial fibrillation, unspecified: Secondary | ICD-10-CM | POA: Diagnosis not present

## 2021-12-17 DIAGNOSIS — Z7901 Long term (current) use of anticoagulants: Secondary | ICD-10-CM | POA: Diagnosis not present

## 2021-12-17 HISTORY — PX: ESOPHAGOGASTRODUODENOSCOPY (EGD) WITH PROPOFOL: SHX5813

## 2021-12-17 HISTORY — PX: ESOPHAGEAL BANDING: SHX5518

## 2021-12-17 LAB — GLUCOSE, CAPILLARY
Glucose-Capillary: 107 mg/dL — ABNORMAL HIGH (ref 70–99)
Glucose-Capillary: 110 mg/dL — ABNORMAL HIGH (ref 70–99)
Glucose-Capillary: 130 mg/dL — ABNORMAL HIGH (ref 70–99)
Glucose-Capillary: 146 mg/dL — ABNORMAL HIGH (ref 70–99)
Glucose-Capillary: 151 mg/dL — ABNORMAL HIGH (ref 70–99)
Glucose-Capillary: 157 mg/dL — ABNORMAL HIGH (ref 70–99)

## 2021-12-17 LAB — COMPREHENSIVE METABOLIC PANEL
ALT: 23 U/L (ref 0–44)
AST: 54 U/L — ABNORMAL HIGH (ref 15–41)
Albumin: 2.5 g/dL — ABNORMAL LOW (ref 3.5–5.0)
Alkaline Phosphatase: 76 U/L (ref 38–126)
Anion gap: 3 — ABNORMAL LOW (ref 5–15)
BUN: 14 mg/dL (ref 8–23)
CO2: 25 mmol/L (ref 22–32)
Calcium: 8.9 mg/dL (ref 8.9–10.3)
Chloride: 110 mmol/L (ref 98–111)
Creatinine, Ser: 1.15 mg/dL — ABNORMAL HIGH (ref 0.44–1.00)
GFR, Estimated: 54 mL/min — ABNORMAL LOW (ref >60)
Glucose, Bld: 136 mg/dL — ABNORMAL HIGH (ref 70–99)
Potassium: 3.8 mmol/L (ref 3.5–5.1)
Sodium: 138 mmol/L (ref 135–145)
Total Bilirubin: 2.7 mg/dL — ABNORMAL HIGH (ref 0.3–1.2)
Total Protein: 6.9 g/dL (ref 6.5–8.1)

## 2021-12-17 LAB — CBC
HCT: 23 % — ABNORMAL LOW (ref 36.0–46.0)
Hemoglobin: 7.1 g/dL — ABNORMAL LOW (ref 12.0–15.0)
MCH: 23.1 pg — ABNORMAL LOW (ref 26.0–34.0)
MCHC: 30.9 g/dL (ref 30.0–36.0)
MCV: 74.9 fL — ABNORMAL LOW (ref 80.0–100.0)
Platelets: 74 10*3/uL — ABNORMAL LOW (ref 150–400)
RBC: 3.07 MIL/uL — ABNORMAL LOW (ref 3.87–5.11)
RDW: 20.3 % — ABNORMAL HIGH (ref 11.5–15.5)
WBC: 5.3 10*3/uL (ref 4.0–10.5)
nRBC: 0 % (ref 0.0–0.2)

## 2021-12-17 LAB — PROTIME-INR
INR: 2.1 — ABNORMAL HIGH (ref 0.8–1.2)
Prothrombin Time: 23.2 seconds — ABNORMAL HIGH (ref 11.4–15.2)

## 2021-12-17 LAB — PREPARE RBC (CROSSMATCH)

## 2021-12-17 LAB — HIV ANTIBODY (ROUTINE TESTING W REFLEX): HIV Screen 4th Generation wRfx: NONREACTIVE

## 2021-12-17 SURGERY — ESOPHAGOGASTRODUODENOSCOPY (EGD) WITH PROPOFOL
Anesthesia: General

## 2021-12-17 MED ORDER — LIDOCAINE HCL (CARDIAC) PF 100 MG/5ML IV SOSY
PREFILLED_SYRINGE | INTRAVENOUS | Status: DC | PRN
  Administered 2021-12-17: 60 mg via INTRATRACHEAL

## 2021-12-17 MED ORDER — ACETAMINOPHEN 325 MG PO TABS: 650.0000 mg | ORAL_TABLET | Freq: Three times a day (TID) | ORAL | Status: DC | PRN

## 2021-12-17 MED ORDER — PANTOPRAZOLE SODIUM 40 MG PO TBEC
40.0000 mg | DELAYED_RELEASE_TABLET | Freq: Every day | ORAL | Status: DC
  Administered 2021-12-17 – 2021-12-21 (×5): 40 mg via ORAL
  Filled 2021-12-17 (×5): qty 1

## 2021-12-17 MED ORDER — STERILE WATER FOR IRRIGATION IR SOLN: Status: DC | PRN

## 2021-12-17 MED ORDER — LIDOCAINE VISCOUS HCL 2 % MT SOLN
OROMUCOSAL | Status: AC
Start: 2021-12-17 — End: 2021-12-17
  Filled 2021-12-17: qty 15

## 2021-12-17 MED ORDER — ACETAMINOPHEN 650 MG RE SUPP
650.0000 mg | Freq: Four times a day (QID) | RECTAL | Status: DC | PRN
Start: 2021-12-17 — End: 2021-12-21

## 2021-12-17 MED ORDER — SODIUM CHLORIDE 0.9 % IV SOLN: INTRAVENOUS | Status: DC

## 2021-12-17 MED ORDER — LACTATED RINGERS IV SOLN
INTRAVENOUS | Status: DC
  Administered 2021-12-17: 1000 mL via INTRAVENOUS

## 2021-12-17 MED ORDER — PROPOFOL 10 MG/ML IV BOLUS
INTRAVENOUS | Status: DC | PRN
Start: 2021-12-17 — End: 2021-12-17
  Administered 2021-12-17: 20 mg via INTRAVENOUS
  Administered 2021-12-17: 100 mg via INTRAVENOUS
  Administered 2021-12-17: 20 mg via INTRAVENOUS

## 2021-12-17 MED ORDER — SODIUM CHLORIDE 0.9 % IV SOLN
1.0000 g | INTRAVENOUS | Status: AC
  Administered 2021-12-17 – 2021-12-21 (×5): 1 g via INTRAVENOUS
  Filled 2021-12-17 (×5): qty 10

## 2021-12-17 MED ORDER — LIDOCAINE VISCOUS HCL 2 % MT SOLN
15.0000 mL | Freq: Once | OROMUCOSAL | Status: AC
Start: 2021-12-17 — End: 2021-12-17
  Administered 2021-12-17: 15 mL via OROMUCOSAL

## 2021-12-17 MED ORDER — PROPOFOL 500 MG/50ML IV EMUL
INTRAVENOUS | Status: DC | PRN
Start: 2021-12-17 — End: 2021-12-17
  Administered 2021-12-17: 200 ug/kg/min via INTRAVENOUS

## 2021-12-17 MED ORDER — CHLORHEXIDINE GLUCONATE CLOTH 2 % EX PADS
6.0000 | MEDICATED_PAD | Freq: Every day | CUTANEOUS | Status: DC
  Administered 2021-12-17 – 2021-12-18 (×2): 6 via TOPICAL

## 2021-12-17 MED ORDER — VITAMIN B-12 1000 MCG PO TABS
1000.0000 ug | ORAL_TABLET | ORAL | Status: DC
  Administered 2021-12-18 – 2021-12-21 (×2): 1000 ug via ORAL
  Filled 2021-12-17 (×3): qty 1

## 2021-12-17 NOTE — Assessment & Plan Note (Addendum)
--   temporarily holding warfarin in setting of GI bleeding and severe anemia  -- s/p 2 units ffp and vitamin K -- GI requested holding warfarin until after next EGD in 1-2 weeks

## 2021-12-17 NOTE — Hospital Course (Signed)
66 y.o. female, with past medical history significant for [facility] liver cirrhosis, hypertension, type 2 diabetes mellitus, atrial fibrillation on warfarin, CHF, hyperlipidemia, COPD, iron deficiency anemia requiring transfusions, vaginal varices status post banding, patient was sent by her PCP for low hemoglobin level, patient had a visit with her PCP yesterday, where she was called this morning she was asked to come to ED for transfusion given very low hemoglobin, she reports she required transfusion last November, she does report generalized weakness, fatigue, out of energy, short of breath with exertion, and some lightheadedness as well, she denies any chest pain, focal deficits, she denies any nausea, vomiting or coffee-ground emesis, she does report dark-colored stools over , but no bright red blood per rectum, she denies any NSAIDs use.  In ED labs were significant for hemoglobin of 4, and INR of 5.6, potassium low at 3.2, glucose elevated at 231, her platelet were low at 53k, her EKG showing A-fib with RVR heart rate of 123, [facility] hospitalist consulted to admit.

## 2021-12-17 NOTE — Anesthesia Preprocedure Evaluation (Signed)
Anesthesia Evaluation  Patient identified by MRN, date of birth, ID band Patient awake    Reviewed: Allergy & Precautions, NPO status , Patient's Chart, lab work & pertinent test results  History of Anesthesia Complications (+) Family history of anesthesia reaction and history of anesthetic complications  Airway Mallampati: II  TM Distance: >3 FB Neck ROM: Full    Dental  (+) Dental Advisory Given, Missing   Pulmonary sleep apnea , COPD,    Pulmonary exam normal breath sounds clear to auscultation       Cardiovascular Exercise Tolerance: Poor hypertension, Pt. on medications +CHF  Normal cardiovascular exam+ dysrhythmias Atrial Fibrillation  Rhythm:Regular Rate:Normal     Neuro/Psych Subdural hematoma CVA negative psych ROS   GI/Hepatic negative GI ROS, (+) Cirrhosis   Esophageal Varices    , Hepatitis -  Endo/Other  diabetes, Well Controlled, Type 2, Oral Hypoglycemic Agents  Renal/GU negative Renal ROS  negative genitourinary   Musculoskeletal negative musculoskeletal ROS (+)   Abdominal   Peds negative pediatric ROS (+)  Hematology  (+) Blood dyscrasia, anemia ,   Anesthesia Other Findings   Reproductive/Obstetrics negative OB ROS                            Anesthesia Physical Anesthesia Plan  ASA: 4  Anesthesia Plan: General   Post-op Pain Management: Minimal or no pain anticipated   Induction: Intravenous  PONV Risk Score and Plan: Propofol infusion  Airway Management Planned: Nasal Cannula and Natural Airway  Additional Equipment:   Intra-op Plan:   Post-operative Plan: Possible Post-op intubation/ventilation  Informed Consent: I have reviewed the patients History and Physical, chart, labs and discussed the procedure including the risks, benefits and alternatives for the proposed anesthesia with the patient or authorized representative who has indicated his/her  understanding and acceptance.    Discussed DNR with patient.   Dental advisory given  Plan Discussed with: CRNA and Surgeon  Anesthesia Plan Comments:        Anesthesia Quick Evaluation

## 2021-12-17 NOTE — Assessment & Plan Note (Addendum)
--   offered CPAP therapy in hospital

## 2021-12-17 NOTE — Op Note (Signed)
[facility] Patient Name: Chelsea [name] Procedure Date: 12/17/2021 3:36 PM MRN: [id number] Date of Birth: 1956-03-18 Attending MD: Norvel Richards , MD CSN: [id number] Age: 66 Admit Type: Inpatient Procedure:                Upper GI endoscopy Indications:              Occult blood in stool Providers:                Norvel Richards, MD, Gwynneth Albright RN,                            RN, Suzan Garibaldi. Risa Grill, Technician Referring MD:              Medicines:                Propofol per Anesthesia Complications:            No immediate complications. Estimated Blood Loss:     Estimated blood loss was minimal. Procedure:                Pre-Anesthesia Assessment:                           - Prior to the procedure, a History and Physical                            was performed, and patient medications and                            allergies were reviewed. The patient's tolerance of                            previous anesthesia was also reviewed. The risks                            and benefits of the procedure and the sedation                            options and risks were discussed with the patient.                            All questions were answered, and informed consent                            was obtained. Prior Anticoagulants: The patient                            last took Coumadin (warfarin) 3 days prior to the                            procedure. ASA Grade Assessment: IV - A patient                            with severe systemic disease that is a constant  threat to life. After reviewing the risks and                            benefits, the patient was deemed in satisfactory                            condition to undergo the procedure.                           After obtaining informed consent, the endoscope was                            passed under direct vision. Throughout the                             procedure, the patient's blood pressure, pulse, and                            oxygen saturations were monitored continuously. The                            [id number] scope was introduced through the                            mouth, and advanced to the second part of duodenum.                            The upper GI endoscopy was accomplished without                            difficulty. The patient tolerated the procedure                            well. Scope In: 4:03:39 PM Scope Out: 4:14:03 PM Total Procedure Duration: 0 hours 10 minutes 24 seconds  Findings:      Patient had multilobulated grade 3 varices extending from the GE       junction up 8 cm into the tubular esophagus. On 2 columns, had both       cherry red spots and wale marks.      Mild portal hypertensive gastropathy was found in the entire examined       stomach. No gastric varices, infiltrative process or ulcer seen. Pylorus       patent.      The duodenal bulb and second portion of the duodenum were normal.      Scope was withdrawn Microvasive 7 shot bander was loaded up and       introduced into the esophagus. In helical fashion, from the GE junction,       1 band each placed on the 3 columns. Good hemostasis maintained with       these maneuvers. Impression:               - Grade 3 esophageal varices with bleeding stigmata                            -  status post EBL                           -Portal hypertensive gastropathy.                           - Normal duodenal bulb and second portion of the                            duodenum.                           - No specimens collected. Moderate Sedation:      Moderate (conscious) sedation was personally administered by an       anesthesia professional. The following parameters were monitored: oxygen       saturation, heart rate, blood pressure, respiratory rate, EKG, adequacy       of pulmonary ventilation, and response to care. Recommendation:            - Patient has a contact number available for                            emergencies. The signs and symptoms of potential                            delayed complications were discussed with the                            patient. Return to normal activities tomorrow.                            Written discharge instructions were provided to the                            patient.                           - Return patient to ICU for ongoing care.                           - Clear liquid diet. Once daily PPI. Octreotide x72                            hours; IV Rocephin x5 days. Repeat EGD in 1 to 2                            weeks. Would favor leaving her off Coumadin until                            after the next endoscopic session has been performed                           - Consider adding nonselective beta-blockade to her  regimen after repeat banding session.                           - Continue present medications. I discussed my                            findings and recommendations with the patient's                            sister, Chelsea [name], via telephone.                           - Procedure Code(s):        --- Professional ---                           [phone], Esophagogastroduodenoscopy, flexible,                            transoral; diagnostic, including collection of                            specimen(s) by brushing or washing, when performed                            (separate procedure) Diagnosis Code(s):        --- Professional ---                           K76.6, Portal hypertension                           K31.89, Other diseases of stomach and duodenum                           R19.5, Other fecal abnormalities CPT copyright 2019 [employer]. All rights reserved. The codes documented in this report are preliminary and upon coder review may  be revised to meet current compliance requirements. Cristopher Estimable. Caitriona Sundquist,  MD Norvel Richards, MD 12/17/2021 4:26:16 PM This report has been signed electronically. Number of Addenda: 0
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
## 2021-12-17 NOTE — Assessment & Plan Note (Addendum)
--   HR much better controlled now on oral diltiazem.  -- GI requested to hold warfarin until repeat EGD in 1-2 weeks

## 2021-12-17 NOTE — Assessment & Plan Note (Addendum)
--   appreciate GI consult and recommendations -- pt is being referred for liver transplant by GI service

## 2021-12-17 NOTE — TOC Progression Note (Signed)
  Transition of Care [facility]) Screening Note   Patient Details  Name: Chelsea [name] Date of Birth: November 21, 1955   Transition of Care [facility]) CM/SW Contact:    Shade Flood, LCSW Phone Number: 12/17/2021, 11:34 AM    Transition of Care Department [facility]) has reviewed patient and no TOC needs have been identified at this time. We will continue to monitor patient advancement through interdisciplinary progression rounds. If new patient transition needs arise, please place a TOC consult.

## 2021-12-17 NOTE — Assessment & Plan Note (Addendum)
--   presumed upper GI bleed in setting of supratherapeutic PT/INR  -- continue octreotide and protonix  -- EGD later today.  -- s/p 3 units PRBC with Hg up to 7.1.  2 additional units PRBC given 5/26, Hg up to 9. -- s/p vitamin K and 2 units FFP

## 2021-12-17 NOTE — Anesthesia Postprocedure Evaluation (Signed)
Anesthesia Post Note  Patient: Chelsea [name]  Procedure(s) Performed: ESOPHAGOGASTRODUODENOSCOPY (EGD) WITH PROPOFOL ESOPHAGEAL BANDING  Patient location during evaluation: PACU Anesthesia Type: General Level of consciousness: awake and alert and oriented Pain management: pain level controlled Vital Signs Assessment: post-procedure vital signs reviewed and stable Respiratory status: spontaneous breathing, nonlabored ventilation and respiratory function stable Cardiovascular status: blood pressure returned to baseline and stable Postop Assessment: no apparent nausea or vomiting Anesthetic complications: no   No notable events documented.   Last Vitals:  Vitals:   12/17/21 1621 12/17/21 1630  BP: 133/68 123/69  Pulse: 87 87  Resp: 19 (!) 23  Temp: 36.5 C 36.5 C  SpO2: 100% 100%    Last Pain:  Vitals:   12/17/21 1630  TempSrc:   PainSc: 0-No pain                 Kate Sweetman C Cacey Willow

## 2021-12-17 NOTE — Assessment & Plan Note (Addendum)
--   Remains on IV octreotide infusion x 72 hours -- GI following EGD with Dr. Jena Gauss: she had grade 3 esophageal varices with bleeding stigmata s/p banding and diffuse portal hypertensive gastropathy -- she completed ceftriaxone for SBP prophylaxis x 5 days and once daily protonix started.  -- I spoke with Dr. Jena Gauss, he agrees patient can DC and have outpatient EGD.  He made arrangements with Dr. Levon Hedger to get her in for EGD in next 1-2 weeks.  Pt was instructed to hold warfarin until she has completed repeat EGD and ok with her GI physician to restart.  Pt verbalized understanding.

## 2021-12-17 NOTE — Progress Notes (Signed)
PROGRESS NOTE   Chelsea [name]  [id number] DOB: 1956/04/14 DOA: 12/16/2021 PCP: [name], MD   Chief Complaint  Patient presents with   Abnormal Lab   Level of care: ICU  Brief Admission History:  66 y.o. female, with past medical history significant for [name] liver cirrhosis, hypertension, type 2 diabetes mellitus, atrial fibrillation on warfarin, CHF, hyperlipidemia, COPD, iron deficiency anemia requiring transfusions, vaginal varices status post banding, patient was sent by her PCP for low hemoglobin level, patient had a visit with her PCP yesterday, where she was called this morning she was asked to come to ED for transfusion given very low hemoglobin, she reports she required transfusion last November, she does report generalized weakness, fatigue, out of energy, short of breath with exertion, and some lightheadedness as well, she denies any chest pain, focal deficits, she denies any nausea, vomiting or coffee-ground emesis, she does report dark-colored stools over , but no bright red blood per rectum, she denies any NSAIDs use.  In ED labs were significant for hemoglobin of 4, and INR of 5.6, potassium low at 3.2, glucose elevated at 231, her platelet were low at 53k, her EKG showing A-fib with RVR heart rate of 123, [facility] hospitalist consulted to admit.   Assessment and Plan: * Acute blood loss anemia -- presumed upper GI bleed in setting of supratherapeutic PT/INR  -- continue octreotide and protonix  -- EGD later today.  -- s/p 3 units PRBC with Hg up to 7.1.  -- 2 units PRBC for EGD today.   -- s/p vitamin K and 2 units FFP  Esophageal varices (HCC) -- Remains on IV octreotide infusion  -- GI planning EGD with Dr. Gala Romney later today  Liver cirrhosis secondary to NASH [facility]) -- appreciate GI consult and recommendations -- resumed oral lasix and spironolactone -- pt is being referred for liver transplant   Sleep apnea -- will offer CPAP therapy   Chronic  anticoagulation -- temporarily holding warfarin in setting of GI bleeding and severe anemia  -- s/p 2 units ffp and vitamin K  Atrial fibrillation, chronic (HCC) -- HR much better controlled now on oral diltiazem. Continue for now.  Possibly can transition to long acting on 5/26    DVT prophylaxis: SCDs Code Status: DNR  Family Communication:  Disposition: Status is: Inpatient Remains inpatient appropriate because: IV infusions required    Consultants:  GI  Procedures:  EGD pending  Antimicrobials:  Ceftriaxone 5/25>>  Subjective: Pt reports that she feels overall weak but no abdominal pain, no chest pain and no SOB.  Objective: Vitals:   12/17/21 0900 12/17/21 0915 12/17/21 1100 12/17/21 1203  BP:  (!) 137/55    Pulse: 91 86 78   Resp: 18 (!) 21 (!) 21   Temp:    97.8 F (36.6 C)  TempSrc:    Oral  SpO2: 100% 100% 100%   Weight:      Height:        Intake/Output Summary (Last 24 hours) at 12/17/2021 1205 Last data filed at 12/17/2021 [id number] Gross per 24 hour  Intake 2576.68 ml  Output --  Net 2576.68 ml   Filed Weights   12/16/21 1327 12/16/21 1800 12/16/21 1815  Weight: 104.3 kg 101.2 kg 101.4 kg   Examination:  General exam: Appears calm and comfortable  Respiratory system: Clear to auscultation. Respiratory effort normal. Cardiovascular system: normal S1 & S2 heard. No JVD, murmurs, rubs, gallops or clicks. No pedal edema. Gastrointestinal system: Abdomen  is nondistended, soft and nontender. No organomegaly or masses felt. Normal bowel sounds heard. Central nervous system: Alert and oriented. No focal neurological deficits. Extremities: Symmetric 5 x 5 power. Skin: No rashes, lesions or ulcers. Psychiatry: Judgement and insight appear normal. Mood & affect appropriate.   Data Reviewed: I have personally reviewed following labs and imaging studies  CBC: Recent Labs  Lab 12/16/21 1517 12/17/21 0914  WBC 4.6 5.3  HGB 4.0* 7.1*  HCT 14.9* 23.0*  MCV  68.3* 74.9*  PLT 83* 74*    Basic Metabolic Panel: Recent Labs  Lab 12/16/21 1517 12/17/21 0914  NA 135 138  K 3.2* 3.8  CL 106 110  CO2 24 25  GLUCOSE 231* 136*  BUN 15 14  CREATININE 1.07* 1.15*  CALCIUM 9.2 8.9  MG 1.6*  --     CBG: Recent Labs  Lab 12/16/21 2015 12/16/21 2130 12/17/21 0018 12/17/21 0444 12/17/21 0722  GLUCAP 177* 162* 151* 146* 130*    Recent Results (from the past 240 hour(s))  MRSA Next Gen by PCR, Nasal     Status: None   Collection Time: 12/16/21  6:03 PM   Specimen: Nasal Mucosa; Nasal Swab  Result Value Ref Range Status   MRSA by PCR Next Gen NOT DETECTED NOT DETECTED Final    Comment: (NOTE) The GeneXpert MRSA Assay (FDA approved for NASAL specimens only), is one component of a comprehensive MRSA colonization surveillance program. It is not intended to diagnose MRSA infection nor to guide or monitor treatment for MRSA infections. Test performance is not FDA approved in patients less than 54 years old. Performed at [facility], [address] [place], [place] [address]      Radiology Studies: No results found.  Scheduled Meds:  Chlorhexidine Gluconate Cloth  6 each Topical Daily   diltiazem  30 mg Oral Q8H   insulin aspart  0-9 Units Subcutaneous Q4H   [START ON 12/20/2021] pantoprazole  40 mg Intravenous Q12H   [START ON 12/18/2021] vitamin B-12  1,000 mcg Oral Once per day on Mon Wed Fri   Continuous Infusions:  cefTRIAXone (ROCEPHIN)  IV     octreotide  (SANDOSTATIN)    IV infusion 50 mcg/hr (12/17/21 0531)   pantoprazole 8 mg/hr (12/17/21 1048)     LOS: 1 day   Critical Care Procedure Note Authorized and Performed by: Murvin Natal MD  Total Critical Care time:  41 mins  Due to a high probability of clinically significant, life threatening deterioration, the patient required my highest level of preparedness to intervene emergently and I personally spent this critical care time directly and personally managing the patient.   This critical care time included obtaining a history; examining the patient, pulse oximetry; ordering and review of studies; arranging urgent treatment with development of a management plan; evaluation of patient's response of treatment; frequent reassessment; and discussions with other providers.  This critical care time was performed to assess and manage the high probability of imminent and life threatening deterioration that could result in multi-organ failure.  It was exclusive of separately billable procedures and treating other patients and teaching time.    Irwin Brakeman, MD How to contact the [facility] Attending or Consulting provider [place] or covering provider during after hours [place], for this patient?  Check the care team in [facility] and look for a) attending/consulting TRH provider listed and b) the [facility] team listed Log into [email or web address] and use [place]'s universal password to access. If you  do not have the password, please contact the hospital operator. Locate the [facility] provider you are looking for under [facility] and page to a number that you can be directly reached. If you still have difficulty reaching the provider, please page the [facility] (Director on Call) for the Hospitalists listed on amion for assistance.  12/17/2021, 12:05 PM

## 2021-12-17 NOTE — Transfer of Care (Signed)
Immediate Anesthesia Transfer of Care Note  Patient: Chelsea [name]  Procedure(s) Performed: ESOPHAGOGASTRODUODENOSCOPY (EGD) WITH PROPOFOL ESOPHAGEAL BANDING  Patient Location: PACU  Anesthesia Type:General  Level of Consciousness: awake, sedated and responds to stimulation  Airway & Oxygen Therapy: Patient Spontanous Breathing and Patient connected to nasal cannula oxygen  Post-op Assessment: Post -op Vital signs reviewed and stable  Post vital signs: Reviewed and stable  Last Vitals:  Vitals Value Taken Time  BP 133/68 12/17/21 1621  Temp 36.5 C 12/17/21 1621  Pulse 89 12/17/21 1625  Resp 23 12/17/21 1625  SpO2 100 % 12/17/21 1625  Vitals shown include unvalidated device data.  Last Pain:  Vitals:   12/17/21 1558  TempSrc:   PainSc: 0-No pain      Patients Stated Pain Goal: 6 (12/17/21 1447)  Complications: No notable events documented.

## 2021-12-18 DIAGNOSIS — Z7901 Long term (current) use of anticoagulants: Secondary | ICD-10-CM | POA: Diagnosis not present

## 2021-12-18 DIAGNOSIS — I482 Chronic atrial fibrillation, unspecified: Secondary | ICD-10-CM | POA: Diagnosis not present

## 2021-12-18 DIAGNOSIS — D62 Acute posthemorrhagic anemia: Secondary | ICD-10-CM | POA: Diagnosis not present

## 2021-12-18 DIAGNOSIS — E119 Type 2 diabetes mellitus without complications: Secondary | ICD-10-CM | POA: Diagnosis not present

## 2021-12-18 DIAGNOSIS — R791 Abnormal coagulation profile: Secondary | ICD-10-CM | POA: Diagnosis present

## 2021-12-18 LAB — BPAM FFP
Blood Product Expiration Date: 202305252209
Blood Product Expiration Date: 202305260337
ISSUE DATE / TIME: 202305242232
ISSUE DATE / TIME: 202305250416
Unit Type and Rh: 5100
Unit Type and Rh: 5100

## 2021-12-18 LAB — COMPREHENSIVE METABOLIC PANEL
ALT: 21 U/L (ref 0–44)
AST: 48 U/L — ABNORMAL HIGH (ref 15–41)
Albumin: 2.5 g/dL — ABNORMAL LOW (ref 3.5–5.0)
Alkaline Phosphatase: 71 U/L (ref 38–126)
Anion gap: 4 — ABNORMAL LOW (ref 5–15)
BUN: 15 mg/dL (ref 8–23)
CO2: 23 mmol/L (ref 22–32)
Calcium: 8.7 mg/dL — ABNORMAL LOW (ref 8.9–10.3)
Chloride: 112 mmol/L — ABNORMAL HIGH (ref 98–111)
Creatinine, Ser: 1.13 mg/dL — ABNORMAL HIGH (ref 0.44–1.00)
GFR, Estimated: 55 mL/min — ABNORMAL LOW (ref >60)
Glucose, Bld: 182 mg/dL — ABNORMAL HIGH (ref 70–99)
Potassium: 3.8 mmol/L (ref 3.5–5.1)
Sodium: 139 mmol/L (ref 135–145)
Total Bilirubin: 1.9 mg/dL — ABNORMAL HIGH (ref 0.3–1.2)
Total Protein: 6.7 g/dL (ref 6.5–8.1)

## 2021-12-18 LAB — PREPARE FRESH FROZEN PLASMA

## 2021-12-18 LAB — CBC
HCT: 23 % — ABNORMAL LOW (ref 36.0–46.0)
Hemoglobin: 6.9 g/dL — CL (ref 12.0–15.0)
MCH: 22.8 pg — ABNORMAL LOW (ref 26.0–34.0)
MCHC: 30 g/dL (ref 30.0–36.0)
MCV: 76.2 fL — ABNORMAL LOW (ref 80.0–100.0)
Platelets: 69 10*3/uL — ABNORMAL LOW (ref 150–400)
RBC: 3.02 MIL/uL — ABNORMAL LOW (ref 3.87–5.11)
RDW: 21.1 % — ABNORMAL HIGH (ref 11.5–15.5)
WBC: 4.9 10*3/uL (ref 4.0–10.5)
nRBC: 0 % (ref 0.0–0.2)

## 2021-12-18 LAB — GLUCOSE, CAPILLARY
Glucose-Capillary: 162 mg/dL — ABNORMAL HIGH (ref 70–99)
Glucose-Capillary: 164 mg/dL — ABNORMAL HIGH (ref 70–99)
Glucose-Capillary: 173 mg/dL — ABNORMAL HIGH (ref 70–99)
Glucose-Capillary: 190 mg/dL — ABNORMAL HIGH (ref 70–99)
Glucose-Capillary: 198 mg/dL — ABNORMAL HIGH (ref 70–99)
Glucose-Capillary: 265 mg/dL — ABNORMAL HIGH (ref 70–99)

## 2021-12-18 LAB — PREPARE RBC (CROSSMATCH)

## 2021-12-18 LAB — MAGNESIUM: Magnesium: 1.9 mg/dL (ref 1.7–2.4)

## 2021-12-18 LAB — HEMOGLOBIN AND HEMATOCRIT, BLOOD
HCT: 30.9 % — ABNORMAL LOW (ref 36.0–46.0)
Hemoglobin: 9.6 g/dL — ABNORMAL LOW (ref 12.0–15.0)

## 2021-12-18 LAB — ANTI-SMOOTH MUSCLE ANTIBODY, IGG: F-Actin IgG: 12 Units (ref 0–19)

## 2021-12-18 LAB — PROTIME-INR
INR: 1.9 — ABNORMAL HIGH (ref 0.8–1.2)
Prothrombin Time: 21.6 seconds — ABNORMAL HIGH (ref 11.4–15.2)

## 2021-12-18 LAB — MITOCHONDRIAL ANTIBODIES: Mitochondrial M2 Ab, IgG: <20 Units (ref 0.0–20.0)

## 2021-12-18 LAB — AFP TUMOR MARKER: AFP, Serum, Tumor Marker: <1.8 ng/mL (ref 0.0–9.2)

## 2021-12-18 LAB — HEMOGLOBIN A1C
Hgb A1c MFr Bld: 8.9 % — ABNORMAL HIGH (ref 4.8–5.6)
Mean Plasma Glucose: 209 mg/dL

## 2021-12-18 LAB — ANA: Anti Nuclear Antibody (ANA): NEGATIVE

## 2021-12-18 MED ORDER — DILTIAZEM HCL ER COATED BEADS 120 MG PO CP24
120.0000 mg | ORAL_CAPSULE | Freq: Every day | ORAL | Status: DC
  Administered 2021-12-18 – 2021-12-21 (×4): 120 mg via ORAL
  Filled 2021-12-18 (×4): qty 1

## 2021-12-18 MED ORDER — SODIUM CHLORIDE 0.9% IV SOLUTION: Freq: Once | INTRAVENOUS | Status: AC

## 2021-12-18 MED ORDER — BISOPROLOL FUMARATE 5 MG PO TABS
2.5000 mg | ORAL_TABLET | Freq: Every day | ORAL | Status: DC
  Administered 2021-12-18 – 2021-12-20 (×3): 2.5 mg via ORAL
  Filled 2021-12-18 (×3): qty 1

## 2021-12-18 MED ORDER — INSULIN ASPART 100 UNIT/ML IJ SOLN
0.0000 [IU] | Freq: Three times a day (TID) | INTRAMUSCULAR | Status: DC
  Administered 2021-12-19: 3 [IU] via SUBCUTANEOUS
  Administered 2021-12-19: 5 [IU] via SUBCUTANEOUS
  Administered 2021-12-19: 2 [IU] via SUBCUTANEOUS
  Administered 2021-12-20: 3 [IU] via SUBCUTANEOUS
  Administered 2021-12-20: 5 [IU] via SUBCUTANEOUS
  Administered 2021-12-20 – 2021-12-21 (×2): 2 [IU] via SUBCUTANEOUS
  Administered 2021-12-21: 3 [IU] via SUBCUTANEOUS

## 2021-12-18 MED ORDER — PHENOL 1.4 % MT LIQD
1.0000 | OROMUCOSAL | Status: DC | PRN
  Administered 2021-12-18: 1 via OROMUCOSAL
  Filled 2021-12-18: qty 177

## 2021-12-18 MED ORDER — FUROSEMIDE 10 MG/ML IJ SOLN
20.0000 mg | Freq: Once | INTRAMUSCULAR | Status: AC
  Administered 2021-12-18: 20 mg via INTRAVENOUS
  Filled 2021-12-18: qty 2

## 2021-12-18 NOTE — Progress Notes (Signed)
PROGRESS NOTE   Chelsea [name]  [id number] DOB: 07-Apr-1956 DOA: 12/16/2021 PCP: [name], MD   Chief Complaint  Patient presents with   Abnormal Lab   Level of care: Telemetry  Brief Admission History:  66 y.o. female, with past medical history significant for [name] liver cirrhosis, hypertension, type 2 diabetes mellitus, atrial fibrillation on warfarin, CHF, hyperlipidemia, COPD, iron deficiency anemia requiring transfusions, vaginal varices status post banding, patient was sent by her PCP for low hemoglobin level, patient had a visit with her PCP yesterday, where she was called this morning she was asked to come to ED for transfusion given very low hemoglobin, she reports she required transfusion last November, she does report generalized weakness, fatigue, out of energy, short of breath with exertion, and some lightheadedness as well, she denies any chest pain, focal deficits, she denies any nausea, vomiting or coffee-ground emesis, she does report dark-colored stools over , but no bright red blood per rectum, she denies any NSAIDs use.  In ED labs were significant for hemoglobin of 4, and INR of 5.6, potassium low at 3.2, glucose elevated at 231, her platelet were low at 53k, her EKG showing A-fib with RVR heart rate of 123, [facility] hospitalist consulted to admit.   Assessment and Plan: * Acute blood loss anemia -- presumed upper GI bleed in setting of supratherapeutic PT/INR  -- continue octreotide and protonix  -- EGD later today.  -- s/p 3 units PRBC with Hg up to 7.1.  2 additional units PRBC given 5/26, Hg up to 9. -- s/p vitamin K and 2 units FFP  Esophageal varices (HCC) -- Remains on IV octreotide infusion x 72 hours -- GI following EGD with Dr. Gala Romney: she had grade 3 esophageal varices with bleeding stigmata s/p banding and diffuse portal hypertensive gastropathy -- she is on ceftriaxone for SBP prophylaxis x 5 days and once daily protonix   Liver cirrhosis  secondary to NASH [facility]) -- appreciate GI consult and recommendations -- resumed oral lasix and spironolactone -- pt is being referred for liver transplant   Sleep apnea -- will offer CPAP therapy   Chronic anticoagulation -- temporarily holding warfarin in setting of GI bleeding and severe anemia  -- s/p 2 units ffp and vitamin K -- GI requested holding warfarin until after next EGD in 1-2 weeks  Atrial fibrillation, chronic (HCC) -- HR much better controlled now on oral diltiazem.  -- GI requested to hold warfarin until repeat EGD in 1-2 weeks  Essential hypertension, benign -- suboptimally controlled on diltiazem CD 120 mg  -- added bisoprolol 2.5 mg daily   Diabetes mellitus ([place]) -- well managed on SSI and frequent CBG monitoring CBG (last 3)  Recent Labs    12/18/21 0343 12/18/21 0748 12/18/21 1103  GLUCAP 164* 173* 190*      DVT prophylaxis: SCDs Code Status: DNR  Family Communication:  Disposition: Status is: Inpatient Remains inpatient appropriate because: IV infusions required    Consultants:  GI  Procedures:  EGD pending  Antimicrobials:  Ceftriaxone 5/25>>  Subjective: Pt reports she has sore throat since procedure.  Otherwise wanting to go home.   Objective: Vitals:   12/18/21 1104 12/18/21 1123 12/18/21 1216 12/18/21 1610  BP:  (!) 157/93 (!) 160/85 (!) 161/94  Pulse: 85 81 86 85  Resp: 19 (!) 22  20  Temp: 98 F (36.7 C) 98.2 F (36.8 C) 97.6 F (36.4 C) 98.4 F (36.9 C)  TempSrc: Oral Oral Oral Oral  SpO2: 100% 100% 100% 97%  Weight:      Height:        Intake/Output Summary (Last 24 hours) at 12/18/2021 1654 Last data filed at 12/18/2021 1519 Gross per 24 hour  Intake 1853.09 ml  Output --  Net 1853.09 ml   Filed Weights   12/16/21 1327 12/16/21 1800 12/16/21 1815  Weight: 104.3 kg 101.2 kg 101.4 kg   Examination:  General exam: Appears calm and comfortable  Respiratory system: Clear to auscultation. Respiratory effort  normal. Cardiovascular system: normal S1 & S2 heard. No JVD, murmurs, rubs, gallops or clicks. No pedal edema. Gastrointestinal system: Abdomen is nondistended, soft and nontender. No organomegaly or masses felt. Normal bowel sounds heard. Central nervous system: Alert and oriented. No focal neurological deficits. Extremities: Symmetric 5 x 5 power. Skin: No rashes, lesions or ulcers. Psychiatry: Judgement and insight appear normal. Mood & affect appropriate.   Data Reviewed: I have personally reviewed following labs and imaging studies  CBC: Recent Labs  Lab 12/16/21 1517 12/17/21 0914 12/18/21 0414 12/18/21 1313  WBC 4.6 5.3 4.9  --   HGB 4.0* 7.1* 6.9* 9.6*  HCT 14.9* 23.0* 23.0* 30.9*  MCV 68.3* 74.9* 76.2*  --   PLT 83* 74* 69*  --     Basic Metabolic Panel: Recent Labs  Lab 12/16/21 1517 12/17/21 0914 12/18/21 0414  NA 135 138 139  K 3.2* 3.8 3.8  CL 106 110 112*  CO2 24 25 23   GLUCOSE 231* 136* 182*  BUN 15 14 15   CREATININE 1.07* 1.15* 1.13*  CALCIUM 9.2 8.9 8.7*  MG 1.6*  --  1.9    CBG: Recent Labs  Lab 12/17/21 1938 12/18/21 0008 12/18/21 0343 12/18/21 0748 12/18/21 1103  GLUCAP 157* 162* 164* 173* 190*    Recent Results (from the past 240 hour(s))  MRSA Next Gen by PCR, Nasal     Status: None   Collection Time: 12/16/21  6:03 PM   Specimen: Nasal Mucosa; Nasal Swab  Result Value Ref Range Status   MRSA by PCR Next Gen NOT DETECTED NOT DETECTED Final    Comment: (NOTE) The GeneXpert MRSA Assay (FDA approved for NASAL specimens only), is one component of a comprehensive MRSA colonization surveillance program. It is not intended to diagnose MRSA infection nor to guide or monitor treatment for MRSA infections. Test performance is not FDA approved in patients less than 11 years old. Performed at [facility], [address] [place], [place] [address]      Radiology Studies: No results found.  Scheduled Meds:  bisoprolol  2.5 mg Oral Daily    Chlorhexidine Gluconate Cloth  6 each Topical Daily   diltiazem  120 mg Oral Daily   [START ON 12/19/2021] insulin aspart  0-9 Units Subcutaneous TID WC   pantoprazole  40 mg Oral Daily   vitamin B-12  1,000 mcg Oral Once per day on Mon Wed Fri   Continuous Infusions:  cefTRIAXone (ROCEPHIN)  IV Stopped (12/18/21 1203)   octreotide  (SANDOSTATIN)    IV infusion 50 mcg/hr (12/18/21 1519)     LOS: 2 days   Time spent: 49 mins   Habeeb Puertas Wynetta Emery, MD How to contact the [facility] Attending or Consulting provider [place] or covering provider during after hours [place], for this patient?  Check the care team in [facility] and look for a) attending/consulting TRH provider listed and b) the [facility] team listed Log into [email or web address] and use [place]'s universal password  to access. If you do not have the password, please contact the hospital operator. Locate the [facility] provider you are looking for under [facility] and page to a number that you can be directly reached. If you still have difficulty reaching the provider, please page the [facility] (Director on Call) for the Hospitalists listed on amion for assistance.  12/18/2021, 4:54 PM

## 2021-12-18 NOTE — Assessment & Plan Note (Signed)
--   well managed on SSI and frequent CBG monitoring CBG (last 3)  Recent Labs    12/20/21 2119 12/21/21 0307 12/21/21 0718  GLUCAP 196* 174* 175*

## 2021-12-18 NOTE — Progress Notes (Signed)
Gastroenterology Progress Note   Referring Provider: No ref. provider found Primary Care Physician:  [name], MD Primary Gastroenterologist:  Dr. [name]  Patient ID: Chelsea [name]; [id number]; February 18, 1956    Subjective   Denies abdominal pain, nausea, vomiting. Had a bowel movement during the exam, flushed before I could see it. Reported it as small and somewhat dark. No BRBPR. Reports feeling better. Currently receiving a unit of blood. Up walking around the unit without shortness of breath or chest pain. No increase in abdominal girth. Reports being tired from not much sleep. States she is hungry and would like to eat.    Objective   Vital signs in last 24 hours Temp:  [97.7 F (36.5 C)-98.6 F (37 C)] 98.1 F (36.7 C) (05/26 0835) Pulse Rate:  [74-91] 82 (05/26 0835) Resp:  [15-24] 20 (05/26 0835) BP: (117-166)/(51-80) 161/65 (05/26 0816) SpO2:  [96 %-100 %] 100 % (05/26 0835) Last BM Date : 12/16/21 (occult positive)  Physical Exam General:   Alert and oriented, pleasant Head:  Normocephalic and atraumatic. Eyes:  No icterus, sclera clear. Conjuctiva pink.  Mouth:  Without lesions, mucosa pink and moist.  Neck:  Supple, without thyromegaly or masses.  Heart:  S1, S2 present, no murmurs noted.  Lungs: Clear to auscultation bilaterally, without wheezing, rales, or rhonchi.  Abdomen:  Bowel sounds present, soft, non-tender, non-distended. No HSM or hernias noted. No rebound or guarding. No masses appreciated  Msk:  Symmetrical without gross deformities. Normal posture. Extremities:  Without clubbing or edema. Neurologic:  Alert and  oriented x4;  grossly normal neurologically. Skin:  Warm and dry, intact without significant lesions.  Psych:  Alert and cooperative. Normal mood and affect.  Intake/Output from previous day: 05/25 0701 - 05/26 0700 In: 1274 [I.V.:859; Blood:315; IV Piggyback:100] Out: -  Intake/Output this shift: Total I/O In: 527.6  [I.V.:205.6; Blood:322] Out: -   Lab Results  Recent Labs    12/16/21 1517 12/17/21 0914 12/18/21 0414  WBC 4.6 5.3 4.9  HGB 4.0* 7.1* 6.9*  HCT 14.9* 23.0* 23.0*  PLT 83* 74* 69*   BMET Recent Labs    12/16/21 1517 12/17/21 0914 12/18/21 0414  NA 135 138 139  K 3.2* 3.8 3.8  CL 106 110 112*  CO2 '24 25 23  ' GLUCOSE 231* 136* 182*  BUN '15 14 15  ' CREATININE 1.07* 1.15* 1.13*  CALCIUM 9.2 8.9 8.7*   LFT Recent Labs    12/17/21 0914 12/18/21 0414  PROT 6.9 6.7  ALBUMIN 2.5* 2.5*  AST 54* 48*  ALT 23 21  ALKPHOS 76 71  BILITOT 2.7* 1.9*   PT/INR Recent Labs    12/17/21 0914 12/18/21 0414  LABPROT 23.2* 21.6*  INR 2.1* 1.9*   Hepatitis Panel No results for input(s): HEPBSAG, HCVAB, HEPAIGM, HEPBIGM in the last 72 hours.  Studies/Results No results found.  Assessment  66 y.o. female with a history of A-fib on Coumadin, CHF, COPD, HLD, stroke in 2011, type 2 diabetes, HTN, IDA requiring transfusions, likely NASH cirrhosis who presented to the ED at the recommendation of her PCP reported she had a very low hemoglobin.  GI consulted for hemoglobin of 4, heme positive stool, and upper GI bleed with cirrhosis.  Cirrhosis: Likely secondary to [name], prior work-up negative for viral hepatitis, does have history of elevated IgG.  Denies alcohol use.  Last AFP in October 2021 was 2.2, currently < 1.8  ANA negative, AMA, ASMA remain pending.  Per chart  review patient has poor follow-up with completing labs after follow-up appointments.  Patient was seen by Dr. [name] on 11/30/2021, he ordered updated labs and right upper quadrant ultrasound to be completed in the near future.  Last RUQ ultrasound from November 2022 with cirrhosis without focal lesions, trace hepatic ascites.  INR supratherapeutic at 5.6 on admission secondary to Coumadin use, reported that she been taking 6 mg of Coumadin daily except on Saturdays when she took 9 mg.  She was given 2 units FFP.  Labs today  with sodium 138, creatinine 1.13, albumin 2.5, Plts 69 (74), AST 48 (54), ALT and alk phos WNL, T. bili improved to 1.9.  She has had further improvement to her INR which is 1.9 today.  She was started on Rocephin 1 g daily for SBP prophylaxis given esophageal variceal bleeding.  Patient has had complaints of increased bilateral lower extremity swelling along with some additional abdominal swelling.  She should resume her home Lasix and add spironolactone 25 mg daily.  MELD Na: 18 today, improved.   Esophageal variceal bleeding/symptomatic anemia: Patient presented with complaints of generalized fatigue, shortness of breath exertion, and transient lightheadedness for 2 weeks.  She is also having dark sticky stools during this timeframe as well.  She denies alcohol or chronic NSAID use.  She was advised to present to the ED for blood transfusion due to low hemoglobin at her PCP office.  Hgb on admission was 4.  She was given 3 units PRBCs and 2 units FFP.  Follow-up hemoglobin was 7.1.  Per ED note stool Hemoccult was strongly positive.  She has denied any overt melena or BRBPR.  She was started on octreotide and PPI infusion.  She has known history of esophageal varices s/p banding in June 2022.  She underwent repeat EGD yesterday 5/25 with grade 3 esophageal varices with stigmata of bleeding s/p banding, portal hypertensive gastropathy, normal duodenum.  Follow-up Hgb 6.9 today, she will receive 1 unit PRBC.  Given esophageal varices goal hemoglobin should be 7-8, transfuse conservatively.  She will need follow-up EGD in 1 to 2 weeks. Per chart review it is unclear who is prescribing or managing her Coumadin. She should remain off of Coumadin until after next EGD.  Continue octreotide for total of 72 hours.  Continue oral PPI.  Avoid all NSAIDs.  After follow up EGD there should be consideration to transition from calcium channel blocker to non selective beta blocker for variceal prophylaxis.     Plan /  Recommendations  Advance to soft diet. Repeat EGD in 1 to 2 weeks Daily CBC, CMP, PT/INR Transfuse conservatively, goal hemoglobin 7-8 given varices Continue to hold Coumadin until after follow-up EGD Avoid NSAIDs Continue octreotide infusion for 72 hours PPI daily Continue Rocephin 1 g every 24 hours for 5 days for SBP prophylaxis Follow-up AMA/ASMA.  Resume home Lasix, began spironolactone 25 mg daily Consider transition to nonselective beta-blocker from diltiazem after next EGD for further variceal prophylaxis.    LOS: 2 days    12/18/2021, 8:36 AM   Venetia Night, MSN, FNP-BC, AGACNP-BC [facility]

## 2021-12-18 NOTE — Assessment & Plan Note (Signed)
--   suboptimally controlled on diltiazem CD 120 mg, Increased dose to 180 mg daily.    -- GI planning to add nonselective beta blocker after next banding session

## 2021-12-19 DIAGNOSIS — D62 Acute posthemorrhagic anemia: Secondary | ICD-10-CM | POA: Diagnosis not present

## 2021-12-19 LAB — COMPREHENSIVE METABOLIC PANEL
ALT: 21 U/L (ref 0–44)
AST: 38 U/L (ref 15–41)
Albumin: 2.7 g/dL — ABNORMAL LOW (ref 3.5–5.0)
Alkaline Phosphatase: 78 U/L (ref 38–126)
Anion gap: 4 — ABNORMAL LOW (ref 5–15)
BUN: 13 mg/dL (ref 8–23)
CO2: 24 mmol/L (ref 22–32)
Calcium: 8.8 mg/dL — ABNORMAL LOW (ref 8.9–10.3)
Chloride: 111 mmol/L (ref 98–111)
Creatinine, Ser: 1.11 mg/dL — ABNORMAL HIGH (ref 0.44–1.00)
GFR, Estimated: 56 mL/min — ABNORMAL LOW (ref >60)
Glucose, Bld: 200 mg/dL — ABNORMAL HIGH (ref 70–99)
Potassium: 3.4 mmol/L — ABNORMAL LOW (ref 3.5–5.1)
Sodium: 139 mmol/L (ref 135–145)
Total Bilirubin: 2.1 mg/dL — ABNORMAL HIGH (ref 0.3–1.2)
Total Protein: 7.4 g/dL (ref 6.5–8.1)

## 2021-12-19 LAB — GLUCOSE, CAPILLARY
Glucose-Capillary: 205 mg/dL — ABNORMAL HIGH (ref 70–99)
Glucose-Capillary: 189 mg/dL — ABNORMAL HIGH (ref 70–99)
Glucose-Capillary: 225 mg/dL — ABNORMAL HIGH (ref 70–99)
Glucose-Capillary: 283 mg/dL — ABNORMAL HIGH (ref 70–99)
Glucose-Capillary: 274 mg/dL — ABNORMAL HIGH (ref 70–99)

## 2021-12-19 LAB — PROTIME-INR
INR: 1.9 — ABNORMAL HIGH (ref 0.8–1.2)
Prothrombin Time: 21.8 seconds — ABNORMAL HIGH (ref 11.4–15.2)

## 2021-12-19 LAB — CBC
HCT: 30.7 % — ABNORMAL LOW (ref 36.0–46.0)
Hemoglobin: 9.4 g/dL — ABNORMAL LOW (ref 12.0–15.0)
MCH: 24 pg — ABNORMAL LOW (ref 26.0–34.0)
MCHC: 30.6 g/dL (ref 30.0–36.0)
MCV: 78.3 fL — ABNORMAL LOW (ref 80.0–100.0)
Platelets: 76 10*3/uL — ABNORMAL LOW (ref 150–400)
RBC: 3.92 MIL/uL (ref 3.87–5.11)
RDW: 21.2 % — ABNORMAL HIGH (ref 11.5–15.5)
WBC: 7 10*3/uL (ref 4.0–10.5)
nRBC: 0.3 % — ABNORMAL HIGH (ref 0.0–0.2)

## 2021-12-19 NOTE — Assessment & Plan Note (Signed)
--   INR now at 2.0.  -- warfarin on hold until after next EGD

## 2021-12-19 NOTE — Progress Notes (Signed)
PROGRESS NOTE   Chelsea [name]  [id number] DOB: 10-15-1955 DOA: 12/16/2021 PCP: Chelsea Bis, MD   Chief Complaint  Patient presents with   Abnormal Lab   Level of care: Telemetry  Brief Admission History:  66 y.o. female, with past medical history significant for Chelsea [name] liver cirrhosis, hypertension, type 2 diabetes mellitus, atrial fibrillation on warfarin, CHF, hyperlipidemia, COPD, iron deficiency anemia requiring transfusions, vaginal varices status post banding, patient was sent by her PCP for low hemoglobin level, patient had a visit with her PCP yesterday, where she was called this morning she was asked to come to ED for transfusion given very low hemoglobin, she reports she required transfusion last November, she does report generalized weakness, fatigue, out of energy, short of breath with exertion, and some lightheadedness as well, she denies any chest pain, focal deficits, she denies any nausea, vomiting or coffee-ground emesis, she does report dark-colored stools over , but no bright red blood per rectum, she denies any NSAIDs use.  In ED labs were significant for hemoglobin of 4, and INR of 5.6, potassium low at 3.2, glucose elevated at 231, her platelet were low at 53k, her EKG showing A-fib with RVR heart rate of 123, [facility] hospitalist consulted to admit.   Assessment and Plan: * Acute blood loss anemia -- presumed upper GI bleed in setting of supratherapeutic PT/INR  -- continue octreotide and protonix  -- EGD later today.  -- s/p 3 units PRBC with Hg up to 7.1.  2 additional units PRBC given 5/26, Hg up to 9. -- s/p vitamin K and 2 units FFP  Supratherapeutic INR - after treatment INR down to 1.9 - warfarin on hold until after next EGD  Esophageal varices (HCC) -- Remains on IV octreotide infusion x 72 hours -- GI following EGD with Dr. Gala [name]: she had grade 3 esophageal varices with bleeding stigmata s/p banding and diffuse portal hypertensive gastropathy -- she  is on ceftriaxone for SBP prophylaxis x 5 days and once daily protonix   Liver cirrhosis secondary to NASH [facility]) -- appreciate GI consult and recommendations -- resumed oral lasix and spironolactone -- pt is being referred for liver transplant   Sleep apnea -- will offer CPAP therapy   Chronic anticoagulation -- temporarily holding warfarin in setting of GI bleeding and severe anemia  -- s/p 2 units ffp and vitamin K -- GI requested holding warfarin until after next EGD in 1-2 weeks  Atrial fibrillation, chronic (HCC) -- HR much better controlled now on oral diltiazem.  -- GI requested to hold warfarin until repeat EGD in 1-2 weeks  Essential hypertension, benign -- suboptimally controlled on diltiazem CD 120 mg  -- added bisoprolol 2.5 mg daily   Diabetes mellitus ([place]) -- well managed on SSI and frequent CBG monitoring CBG (last 3)  Recent Labs    12/19/21 0313 12/19/21 0724 12/19/21 1118  GLUCAP 205* 189* 225*     DVT prophylaxis: SCDs Code Status: DNR  Family Communication:  Disposition: Status is: Inpatient Remains inpatient appropriate because: IV infusions required    Consultants:  GI  Procedures:  EGD pending  Antimicrobials:  Ceftriaxone 5/25>>  Subjective: Pt reports that overall she is feeling much better.   Objective: Vitals:   12/18/21 2012 12/18/21 2351 12/19/21 0427 12/19/21 1349  BP: (!) 150/83 139/75 (!) 168/83 136/75  Pulse: 70 75 75 67  Resp: 20 17 (!) 22 19  Temp: 98.6 F (37 C) 98.1 F (36.7 C) 98.1 F (36.7  C) 98.3 F (36.8 C)  TempSrc:  Oral Axillary Oral  SpO2: 98% 94% 100% 98%  Weight:      Height:        Intake/Output Summary (Last 24 hours) at 12/19/2021 1439 Last data filed at 12/19/2021 1300 Gross per 24 hour  Intake 1281.46 ml  Output 350 ml  Net 931.46 ml   Filed Weights   12/16/21 1327 12/16/21 1800 12/16/21 1815  Weight: 104.3 kg 101.2 kg 101.4 kg   Examination:  General exam: Appears calm and comfortable   Respiratory system: Clear to auscultation. Respiratory effort normal. Cardiovascular system: normal S1 & S2 heard. No JVD, murmurs, rubs, gallops or clicks. No pedal edema. Gastrointestinal system: Abdomen is nondistended, soft and nontender. No organomegaly or masses felt. Normal bowel sounds heard. Central nervous system: Alert and oriented. No focal neurological deficits. Extremities: Symmetric 5 x 5 power. Skin: No rashes, lesions or ulcers. Psychiatry: Judgement and insight appear normal. Mood & affect appropriate.   Data Reviewed: I have personally reviewed following labs and imaging studies  CBC: Recent Labs  Lab 12/16/21 1517 12/17/21 0914 12/18/21 0414 12/18/21 1313 12/19/21 0423  WBC 4.6 5.3 4.9  --  7.0  HGB 4.0* 7.1* 6.9* 9.6* 9.4*  HCT 14.9* 23.0* 23.0* 30.9* 30.7*  MCV 68.3* 74.9* 76.2*  --  78.3*  PLT 83* 74* 69*  --  76*    Basic Metabolic Panel: Recent Labs  Lab 12/16/21 1517 12/17/21 0914 12/18/21 0414 12/19/21 0423  NA 135 138 139 139  K 3.2* 3.8 3.8 3.4*  CL 106 110 112* 111  CO2 24 25 23 24   GLUCOSE 231* 136* 182* 200*  BUN 15 14 15 13   CREATININE 1.07* 1.15* 1.13* 1.11*  CALCIUM 9.2 8.9 8.7* 8.8*  MG 1.6*  --  1.9  --     CBG: Recent Labs  Lab 12/18/21 1659 12/18/21 2032 12/19/21 0313 12/19/21 0724 12/19/21 1118  GLUCAP 198* 265* 205* 189* 225*    Recent Results (from the past 240 hour(s))  MRSA Next Gen by PCR, Nasal     Status: None   Collection Time: 12/16/21  6:03 PM   Specimen: Nasal Mucosa; Nasal Swab  Result Value Ref Range Status   MRSA by PCR Next Gen NOT DETECTED NOT DETECTED Final    Comment: (NOTE) The GeneXpert MRSA Assay (FDA approved for NASAL specimens only), is one component of a comprehensive MRSA colonization surveillance program. It is not intended to diagnose MRSA infection nor to guide or monitor treatment for MRSA infections. Test performance is not FDA approved in patients less than 23  years old. Performed at [facility], [address] [place], [place] [address]      Radiology Studies: No results found.  Scheduled Meds:  bisoprolol  2.5 mg Oral Daily   diltiazem  120 mg Oral Daily   insulin aspart  0-9 Units Subcutaneous TID WC   pantoprazole  40 mg Oral Daily   vitamin B-12  1,000 mcg Oral Once per day on Mon Wed Fri   Continuous Infusions:  cefTRIAXone (ROCEPHIN)  IV 1 g (12/19/21 1156)   octreotide  (SANDOSTATIN)    IV infusion 50 mcg/hr (12/19/21 0810)     LOS: 3 days   Time spent: 67 mins   Suzanna Zahn Wynetta Emery, MD How to contact the [facility] Attending or Consulting provider [place] or covering provider during after hours [place], for this patient?  Check the care team in [facility] and look  for a) attending/consulting [place] provider listed and b) the [facility] team listed Log into [email or web address] and use [place]'s universal password to access. If you do not have the password, please contact the hospital operator. Locate the [facility] provider you are looking for under [facility] and page to a number that you can be directly reached. If you still have difficulty reaching the provider, please page the [facility] (Director on Call) for the Hospitalists listed on amion for assistance.  12/19/2021, 2:39 PM

## 2021-12-20 DIAGNOSIS — D62 Acute posthemorrhagic anemia: Secondary | ICD-10-CM | POA: Diagnosis not present

## 2021-12-20 DIAGNOSIS — I4891 Unspecified atrial fibrillation: Secondary | ICD-10-CM

## 2021-12-20 LAB — TYPE AND SCREEN
ABO/RH(D): O POS
Antibody Screen: NEGATIVE
Unit division: 0
Unit division: 0
Unit division: 0
Unit division: 0
Unit division: 0
Unit division: 0

## 2021-12-20 LAB — BPAM RBC
Blood Product Expiration Date: 202306262359
Blood Product Expiration Date: 202306262359
Blood Product Expiration Date: 202306262359
Blood Product Expiration Date: 202306302359
Blood Product Expiration Date: 202306302359
Blood Product Expiration Date: 202307012359
ISSUE DATE / TIME: 202305241719
ISSUE DATE / TIME: 202305241831
ISSUE DATE / TIME: 202305250037
ISSUE DATE / TIME: 202305260523
ISSUE DATE / TIME: 202305260813
Unit Type and Rh: 5100
Unit Type and Rh: 5100
Unit Type and Rh: 5100
Unit Type and Rh: 5100
Unit Type and Rh: 5100
Unit Type and Rh: 5100

## 2021-12-20 LAB — GLUCOSE, CAPILLARY
Glucose-Capillary: 173 mg/dL — ABNORMAL HIGH (ref 70–99)
Glucose-Capillary: 166 mg/dL — ABNORMAL HIGH (ref 70–99)
Glucose-Capillary: 254 mg/dL — ABNORMAL HIGH (ref 70–99)
Glucose-Capillary: 239 mg/dL — ABNORMAL HIGH (ref 70–99)
Glucose-Capillary: 196 mg/dL — ABNORMAL HIGH (ref 70–99)

## 2021-12-20 LAB — COMPREHENSIVE METABOLIC PANEL
ALT: 19 U/L (ref 0–44)
AST: 28 U/L (ref 15–41)
Albumin: 2.4 g/dL — ABNORMAL LOW (ref 3.5–5.0)
Alkaline Phosphatase: 70 U/L (ref 38–126)
Anion gap: 3 — ABNORMAL LOW (ref 5–15)
BUN: 13 mg/dL (ref 8–23)
CO2: 22 mmol/L (ref 22–32)
Calcium: 8.8 mg/dL — ABNORMAL LOW (ref 8.9–10.3)
Chloride: 113 mmol/L — ABNORMAL HIGH (ref 98–111)
Creatinine, Ser: 1 mg/dL (ref 0.44–1.00)
GFR, Estimated: >60 mL/min (ref >60)
Glucose, Bld: 182 mg/dL — ABNORMAL HIGH (ref 70–99)
Potassium: 3.6 mmol/L (ref 3.5–5.1)
Sodium: 138 mmol/L (ref 135–145)
Total Bilirubin: 1.5 mg/dL — ABNORMAL HIGH (ref 0.3–1.2)
Total Protein: 7 g/dL (ref 6.5–8.1)

## 2021-12-20 LAB — CBC
HCT: 29.9 % — ABNORMAL LOW (ref 36.0–46.0)
Hemoglobin: 9.1 g/dL — ABNORMAL LOW (ref 12.0–15.0)
MCH: 23.9 pg — ABNORMAL LOW (ref 26.0–34.0)
MCHC: 30.4 g/dL (ref 30.0–36.0)
MCV: 78.5 fL — ABNORMAL LOW (ref 80.0–100.0)
Platelets: 62 10*3/uL — ABNORMAL LOW (ref 150–400)
RBC: 3.81 MIL/uL — ABNORMAL LOW (ref 3.87–5.11)
RDW: 21.9 % — ABNORMAL HIGH (ref 11.5–15.5)
WBC: 6.3 10*3/uL (ref 4.0–10.5)
nRBC: 0 % (ref 0.0–0.2)

## 2021-12-20 LAB — PROTIME-INR
INR: 2 — ABNORMAL HIGH (ref 0.8–1.2)
Prothrombin Time: 22.3 seconds — ABNORMAL HIGH (ref 11.4–15.2)

## 2021-12-20 NOTE — Progress Notes (Signed)
PROGRESS NOTE   Chelsea [name]  [id number] DOB: 01-01-1956 DOA: 12/16/2021 PCP: [name], MD   Chief Complaint  Patient presents with   Abnormal Lab   Level of care: Telemetry  Brief Admission History:  66 y.o. female, with past medical history significant for [name] liver cirrhosis, hypertension, type 2 diabetes mellitus, atrial fibrillation on warfarin, CHF, hyperlipidemia, COPD, iron deficiency anemia requiring transfusions, vaginal varices status post banding, patient was sent by her PCP for low hemoglobin level, patient had a visit with her PCP yesterday, where she was called this morning she was asked to come to ED for transfusion given very low hemoglobin, she reports she required transfusion last November, she does report generalized weakness, fatigue, out of energy, short of breath with exertion, and some lightheadedness as well, she denies any chest pain, focal deficits, she denies any nausea, vomiting or coffee-ground emesis, she does report dark-colored stools over , but no bright red blood per rectum, she denies any NSAIDs use.  In ED labs were significant for hemoglobin of 4, and INR of 5.6, potassium low at 3.2, glucose elevated at 231, her platelet were low at 53k, her EKG showing A-fib with RVR heart rate of 123, [facility] hospitalist consulted to admit.   Assessment and Plan: * Acute blood loss anemia -- presumed upper GI bleed in setting of supratherapeutic PT/INR  -- continue octreotide and protonix  -- EGD later today.  -- s/p 3 units PRBC with Hg up to 7.1.  2 additional units PRBC given 5/26, Hg up to 9. -- s/p vitamin K and 2 units FFP  Supratherapeutic INR - after treatment INR down to 1.9 - warfarin on hold until after next EGD  Esophageal varices (HCC) -- Remains on IV octreotide infusion x 72 hours -- GI following EGD with Dr. Gala Romney: she had grade 3 esophageal varices with bleeding stigmata s/p banding and diffuse portal hypertensive gastropathy -- she  is on ceftriaxone for SBP prophylaxis x 5 days and once daily protonix   Liver cirrhosis secondary to NASH [facility]) -- appreciate GI consult and recommendations -- resumed oral lasix and spironolactone -- pt is being referred for liver transplant   Sleep apnea -- will offer CPAP therapy   Chronic anticoagulation -- temporarily holding warfarin in setting of GI bleeding and severe anemia  -- s/p 2 units ffp and vitamin K -- GI requested holding warfarin until after next EGD in 1-2 weeks  Atrial fibrillation, chronic (HCC) -- HR much better controlled now on oral diltiazem.  -- GI requested to hold warfarin until repeat EGD in 1-2 weeks  Essential hypertension, benign -- suboptimally controlled on diltiazem CD 120 mg  -- GI planning to add nonselective beta blocker after next banding session  Diabetes mellitus ([place]) -- well managed on SSI and frequent CBG monitoring CBG (last 3)  Recent Labs    12/20/21 0319 12/20/21 0725 12/20/21 1107  GLUCAP 173* 166* 254*     DVT prophylaxis: SCDs Code Status: DNR  Family Communication:  Disposition: Status is: Inpatient Remains inpatient appropriate because: IV infusions required    Consultants:  GI  Procedures:  EGD pending  Antimicrobials:  Ceftriaxone 5/25>>  Subjective: Pt without complaints today.    Objective: Vitals:   12/19/21 1349 12/19/21 2208 12/20/21 0600 12/20/21 1408  BP: 136/75 (!) 156/74 (!) 157/80 (!) 159/76  Pulse: 67 76 70 72  Resp: 19 (!) 22 20 19   Temp: 98.3 F (36.8 C) 98.1 F (36.7 C) 98.1  F (36.7 C) 98.2 F (36.8 C)  TempSrc: Oral Oral Oral Oral  SpO2: 98% 99% 97% 100%  Weight:      Height:        Intake/Output Summary (Last 24 hours) at 12/20/2021 1540 Last data filed at 12/20/2021 1300 Gross per 24 hour  Intake 480 ml  Output --  Net 480 ml   Filed Weights   12/16/21 1327 12/16/21 1800 12/16/21 1815  Weight: 104.3 kg 101.2 kg 101.4 kg   Examination:  General exam: Appears calm and  comfortable  Respiratory system: Clear to auscultation. Respiratory effort normal. Cardiovascular system: normal S1 & S2 heard. No JVD, murmurs, rubs, gallops or clicks. No pedal edema. Gastrointestinal system: Abdomen is nondistended, soft and nontender. No organomegaly or masses felt. Normal bowel sounds heard. Central nervous system: Alert and oriented. No focal neurological deficits. Extremities: Symmetric 5 x 5 power. Skin: No rashes, lesions or ulcers. Psychiatry: Judgement and insight appear normal. Mood & affect appropriate.   Data Reviewed: I have personally reviewed following labs and imaging studies  CBC: Recent Labs  Lab 12/16/21 1517 12/17/21 0914 12/18/21 0414 12/18/21 1313 12/19/21 0423 12/20/21 0354  WBC 4.6 5.3 4.9  --  7.0 6.3  HGB 4.0* 7.1* 6.9* 9.6* 9.4* 9.1*  HCT 14.9* 23.0* 23.0* 30.9* 30.7* 29.9*  MCV 68.3* 74.9* 76.2*  --  78.3* 78.5*  PLT 83* 74* 69*  --  76* 62*    Basic Metabolic Panel: Recent Labs  Lab 12/16/21 1517 12/17/21 0914 12/18/21 0414 12/19/21 0423 12/20/21 0354  NA 135 138 139 139 138  K 3.2* 3.8 3.8 3.4* 3.6  CL 106 110 112* 111 113*  CO2 24 25 23 24 22   GLUCOSE 231* 136* 182* 200* 182*  BUN 15 14 15 13 13   CREATININE 1.07* 1.15* 1.13* 1.11* 1.00  CALCIUM 9.2 8.9 8.7* 8.8* 8.8*  MG 1.6*  --  1.9  --   --     CBG: Recent Labs  Lab 12/19/21 1613 12/19/21 2151 12/20/21 0319 12/20/21 0725 12/20/21 1107  GLUCAP 283* 274* 173* 166* 254*    Recent Results (from the past 240 hour(s))  MRSA Next Gen by PCR, Nasal     Status: None   Collection Time: 12/16/21  6:03 PM   Specimen: Nasal Mucosa; Nasal Swab  Result Value Ref Range Status   MRSA by PCR Next Gen NOT DETECTED NOT DETECTED Final    Comment: (NOTE) The GeneXpert MRSA Assay (FDA approved for NASAL specimens only), is one component of a comprehensive MRSA colonization surveillance program. It is not intended to diagnose MRSA infection nor to guide or monitor  treatment for MRSA infections. Test performance is not FDA approved in patients less than 60 years old. Performed at [facility], [address], [place], [place] [address]      Radiology Studies: No results found.  Scheduled Meds:  diltiazem  120 mg Oral Daily   insulin aspart  0-9 Units Subcutaneous TID WC   pantoprazole  40 mg Oral Daily   vitamin B-12  1,000 mcg Oral Once per day on Mon Wed Fri   Continuous Infusions:  cefTRIAXone (ROCEPHIN)  IV 1 g (12/20/21 1209)    LOS: 4 days   Time spent: 40 mins   Merdith Adan Wynetta Emery, MD How to contact the [facility] Attending or Consulting provider [place] or covering provider during after hours [place], for this patient?  Check the care team in [facility] and look for a)  attending/consulting [facility] provider listed and b) the [facility] team listed Log into [email or web address] and use [place]'s universal password to access. If you do not have the password, please contact the hospital operator. Locate the [facility] provider you are looking for under [facility] and page to a number that you can be directly reached. If you still have difficulty reaching the provider, please page the [facility] (Director on Call) for the Hospitalists listed on amion for assistance.  12/20/2021, 3:40 PM

## 2021-12-20 NOTE — Progress Notes (Signed)
  Patient without complaints.  Tolerating more or less her regular diet.  No chest pain melena or hematochezia.  Hemoglobin 9.1 this morning  Vital signs in last 24 hours: Temp:  [98.1 F (36.7 C)-98.3 F (36.8 C)] 98.1 F (36.7 C) (05/28 0600) Pulse Rate:  [67-76] 70 (05/28 0600) Resp:  [19-22] 20 (05/28 0600) BP: (136-157)/(74-80) 157/80 (05/28 0600) SpO2:  [97 %-99 %] 97 % (05/28 0600) Last BM Date : 12/19/21 General:   Alert,  , pleasant and cooperative in NAD Abdomen:  Soft, nontender and nondistended.  Normal bowel sounds, without guarding, and without rebound.  No mass or organomegaly. Extremities:  Without clubbing or edema.    Intake/Output from previous day: 05/27 0701 - 05/28 0700 In: 480 [P.O.:480] Out: -  Intake/Output this shift: No intake/output data recorded.  Lab Results: Recent Labs    12/18/21 0414 12/18/21 1313 12/19/21 0423 12/20/21 0354  WBC 4.9  --  7.0 6.3  HGB 6.9* 9.6* 9.4* 9.1*  HCT 23.0* 30.9* 30.7* 29.9*  PLT 69*  --  76* 62*   BMET Recent Labs    12/18/21 0414 12/19/21 0423 12/20/21 0354  NA 139 139 138  K 3.8 3.4* 3.6  CL 112* 111 113*  CO2 23 24 22   GLUCOSE 182* 200* 182*  BUN 15 13 13   CREATININE 1.13* 1.11* 1.00  CALCIUM 8.7* 8.8* 8.8*   LFT Recent Labs    12/20/21 0354  PROT 7.0  ALBUMIN 2.4*  AST 28  ALT 19  ALKPHOS 70  BILITOT 1.5*   PT/INR Recent Labs    12/19/21 0423 12/20/21 0354  LABPROT 21.8* 22.3*  INR 1.9* 2.0*    Impression: [place]/cirrhotic with portal hypertension admitted with fatigue secondary to anemia from upper GI bleed-esophageal varices found with bleeding stigmata on recent EGD.  Status post esophageal band ligation Bleeding has ceased.  She is doing well at this time. ANA, AMA and anti-smooth muscle antibody came back negative.  Recommendations:  Wrap-up 72 hours of octreotide  Complete 5-day course of IV Rocephin  I recommend repeat EGD end of this week (could be done as an  outpatient) or no later than first of next week for repeat banding as needed.  Would recommend a nonselective beta-blocker moving forward after next banding session.  Would favor holding anticoagulation till next banding session completed.

## 2021-12-21 DIAGNOSIS — E119 Type 2 diabetes mellitus without complications: Secondary | ICD-10-CM | POA: Diagnosis not present

## 2021-12-21 DIAGNOSIS — D62 Acute posthemorrhagic anemia: Secondary | ICD-10-CM | POA: Diagnosis not present

## 2021-12-21 DIAGNOSIS — I482 Chronic atrial fibrillation, unspecified: Secondary | ICD-10-CM | POA: Diagnosis not present

## 2021-12-21 DIAGNOSIS — Z7901 Long term (current) use of anticoagulants: Secondary | ICD-10-CM | POA: Diagnosis not present

## 2021-12-21 LAB — CBC
HCT: 30 % — ABNORMAL LOW (ref 36.0–46.0)
Hemoglobin: 9.3 g/dL — ABNORMAL LOW (ref 12.0–15.0)
MCH: 24.8 pg — ABNORMAL LOW (ref 26.0–34.0)
MCHC: 31 g/dL (ref 30.0–36.0)
MCV: 80 fL (ref 80.0–100.0)
Platelets: 70 10*3/uL — ABNORMAL LOW (ref 150–400)
RBC: 3.75 MIL/uL — ABNORMAL LOW (ref 3.87–5.11)
RDW: 23.1 % — ABNORMAL HIGH (ref 11.5–15.5)
WBC: 5.7 10*3/uL (ref 4.0–10.5)
nRBC: 0 % (ref 0.0–0.2)

## 2021-12-21 LAB — GLUCOSE, CAPILLARY
Glucose-Capillary: 174 mg/dL — ABNORMAL HIGH (ref 70–99)
Glucose-Capillary: 175 mg/dL — ABNORMAL HIGH (ref 70–99)
Glucose-Capillary: 221 mg/dL — ABNORMAL HIGH (ref 70–99)

## 2021-12-21 LAB — COMPREHENSIVE METABOLIC PANEL
ALT: 17 U/L (ref 0–44)
AST: 27 U/L (ref 15–41)
Albumin: 2.6 g/dL — ABNORMAL LOW (ref 3.5–5.0)
Alkaline Phosphatase: 74 U/L (ref 38–126)
Anion gap: 2 — ABNORMAL LOW (ref 5–15)
BUN: 15 mg/dL (ref 8–23)
CO2: 23 mmol/L (ref 22–32)
Calcium: 9.1 mg/dL (ref 8.9–10.3)
Chloride: 112 mmol/L — ABNORMAL HIGH (ref 98–111)
Creatinine, Ser: 1.03 mg/dL — ABNORMAL HIGH (ref 0.44–1.00)
GFR, Estimated: >60 mL/min (ref >60)
Glucose, Bld: 180 mg/dL — ABNORMAL HIGH (ref 70–99)
Potassium: 3.6 mmol/L (ref 3.5–5.1)
Sodium: 137 mmol/L (ref 135–145)
Total Bilirubin: 1.9 mg/dL — ABNORMAL HIGH (ref 0.3–1.2)
Total Protein: 7.5 g/dL (ref 6.5–8.1)

## 2021-12-21 LAB — PROTIME-INR
INR: 2 — ABNORMAL HIGH (ref 0.8–1.2)
Prothrombin Time: 22.3 seconds — ABNORMAL HIGH (ref 11.4–15.2)

## 2021-12-21 MED ORDER — PANTOPRAZOLE SODIUM 40 MG PO TBEC
40.0000 mg | DELAYED_RELEASE_TABLET | Freq: Every day | ORAL | 2 refills | Status: AC
Start: 2021-12-22 — End: ?

## 2021-12-21 MED ORDER — DILTIAZEM HCL ER COATED BEADS 180 MG PO CP24
180.0000 mg | ORAL_CAPSULE | Freq: Every day | ORAL | 2 refills | Status: AC
Start: 2021-12-21 — End: ?

## 2021-12-21 NOTE — Progress Notes (Signed)
Nsg Discharge Note  Admit Date:  12/16/2021 Discharge date: 12/21/2021   Chelsea [name] to be D/C'd Home per MD order.  AVS completed.  Copy for chart, and copy for patient signed, and dated. Patient/caregiver able to verbalize understanding.  Discharge Medication: Allergies as of 12/21/2021       Reactions   Bee Venom Anaphylaxis   Lisinopril Cough        Medication List     STOP taking these medications    diltiazem 60 MG tablet Commonly known as: CARDIZEM   warfarin 6 MG tablet Commonly known as: COUMADIN       TAKE these medications    diltiazem 180 MG 24 hr capsule Commonly known as: CARDIZEM CD Take 1 capsule (180 mg total) by mouth daily. What changed:  medication strength how much to take   furosemide 40 MG tablet Commonly known as: LASIX TAKE 1 TABLET BY MOUTH EVERY DAY AS NEEDED What changed: when to take this   metFORMIN 1000 MG tablet Commonly known as: GLUCOPHAGE Take 1,000 mg by mouth 2 (two) times daily with a meal.   pantoprazole 40 MG tablet Commonly known as: PROTONIX Take 1 tablet (40 mg total) by mouth daily. Start taking on: Dec 22, 2021   vitamin B-12 1000 MCG tablet Commonly known as: CYANOCOBALAMIN Take 1,000 mcg by mouth 3 (three) times a week.   Vitamin D (Ergocalciferol) 1.25 MG (50000 UNIT) Caps capsule Commonly known as: DRISDOL Take 50,000 Units by mouth every Monday.        Discharge Assessment: Vitals:   12/20/21 2116 12/21/21 0543  BP: (!) 149/77 131/68  Pulse: 67 68  Resp: 20 19  Temp: 98.6 F (37 C) 98.4 F (36.9 C)  SpO2: 100% 100%   Skin clean, dry and intact without evidence of skin break down, no evidence of skin tears noted. IV catheter discontinued intact. Site without signs and symptoms of complications - no redness or edema noted at insertion site, patient denies c/o pain - only slight tenderness at site.  Dressing with slight pressure applied.  D/c Instructions-Education: Discharge  instructions given to patient/family with verbalized understanding. D/c education completed with patient/family including follow up instructions, medication list, d/c activities limitations if indicated, with other d/c instructions as indicated by MD - patient able to verbalize understanding, all questions fully answered. Patient instructed to return to ED, call 911, or call MD for any changes in condition.  Patient escorted via [place], and D/C home via private auto.  Dorcas Mcmurray, LPN [id number] [phone] PM

## 2021-12-21 NOTE — Discharge Instructions (Addendum)
PLEASE FOLLOW UP WITH DR. Levon Hedger TO ARRANGE FOR REPEAT EGD IN 1-2 WEEKS  DR CASTANEDA'S OFFICE SHOULD BE CALLING YOU ABOUT ARRANGING YOUR FOLLOW UP EGD  PLEASE DO NOT TAKE WARFARIN UNTIL AFTER YOU HAVE HAD YOUR FOLLOW UP EGD IN 1-2 WEEKS AND OK WITH GI DOCTOR.   IMPORTANT INFORMATION: PAY CLOSE ATTENTION   PHYSICIAN DISCHARGE INSTRUCTIONS  Follow with Primary care provider  Richardean Chimera, MD  and other consultants as instructed by your Hospitalist Physician  SEEK MEDICAL CARE OR RETURN TO EMERGENCY ROOM IF SYMPTOMS COME BACK, WORSEN OR NEW PROBLEM DEVELOPS   Please note: You were cared for by a hospitalist during your hospital stay. Every effort will be made to forward records to your primary care provider.  You can request that your primary care provider send for your hospital records if they have not received them.  Once you are discharged, your primary care physician will handle any further medical issues. Please note that NO REFILLS for any discharge medications will be authorized once you are discharged, as it is imperative that you return to your primary care physician (or establish a relationship with a primary care physician if you do not have one) for your post hospital discharge needs so that they can reassess your need for medications and monitor your lab values.  Please get a complete blood count and chemistry panel checked by your Primary MD at your next visit, and again as instructed by your Primary MD.  Get Medicines reviewed and adjusted: Please take all your medications with you for your next visit with your Primary MD  Laboratory/radiological data: Please request your Primary MD to go over all hospital tests and procedure/radiological results at the follow up, please ask your primary care provider to get all Hospital records sent to his/her office.  In some cases, they will be blood work, cultures and biopsy results pending at the time of your discharge. Please  request that your primary care provider follow up on these results.  If you are diabetic, please bring your blood sugar readings with you to your follow up appointment with primary care.    Please call and make your follow up appointments as soon as possible.    Also Note the following: If you experience worsening of your admission symptoms, develop shortness of breath, life threatening emergency, suicidal or homicidal thoughts you must seek medical attention immediately by calling 911 or calling your MD immediately  if symptoms less severe.  You must read complete instructions/literature along with all the possible adverse reactions/side effects for all the Medicines you take and that have been prescribed to you. Take any new Medicines after you have completely understood and accpet all the possible adverse reactions/side effects.   Do not drive when taking Pain medications or sleeping medications (Benzodiazepines)  Do not take more than prescribed Pain, Sleep and Anxiety Medications. It is not advisable to combine anxiety,sleep and pain medications without talking with your primary care practitioner  Special Instructions: If you have smoked or chewed Tobacco  in the last 2 yrs please stop smoking, stop any regular Alcohol  and or any Recreational drug use.  Wear Seat belts while driving.  Do not drive if taking any narcotic, mind altering or controlled substances or recreational drugs or alcohol.

## 2021-12-21 NOTE — Discharge Summary (Signed)
Physician Discharge Summary  Chelsea [name] [id number] DOB: June 09, 1956 DOA: 12/16/2021  PCP: [name], MD GI: [name] Cardiologist: [name]   Admit date: 12/16/2021 Discharge date: 12/21/2021  Admitted From:  Home  Disposition: Home   Recommendations for Outpatient Follow-up:  Follow up with Dr. [name] in 1-2 weeks for repeat EGD and banding Please hold warfarin until after repeat EGD and ok with GI  Please follow up with cardiologist in 2-3 weeks   Discharge Condition: Stable   CODE STATUS: DNR DIET: resume prior low sodium carb modified diet    Brief Hospitalization Summary: Please see all hospital notes, images, labs for full details of the hospitalization. 66 y.o. female, with past medical history significant for Chelsea [name] liver cirrhosis, hypertension, type 2 diabetes mellitus, atrial fibrillation on warfarin, CHF, hyperlipidemia, COPD, iron deficiency anemia requiring transfusions, vaginal varices status post banding, patient was sent by her PCP for low hemoglobin level, patient had a visit with her PCP yesterday, where she was called this morning she was asked to come to ED for transfusion given very low hemoglobin, she reports she required transfusion last November, she does report generalized weakness, fatigue, out of energy, short of breath with exertion, and some lightheadedness as well, she denies any chest pain, focal deficits, she denies any nausea, vomiting or coffee-ground emesis, she does report dark-colored stools over , but no bright red blood per rectum, she denies any NSAIDs use.  In ED labs were significant for hemoglobin of 4, and INR of 5.6, potassium low at 3.2, glucose elevated at 231, her platelet were low at 53k, her EKG showing A-fib with RVR heart rate of 123, [facility] hospitalist consulted to admit.  Hospital Course by Problem:   Assessment and Plan: * Acute blood loss anemia -- presumed upper GI bleed in setting of supratherapeutic PT/INR  --  continue octreotide and protonix  -- EGD completed with Dr. Gala Romney in hospital.  -- s/p 3 units PRBC with Hg up to 7.1.  2 additional units PRBC given 5/26, Hg up to 9. -- s/p vitamin K and 2 units FFP -- hg holding stable at 9.    Supratherapeutic INR -- INR now at 2.0.  -- warfarin on hold until after next EGD  Esophageal varices (HCC) -- Remains on IV octreotide infusion x 72 hours -- GI following EGD with Dr. Gala Romney: she had grade 3 esophageal varices with bleeding stigmata s/p banding and diffuse portal hypertensive gastropathy -- she completed ceftriaxone for SBP prophylaxis x 5 days and once daily protonix started.  -- I spoke with Dr. Gala Romney, he agrees patient can DC and have outpatient EGD.  He made arrangements with Dr. [name] to get her in for EGD in next 1-2 weeks.  Pt was instructed to hold warfarin until she has completed repeat EGD and ok with her GI physician to restart.  Pt verbalized understanding.     Liver cirrhosis secondary to NASH [facility]) -- appreciate GI consult and recommendations -- pt is being referred for liver transplant by GI service   Sleep apnea -- offered CPAP therapy in hospital    Chronic anticoagulation -- temporarily holding warfarin in setting of GI bleeding and severe anemia  -- s/p 2 units ffp and vitamin K -- GI requested holding warfarin until after next EGD in 1-2 weeks  Atrial fibrillation, chronic (HCC) -- HR much better controlled now on oral diltiazem.  -- GI requested to hold warfarin until repeat EGD in 1-2 weeks  Essential hypertension, benign --  suboptimally controlled on diltiazem CD 120 mg, Increased dose to 180 mg daily.    -- GI planning to add nonselective beta blocker after next banding session  Diabetes mellitus ([place]) -- well managed on SSI and frequent CBG monitoring CBG (last 3)  Recent Labs    12/20/21 2119 12/21/21 0307 12/21/21 0718  GLUCAP 196* 174* 175*     Discharge Diagnoses:  Principal Problem:   Acute  blood loss anemia Active Problems:   Diabetes mellitus ([place])   Essential hypertension, benign   Atrial fibrillation, chronic (HCC)   Chronic anticoagulation   Sleep apnea   Liver cirrhosis secondary to NASH ([place])   Esophageal varices ([place])   Supratherapeutic INR   Discharge Instructions:  Allergies as of 12/21/2021       Reactions   Bee Venom Anaphylaxis   Lisinopril Cough        Medication List     STOP taking these medications    diltiazem 60 MG tablet Commonly known as: CARDIZEM   warfarin 6 MG tablet Commonly known as: COUMADIN       TAKE these medications    diltiazem 180 MG 24 hr capsule Commonly known as: CARDIZEM CD Take 1 capsule (180 mg total) by mouth daily. What changed:  medication strength how much to take   furosemide 40 MG tablet Commonly known as: LASIX TAKE 1 TABLET BY MOUTH EVERY DAY AS NEEDED What changed: when to take this   metFORMIN 1000 MG tablet Commonly known as: GLUCOPHAGE Take 1,000 mg by mouth 2 (two) times daily with a meal.   pantoprazole 40 MG tablet Commonly known as: PROTONIX Take 1 tablet (40 mg total) by mouth daily. Start taking on: Dec 22, 2021   vitamin B-12 1000 MCG tablet Commonly known as: CYANOCOBALAMIN Take 1,000 mcg by mouth 3 (three) times a week.   Vitamin D (Ergocalciferol) 1.25 MG (50000 UNIT) Caps capsule Commonly known as: DRISDOL Take 50,000 Units by mouth every Monday.        Follow-up Information     [name], MD. Schedule an appointment as soon as possible for a visit in 2 week(s).   Specialty: Family Medicine Why: Hospital Follow Up Contact information: [place] [address] [phone]         Arnoldo Lenis, MD. Schedule an appointment as soon as possible for a visit in 2 week(s).   Specialty: Cardiology Why: Hospital Follow Up Contact information: [place] [address] [phone]         Montez Morita, Quillian Quince, MD.  Schedule an appointment as soon as possible for a visit in 1 week(s).   Specialty: Gastroenterology Why: Hospital Follow Up for EGD Contact information: [address] [place] [address] [phone]                Allergies  Allergen Reactions   Bee Venom Anaphylaxis   Lisinopril Cough   Allergies as of 12/21/2021       Reactions   Bee Venom Anaphylaxis   Lisinopril Cough        Medication List     STOP taking these medications    diltiazem 60 MG tablet Commonly known as: CARDIZEM   warfarin 6 MG tablet Commonly known as: COUMADIN       TAKE these medications    diltiazem 180 MG 24 hr capsule Commonly known as: CARDIZEM CD Take 1 capsule (180 mg total) by mouth  daily. What changed:  medication strength how much to take   furosemide 40 MG tablet Commonly known as: LASIX TAKE 1 TABLET BY MOUTH EVERY DAY AS NEEDED What changed: when to take this   metFORMIN 1000 MG tablet Commonly known as: GLUCOPHAGE Take 1,000 mg by mouth 2 (two) times daily with a meal.   pantoprazole 40 MG tablet Commonly known as: PROTONIX Take 1 tablet (40 mg total) by mouth daily. Start taking on: Dec 22, 2021   vitamin B-12 1000 MCG tablet Commonly known as: CYANOCOBALAMIN Take 1,000 mcg by mouth 3 (three) times a week.   Vitamin D (Ergocalciferol) 1.25 MG (50000 UNIT) Caps capsule Commonly known as: DRISDOL Take 50,000 Units by mouth every Monday.        Procedures/Studies: No results found.   Subjective: Pt reports that she is feeling well today and decided she will have follow up EGD as an outpatient and go home today.    Discharge Exam: Vitals:   12/20/21 2116 12/21/21 0543  BP: (!) 149/77 131/68  Pulse: 67 68  Resp: 20 19  Temp: 98.6 F (37 C) 98.4 F (36.9 C)  SpO2: 100% 100%   Vitals:   12/20/21 0600 12/20/21 1408 12/20/21 2116 12/21/21 0543  BP: (!) 157/80 (!) 159/76 (!) 149/77 131/68  Pulse: 70 72 67 68  Resp: 20 19 20  19   Temp: 98.1 F (36.7 C) 98.2 F (36.8 C) 98.6 F (37 C) 98.4 F (36.9 C)  TempSrc: Oral Oral Oral Oral  SpO2: 97% 100% 100% 100%  Weight:      Height:       General: Pt is alert, awake, not in acute distress Cardiovascular: normal S1/S2 +, no rubs, no gallops Respiratory: CTA bilaterally, no wheezing, no rhonchi Abdominal: Soft, NT, ND, bowel sounds + Extremities: trace pretibial edema, no cyanosis   The results of significant diagnostics from this hospitalization (including imaging, microbiology, ancillary and laboratory) are listed below for reference.     Microbiology: Recent Results (from the past 240 hour(s))  MRSA Next Gen by PCR, Nasal     Status: None   Collection Time: 12/16/21  6:03 PM   Specimen: Nasal Mucosa; Nasal Swab  Result Value Ref Range Status   MRSA by PCR Next Gen NOT DETECTED NOT DETECTED Final    Comment: (NOTE) The GeneXpert MRSA Assay (FDA approved for NASAL specimens only), is one component of a comprehensive MRSA colonization surveillance program. It is not intended to diagnose MRSA infection nor to guide or monitor treatment for MRSA infections. Test performance is not FDA approved in patients less than 1 years old. Performed at [facility], [address], [place], [place] [address]      Labs: BNP (last 3 results) No results for input(s): BNP in the last 8760 hours. Basic Metabolic Panel: Recent Labs  Lab 12/16/21 1517 12/17/21 0914 12/18/21 0414 12/19/21 0423 12/20/21 0354 12/21/21 0419  NA 135 138 139 139 138 137  K 3.2* 3.8 3.8 3.4* 3.6 3.6  CL 106 110 112* 111 113* 112*  CO2 24 25 23 24 22 23   GLUCOSE 231* 136* 182* 200* 182* 180*  BUN 15 14 15 13 13 15   CREATININE 1.07* 1.15* 1.13* 1.11* 1.00 1.03*  CALCIUM 9.2 8.9 8.7* 8.8* 8.8* 9.1  MG 1.6*  --  1.9  --   --   --    Liver Function Tests: Recent Labs  Lab 12/17/21 0914 12/18/21 0414 12/19/21 0423 12/20/21 0354 12/21/21 0419  AST 54* 48* 38 28 27  ALT 23 21 21  19 17   ALKPHOS 76 71 78 70 74  BILITOT 2.7* 1.9* 2.1* 1.5* 1.9*  PROT 6.9 6.7 7.4 7.0 7.5  ALBUMIN 2.5* 2.5* 2.7* 2.4* 2.6*   No results for input(s): LIPASE, AMYLASE in the last 168 hours. No results for input(s): AMMONIA in the last 168 hours. CBC: Recent Labs  Lab 12/17/21 0914 12/18/21 0414 12/18/21 1313 12/19/21 0423 12/20/21 0354 12/21/21 0419  WBC 5.3 4.9  --  7.0 6.3 5.7  HGB 7.1* 6.9* 9.6* 9.4* 9.1* 9.3*  HCT 23.0* 23.0* 30.9* 30.7* 29.9* 30.0*  MCV 74.9* 76.2*  --  78.3* 78.5* 80.0  PLT 74* 69*  --  76* 62* 70*   Cardiac Enzymes: No results for input(s): CKTOTAL, CKMB, CKMBINDEX, TROPONINI in the last 168 hours. BNP: Invalid input(s): POCBNP CBG: Recent Labs  Lab 12/20/21 1107 12/20/21 1615 12/20/21 2119 12/21/21 0307 12/21/21 0718  GLUCAP 254* 239* 196* 174* 175*   D-Dimer No results for input(s): DDIMER in the last 72 hours. Hgb A1c No results for input(s): HGBA1C in the last 72 hours. Lipid Profile No results for input(s): CHOL, HDL, LDLCALC, TRIG, CHOLHDL, LDLDIRECT in the last 72 hours. Thyroid function studies No results for input(s): TSH, T4TOTAL, T3FREE, THYROIDAB in the last 72 hours.  Invalid input(s): FREET3 Anemia work up No results for input(s): VITAMINB12, FOLATE, FERRITIN, TIBC, IRON, RETICCTPCT in the last 72 hours. Urinalysis    Component Value Date/Time   COLORURINE YELLOW 12/13/2020 1342   APPEARANCEUR CLOUDY (A) 12/13/2020 1342   LABSPEC 1.012 12/13/2020 1342   PHURINE 5.0 12/13/2020 1342   GLUCOSEU 50 (A) 12/13/2020 1342   HGBUR MODERATE (A) 12/13/2020 1342   BILIRUBINUR NEGATIVE 12/13/2020 1342   KETONESUR NEGATIVE 12/13/2020 1342   PROTEINUR NEGATIVE 12/13/2020 1342   NITRITE NEGATIVE 12/13/2020 1342   LEUKOCYTESUR LARGE (A) 12/13/2020 1342   Sepsis Labs Invalid input(s): PROCALCITONIN,  WBC,  LACTICIDVEN Microbiology Recent Results (from the past 240 hour(s))  MRSA Next Gen by PCR, Nasal     Status: None    Collection Time: 12/16/21  6:03 PM   Specimen: Nasal Mucosa; Nasal Swab  Result Value Ref Range Status   MRSA by PCR Next Gen NOT DETECTED NOT DETECTED Final    Comment: (NOTE) The GeneXpert MRSA Assay (FDA approved for NASAL specimens only), is one component of a comprehensive MRSA colonization surveillance program. It is not intended to diagnose MRSA infection nor to guide or monitor treatment for MRSA infections. Test performance is not FDA approved in patients less than 55 years old. Performed at [facility], [address] [place], [place] [address]    Time coordinating discharge:  35 mins   SIGNED:  Irwin Brakeman, MD  [facility] 12/21/2021, 10:54 AM How to contact the [facility] Attending or Consulting provider [place] or covering provider during after hours [place], for this patient?  Check the care team in [facility] and look for a) attending/consulting [facility] provider listed and b) the [facility] team listed Log into [email or web address] and use [place]'s universal password to access. If you do not have the password, please contact the hospital operator. Locate the [facility] provider you are looking for under [facility] and page to a number that you can be directly reached. If you still have difficulty reaching the provider, please page the [facility] (Director on Call) for the Hospitalists listed on amion for assistance.

## 2021-12-22 ENCOUNTER — Other Ambulatory Visit (INDEPENDENT_AMBULATORY_CARE_PROVIDER_SITE_OTHER): Payer: Self-pay

## 2021-12-22 ENCOUNTER — Encounter (INDEPENDENT_AMBULATORY_CARE_PROVIDER_SITE_OTHER): Payer: Self-pay

## 2021-12-22 ENCOUNTER — Telehealth: Payer: Self-pay | Admitting: Gastroenterology

## 2021-12-22 DIAGNOSIS — I8510 Secondary esophageal varices without bleeding: Secondary | ICD-10-CM

## 2021-12-22 NOTE — Telephone Encounter (Signed)
This patient needs a repeat EGD at the end of this week or early next week. She was seen in the hospital for esophageal varices s/p banding. She is a [facility] patient. ASA 3. Needs to hold her oral diabetes medication the night prior to and morning of procedure.   Brooke Bonito, MSN, FNP-BC, AGACNP-BC [facility]

## 2021-12-23 ENCOUNTER — Ambulatory Visit (HOSPITAL_COMMUNITY)
Admission: RE | Admit: 2021-12-23 | Discharge: 2021-12-23 | Disposition: A | Discharge status: Home / Self Care | Payer: PRIVATE HEALTH INSURANCE | Source: Ambulatory Visit | Attending: Gastroenterology | Admitting: Gastroenterology

## 2021-12-23 ENCOUNTER — Encounter (HOSPITAL_COMMUNITY): Payer: Self-pay | Admitting: Internal Medicine

## 2021-12-23 DIAGNOSIS — K7581 Nonalcoholic steatohepatitis (NASH): Secondary | ICD-10-CM | POA: Insufficient documentation

## 2021-12-23 DIAGNOSIS — K7460 Unspecified cirrhosis of liver: Secondary | ICD-10-CM | POA: Insufficient documentation

## 2021-12-25 NOTE — Patient Instructions (Signed)
Chelsea [name]  12/25/2021     @PREFPERIOPPHARMACY @   Your procedure is scheduled on 01/19/2022.  Report to [facility].  Call this number if you have problems the morning of surgery:  [phone]   Remember:  Do not eat or drink after midnight.   Take these medicines the morning of surgery with A SIP OF WATER diltiazem & protonix.  Continue to hold coumadinuntil after procedure.    Do not wear jewelry, make-up or nail polish.  Do not wear lotions, powders, or perfumes, or deodorant.  Do not shave 48 hours prior to surgery.  Men may shave face and neck.  Do not bring valuables to the hospital.  [facility] is not responsible for any belongings or valuables.  Contacts, dentures or bridgework may not be worn into surgery.  Leave your suitcase in the car.  After surgery it may be brought to your room.  For patients admitted to the hospital, discharge time will be determined by your treatment team.  Patients discharged the day of surgery will not be allowed to drive home.   Name and phone number of your driver:   family Special instructions:    Please read over the following fact sheets that you were given. Anesthesia Post-op Instructions and Care and Recovery After Surgery      PATIENT INSTRUCTIONS POST-ANESTHESIA  IMMEDIATELY FOLLOWING SURGERY:  Do not drive or operate machinery for the first twenty four hours after surgery.  Do not make any important decisions for twenty four hours after surgery or while taking narcotic pain medications or sedatives.  If you develop intractable nausea and vomiting or a severe headache please notify your doctor immediately.  FOLLOW-UP:  Please make an appointment with your surgeon as instructed. You do not need to follow up with anesthesia unless specifically instructed to do so.  WOUND CARE INSTRUCTIONS (if applicable):  Keep a dry clean dressing on the anesthesia/puncture wound site if there is drainage.  Once the wound has  quit draining you may leave it open to air.  Generally you should leave the bandage intact for twenty four hours unless there is drainage.  If the epidural site drains for more than 36-48 hours please call the anesthesia department.  QUESTIONS?:  Please feel free to call your physician or the hospital operator if you have any questions, and they will be happy to assist you.      Upper Endoscopy, Adult Upper endoscopy is a procedure to look inside the upper GI (gastrointestinal) tract. The upper GI tract is made up of: The esophagus. This is the part of the body that moves food from your mouth to your stomach. The stomach. The duodenum. This is the first part of your small intestine. This procedure is also called esophagogastroduodenoscopy (EGD) or gastroscopy. In this procedure, your health care provider passes a thin, flexible tube (endoscope) through your mouth and down your esophagus into your stomach and into your duodenum. A small camera is attached to the end of the tube. Images from the camera appear on a monitor in the exam room. During this procedure, your health care provider may also remove a small piece of tissue to be sent to a lab and examined under a microscope (biopsy). Your health care provider may do an upper endoscopy to diagnose cancers of the upper GI tract. You may also have this procedure to find the cause of other conditions, such as: Stomach pain. Heartburn. Pain or problems when  swallowing. Nausea and vomiting. Stomach bleeding. Stomach ulcers. Tell a health care provider about: Any allergies you have. All medicines you are taking, including vitamins, herbs, eye drops, creams, and over-the-counter medicines. Any problems you or family members have had with anesthetic medicines. Any bleeding problems you have. Any surgeries you have had. Any medical conditions you have. Whether you are pregnant or may be pregnant. What are the risks? Generally, this is a safe  procedure. However, problems may occur, including: Infection. Bleeding. Allergic reactions to medicines. A tear or hole (perforation) in the esophagus, stomach, or duodenum. What happens before the procedure? When to stop eating and drinking Follow instructions from your health care provider about what you may eat and drink before your procedure. These may include: 8 hours before your procedure Stop eating most foods. Do not eat meat, fried foods, or fatty foods. Eat only light foods, such as toast or crackers. All liquids are okay except energy drinks and alcohol. 6 hours before your procedure Stop eating. Drink only clear liquids, such as water, clear fruit juice, black coffee, plain tea, and sports drinks. Do not drink energy drinks or alcohol. 2 hours before your procedure Stop drinking all liquids. You may be allowed to take medicines with small sips of water. If you do not follow your health care provider's instructions, your procedure may be delayed or canceled. Medicines Ask your health care provider about: Changing or stopping your regular medicines. This is especially important if you are taking diabetes medicines or blood thinners. Taking medicines such as aspirin and ibuprofen. These medicines can thin your blood. Do not take these medicines unless your health care provider tells you to take them. Taking over-the-counter medicines, vitamins, herbs, and supplements. General instructions If you will be going home right after the procedure, plan to have a responsible adult: Take you home from the hospital or clinic. You will not be allowed to drive. Care for you for the time you are told. What happens during the procedure?  An IV will be inserted into one of your veins. You may be given one or more of the following: A medicine to help you relax (sedative). A medicine to numb the throat (local anesthetic). You will lie on your left side on an exam table. Your health care  provider will pass the endoscope through your mouth and down your esophagus. Your health care provider will use the scope to check the inside of your esophagus, stomach, and duodenum. Biopsies may be taken. The endoscope will be removed. The procedure may vary among health care providers and hospitals. What can I expect after the procedure? After your procedure, it is common to have: A sore throat. Mild stomach pain or discomfort. Bloating. Nausea. When your throat is no longer numb, you may be given some fluids to drink. Your blood pressure, heart rate, breathing rate, and blood oxygen level will be monitored until you leave the hospital or clinic. It is up to you to get the results of your procedure. Ask your health care provider, or the department that is doing the procedure, when your results will be ready. Follow these instructions at home:  Follow instructions from your health care provider about eating or drinking restrictions. Take over-the-counter and prescription medicines only as told by your health care provider. Return to your normal activities as told by your health care provider. Ask your health care provider what activities are safe for you. If you were given a sedative during the procedure, it can  affect you for several hours. Do not drive or operate machinery until your health care provider says that it is safe. Keep all follow-up visits. This is important. Contact a health care provider if: You have a sore throat that lasts longer than 1 day. You have trouble swallowing. Get help right away if: You vomit blood or your vomit looks like coffee grounds. You have a fever. You have bloody, black, or tarry stools. You have a severe sore throat or you cannot swallow. You have difficulty breathing or severe pain in your chest. These symptoms may be an emergency. Get help right away. Call 911. Do not wait to see if the symptoms will go away. Do not drive yourself to the  hospital. Summary Upper endoscopy is a procedure to look inside the upper GI tract. During the procedure, an IV will be inserted into one of your veins. You may be given a medicine to help you relax. The endoscope will be passed through your mouth and down your esophagus. After the procedure, follow instructions from your health care provider about eating or drinking restrictions. This information is not intended to replace advice given to you by your health care provider. Make sure you discuss any questions you have with your health care provider. Document Revised: 04/07/2021 Document Reviewed: 04/07/2021 Elsevier Patient Education  [place] Endoscopy, Adult, Care After This sheet gives you information about how to care for yourself after your procedure. Your health care provider may also give you more specific instructions. If you have problems or questions, contact your health care provider. What can I expect after the procedure? After the procedure, it is common to have: A sore throat. Mild stomach pain or discomfort. Bloating. Nausea. Follow these instructions at home:  Follow instructions from your health care provider about what to eat or drink after your procedure. Return to your normal activities as told by your health care provider. Ask your health care provider what activities are safe for you. Take over-the-counter and prescription medicines only as told by your health care provider. If you were given a sedative during the procedure, it can affect you for several hours. Do not drive or operate machinery until your health care provider says that it is safe. Keep all follow-up visits as told by your health care provider. This is important. Contact a health care provider if you have: A sore throat that lasts longer than one day. Trouble swallowing. Get help right away if: You vomit blood or your vomit looks like coffee grounds. You have: A fever. Bloody,  black, or tarry stools. A severe sore throat or you cannot swallow. Difficulty breathing. Severe pain in your chest or abdomen. Summary After the procedure, it is common to have a sore throat, mild stomach discomfort, bloating, and nausea. If you were given a sedative during the procedure, it can affect you for several hours. Do not drive or operate machinery until your health care provider says that it is safe. Follow instructions from your health care provider about what to eat or drink after your procedure. Return to your normal activities as told by your health care provider. This information is not intended to replace advice given to you by your health care provider. Make sure you discuss any questions you have with your health care provider. Document Revised: 05/18/2019 Document Reviewed: 12/12/2017 Elsevier Patient Education  [place].

## 2021-12-28 ENCOUNTER — Encounter (HOSPITAL_COMMUNITY)
Admission: RE | Admit: 2021-12-28 | Discharge: 2021-12-28 | Disposition: A | Discharge status: Home / Self Care | Payer: No Typology Code available for payment source | Source: Ambulatory Visit | Attending: Gastroenterology | Admitting: Gastroenterology

## 2021-12-28 VITALS — BP 174/80 | HR 92 | Temp 98.4°F | Resp 18 | Ht 67.0 in | Wt 223.5 lb

## 2021-12-28 DIAGNOSIS — I482 Chronic atrial fibrillation, unspecified: Secondary | ICD-10-CM | POA: Insufficient documentation

## 2021-12-28 DIAGNOSIS — R791 Abnormal coagulation profile: Secondary | ICD-10-CM

## 2021-12-28 DIAGNOSIS — Z01818 Encounter for other preprocedural examination: Secondary | ICD-10-CM | POA: Insufficient documentation

## 2021-12-28 DIAGNOSIS — D62 Acute posthemorrhagic anemia: Secondary | ICD-10-CM | POA: Insufficient documentation

## 2021-12-28 DIAGNOSIS — K7460 Unspecified cirrhosis of liver: Secondary | ICD-10-CM | POA: Insufficient documentation

## 2021-12-28 DIAGNOSIS — I8510 Secondary esophageal varices without bleeding: Secondary | ICD-10-CM | POA: Insufficient documentation

## 2021-12-28 LAB — CBC
HCT: 29.7 % — ABNORMAL LOW (ref 36.0–46.0)
Hemoglobin: 9 g/dL — ABNORMAL LOW (ref 12.0–15.0)
MCH: 24.6 pg — ABNORMAL LOW (ref 26.0–34.0)
MCHC: 30.3 g/dL (ref 30.0–36.0)
MCV: 81.1 fL (ref 80.0–100.0)
Platelets: 56 10*3/uL — ABNORMAL LOW (ref 150–400)
RBC: 3.66 MIL/uL — ABNORMAL LOW (ref 3.87–5.11)
RDW: 24.2 % — ABNORMAL HIGH (ref 11.5–15.5)
WBC: 4.3 10*3/uL (ref 4.0–10.5)
nRBC: 0 % (ref 0.0–0.2)

## 2021-12-28 LAB — BASIC METABOLIC PANEL
Anion gap: 4 — ABNORMAL LOW (ref 5–15)
BUN: 10 mg/dL (ref 8–23)
CO2: 23 mmol/L (ref 22–32)
Calcium: 9.3 mg/dL (ref 8.9–10.3)
Chloride: 112 mmol/L — ABNORMAL HIGH (ref 98–111)
Creatinine, Ser: 0.79 mg/dL (ref 0.44–1.00)
GFR, Estimated: >60 mL/min (ref >60)
Glucose, Bld: 200 mg/dL — ABNORMAL HIGH (ref 70–99)
Potassium: 3.6 mmol/L (ref 3.5–5.1)
Sodium: 139 mmol/L (ref 135–145)

## 2021-12-29 ENCOUNTER — Inpatient Hospital Stay (HOSPITAL_COMMUNITY): Payer: No Typology Code available for payment source

## 2021-12-29 ENCOUNTER — Inpatient Hospital Stay (HOSPITAL_COMMUNITY): Payer: No Typology Code available for payment source | Admitting: Anesthesiology

## 2021-12-29 ENCOUNTER — Ambulatory Visit (HOSPITAL_COMMUNITY): Payer: No Typology Code available for payment source | Admitting: Anesthesiology

## 2021-12-29 ENCOUNTER — Encounter (HOSPITAL_COMMUNITY): Admission: AD | Disposition: E | Payer: Self-pay | Source: Ambulatory Visit | Attending: Pulmonary Disease

## 2021-12-29 ENCOUNTER — Encounter (HOSPITAL_COMMUNITY): Payer: Self-pay | Admitting: Gastroenterology

## 2021-12-29 ENCOUNTER — Inpatient Hospital Stay (HOSPITAL_COMMUNITY)
Admission: AD | Admit: 2021-12-29 | Discharge: 2022-01-23 | DRG: 405 | Disposition: E | Payer: No Typology Code available for payment source | Source: Ambulatory Visit | Attending: Internal Medicine | Admitting: Internal Medicine

## 2021-12-29 DIAGNOSIS — G9349 Other encephalopathy: Secondary | ICD-10-CM | POA: Diagnosis not present

## 2021-12-29 DIAGNOSIS — I480 Paroxysmal atrial fibrillation: Secondary | ICD-10-CM | POA: Diagnosis present

## 2021-12-29 DIAGNOSIS — E669 Obesity, unspecified: Secondary | ICD-10-CM | POA: Diagnosis present

## 2021-12-29 DIAGNOSIS — I8510 Secondary esophageal varices without bleeding: Secondary | ICD-10-CM | POA: Diagnosis not present

## 2021-12-29 DIAGNOSIS — I110 Hypertensive heart disease with heart failure: Secondary | ICD-10-CM

## 2021-12-29 DIAGNOSIS — Z66 Do not resuscitate: Secondary | ICD-10-CM | POA: Diagnosis present

## 2021-12-29 DIAGNOSIS — Z95828 Presence of other vascular implants and grafts: Secondary | ICD-10-CM | POA: Diagnosis not present

## 2021-12-29 DIAGNOSIS — R531 Weakness: Secondary | ICD-10-CM | POA: Diagnosis not present

## 2021-12-29 DIAGNOSIS — N1831 Chronic kidney disease, stage 3a: Secondary | ICD-10-CM | POA: Diagnosis present

## 2021-12-29 DIAGNOSIS — L89891 Pressure ulcer of other site, stage 1: Secondary | ICD-10-CM | POA: Diagnosis not present

## 2021-12-29 DIAGNOSIS — N179 Acute kidney failure, unspecified: Secondary | ICD-10-CM | POA: Diagnosis not present

## 2021-12-29 DIAGNOSIS — K7460 Unspecified cirrhosis of liver: Secondary | ICD-10-CM

## 2021-12-29 DIAGNOSIS — D631 Anemia in chronic kidney disease: Secondary | ICD-10-CM | POA: Diagnosis present

## 2021-12-29 DIAGNOSIS — I472 Ventricular tachycardia, unspecified: Secondary | ICD-10-CM | POA: Diagnosis not present

## 2021-12-29 DIAGNOSIS — D62 Acute posthemorrhagic anemia: Secondary | ICD-10-CM | POA: Diagnosis not present

## 2021-12-29 DIAGNOSIS — J690 Pneumonitis due to inhalation of food and vomit: Secondary | ICD-10-CM | POA: Diagnosis not present

## 2021-12-29 DIAGNOSIS — Z515 Encounter for palliative care: Secondary | ICD-10-CM

## 2021-12-29 DIAGNOSIS — R34 Anuria and oliguria: Secondary | ICD-10-CM | POA: Diagnosis not present

## 2021-12-29 DIAGNOSIS — R1312 Dysphagia, oropharyngeal phase: Secondary | ICD-10-CM | POA: Diagnosis not present

## 2021-12-29 DIAGNOSIS — R188 Other ascites: Secondary | ICD-10-CM

## 2021-12-29 DIAGNOSIS — G4730 Sleep apnea, unspecified: Secondary | ICD-10-CM | POA: Diagnosis present

## 2021-12-29 DIAGNOSIS — I8511 Secondary esophageal varices with bleeding: Secondary | ICD-10-CM | POA: Diagnosis present

## 2021-12-29 DIAGNOSIS — R410 Disorientation, unspecified: Secondary | ICD-10-CM | POA: Diagnosis not present

## 2021-12-29 DIAGNOSIS — R161 Splenomegaly, not elsewhere classified: Secondary | ICD-10-CM | POA: Diagnosis present

## 2021-12-29 DIAGNOSIS — E1165 Type 2 diabetes mellitus with hyperglycemia: Secondary | ICD-10-CM | POA: Diagnosis present

## 2021-12-29 DIAGNOSIS — I130 Hypertensive heart and chronic kidney disease with heart failure and stage 1 through stage 4 chronic kidney disease, or unspecified chronic kidney disease: Secondary | ICD-10-CM | POA: Diagnosis present

## 2021-12-29 DIAGNOSIS — K766 Portal hypertension: Secondary | ICD-10-CM | POA: Diagnosis present

## 2021-12-29 DIAGNOSIS — Z6841 Body Mass Index (BMI) 40.0 and over, adult: Secondary | ICD-10-CM

## 2021-12-29 DIAGNOSIS — E8809 Other disorders of plasma-protein metabolism, not elsewhere classified: Secondary | ICD-10-CM | POA: Diagnosis not present

## 2021-12-29 DIAGNOSIS — Z8249 Family history of ischemic heart disease and other diseases of the circulatory system: Secondary | ICD-10-CM

## 2021-12-29 DIAGNOSIS — K767 Hepatorenal syndrome: Secondary | ICD-10-CM | POA: Diagnosis not present

## 2021-12-29 DIAGNOSIS — I160 Hypertensive urgency: Secondary | ICD-10-CM | POA: Diagnosis not present

## 2021-12-29 DIAGNOSIS — I509 Heart failure, unspecified: Secondary | ICD-10-CM | POA: Diagnosis not present

## 2021-12-29 DIAGNOSIS — K3189 Other diseases of stomach and duodenum: Secondary | ICD-10-CM

## 2021-12-29 DIAGNOSIS — J9811 Atelectasis: Secondary | ICD-10-CM | POA: Diagnosis not present

## 2021-12-29 DIAGNOSIS — Z781 Physical restraint status: Secondary | ICD-10-CM

## 2021-12-29 DIAGNOSIS — Z90710 Acquired absence of both cervix and uterus: Secondary | ICD-10-CM

## 2021-12-29 DIAGNOSIS — T45515A Adverse effect of anticoagulants, initial encounter: Secondary | ICD-10-CM | POA: Diagnosis present

## 2021-12-29 DIAGNOSIS — T508X5A Adverse effect of diagnostic agents, initial encounter: Secondary | ICD-10-CM | POA: Diagnosis not present

## 2021-12-29 DIAGNOSIS — I5032 Chronic diastolic (congestive) heart failure: Secondary | ICD-10-CM | POA: Diagnosis present

## 2021-12-29 DIAGNOSIS — K7581 Nonalcoholic steatohepatitis (NASH): Principal | ICD-10-CM | POA: Diagnosis present

## 2021-12-29 DIAGNOSIS — T888XXA Other specified complications of surgical and medical care, not elsewhere classified, initial encounter: Secondary | ICD-10-CM | POA: Diagnosis not present

## 2021-12-29 DIAGNOSIS — K922 Gastrointestinal hemorrhage, unspecified: Secondary | ICD-10-CM | POA: Diagnosis present

## 2021-12-29 DIAGNOSIS — Z91030 Bee allergy status: Secondary | ICD-10-CM

## 2021-12-29 DIAGNOSIS — J918 Pleural effusion in other conditions classified elsewhere: Secondary | ICD-10-CM | POA: Diagnosis present

## 2021-12-29 DIAGNOSIS — I4891 Unspecified atrial fibrillation: Secondary | ICD-10-CM | POA: Diagnosis not present

## 2021-12-29 DIAGNOSIS — D6832 Hemorrhagic disorder due to extrinsic circulating anticoagulants: Secondary | ICD-10-CM | POA: Diagnosis present

## 2021-12-29 DIAGNOSIS — I8500 Esophageal varices without bleeding: Secondary | ICD-10-CM | POA: Diagnosis present

## 2021-12-29 DIAGNOSIS — Z7901 Long term (current) use of anticoagulants: Secondary | ICD-10-CM

## 2021-12-29 DIAGNOSIS — K7682 Hepatic encephalopathy: Secondary | ICD-10-CM | POA: Diagnosis not present

## 2021-12-29 DIAGNOSIS — J810 Acute pulmonary edema: Secondary | ICD-10-CM | POA: Diagnosis not present

## 2021-12-29 DIAGNOSIS — J90 Pleural effusion, not elsewhere classified: Secondary | ICD-10-CM | POA: Diagnosis not present

## 2021-12-29 DIAGNOSIS — F419 Anxiety disorder, unspecified: Secondary | ICD-10-CM | POA: Diagnosis not present

## 2021-12-29 DIAGNOSIS — G4733 Obstructive sleep apnea (adult) (pediatric): Secondary | ICD-10-CM | POA: Diagnosis present

## 2021-12-29 DIAGNOSIS — L8990 Pressure ulcer of unspecified site, unspecified stage: Secondary | ICD-10-CM | POA: Diagnosis present

## 2021-12-29 DIAGNOSIS — E119 Type 2 diabetes mellitus without complications: Secondary | ICD-10-CM

## 2021-12-29 DIAGNOSIS — J9601 Acute respiratory failure with hypoxia: Secondary | ICD-10-CM | POA: Diagnosis not present

## 2021-12-29 DIAGNOSIS — R9431 Abnormal electrocardiogram [ECG] [EKG]: Secondary | ICD-10-CM

## 2021-12-29 DIAGNOSIS — D509 Iron deficiency anemia, unspecified: Secondary | ICD-10-CM | POA: Diagnosis present

## 2021-12-29 DIAGNOSIS — Z79899 Other long term (current) drug therapy: Secondary | ICD-10-CM

## 2021-12-29 DIAGNOSIS — E876 Hypokalemia: Secondary | ICD-10-CM | POA: Diagnosis not present

## 2021-12-29 DIAGNOSIS — N289 Disorder of kidney and ureter, unspecified: Secondary | ICD-10-CM | POA: Diagnosis not present

## 2021-12-29 DIAGNOSIS — E785 Hyperlipidemia, unspecified: Secondary | ICD-10-CM | POA: Diagnosis present

## 2021-12-29 DIAGNOSIS — J449 Chronic obstructive pulmonary disease, unspecified: Secondary | ICD-10-CM | POA: Diagnosis present

## 2021-12-29 DIAGNOSIS — N1411 Contrast-induced nephropathy: Secondary | ICD-10-CM | POA: Diagnosis not present

## 2021-12-29 DIAGNOSIS — R0609 Other forms of dyspnea: Secondary | ICD-10-CM | POA: Diagnosis not present

## 2021-12-29 DIAGNOSIS — G9340 Encephalopathy, unspecified: Secondary | ICD-10-CM | POA: Diagnosis not present

## 2021-12-29 DIAGNOSIS — I498 Other specified cardiac arrhythmias: Secondary | ICD-10-CM | POA: Diagnosis not present

## 2021-12-29 DIAGNOSIS — Z794 Long term (current) use of insulin: Secondary | ICD-10-CM

## 2021-12-29 DIAGNOSIS — Z87820 Personal history of traumatic brain injury: Secondary | ICD-10-CM

## 2021-12-29 DIAGNOSIS — Z7984 Long term (current) use of oral hypoglycemic drugs: Secondary | ICD-10-CM

## 2021-12-29 DIAGNOSIS — I10 Essential (primary) hypertension: Secondary | ICD-10-CM | POA: Diagnosis present

## 2021-12-29 DIAGNOSIS — I864 Gastric varices: Secondary | ICD-10-CM | POA: Diagnosis present

## 2021-12-29 DIAGNOSIS — E1122 Type 2 diabetes mellitus with diabetic chronic kidney disease: Secondary | ICD-10-CM | POA: Diagnosis present

## 2021-12-29 DIAGNOSIS — Z833 Family history of diabetes mellitus: Secondary | ICD-10-CM

## 2021-12-29 DIAGNOSIS — D696 Thrombocytopenia, unspecified: Secondary | ICD-10-CM | POA: Diagnosis not present

## 2021-12-29 HISTORY — PX: IR TIPS: IMG2295

## 2021-12-29 HISTORY — PX: ESOPHAGOGASTRODUODENOSCOPY (EGD) WITH PROPOFOL: SHX5813

## 2021-12-29 HISTORY — PX: IR EMBO VENOUS NOT HEMORR HEMANG  INC GUIDE ROADMAPPING: IMG5447

## 2021-12-29 HISTORY — PX: RADIOLOGY WITH ANESTHESIA: SHX6223

## 2021-12-29 HISTORY — PX: IR US GUIDE VASC ACCESS RIGHT: IMG2390

## 2021-12-29 HISTORY — PX: IR INTRAVASCULAR ULTRASOUND NON CORONARY: IMG6085

## 2021-12-29 HISTORY — PX: ESOPHAGEAL BANDING: SHX5518

## 2021-12-29 LAB — CBC
HCT: 31.8 % — ABNORMAL LOW (ref 36.0–46.0)
Hemoglobin: 9.6 g/dL — ABNORMAL LOW (ref 12.0–15.0)
MCH: 24.9 pg — ABNORMAL LOW (ref 26.0–34.0)
MCHC: 30.2 g/dL (ref 30.0–36.0)
MCV: 82.4 fL (ref 80.0–100.0)
Platelets: DECREASED 10*3/uL (ref 150–400)
RBC: 3.86 MIL/uL — ABNORMAL LOW (ref 3.87–5.11)
RDW: 24.6 % — ABNORMAL HIGH (ref 11.5–15.5)
WBC: 6.4 10*3/uL (ref 4.0–10.5)
nRBC: 0 % (ref 0.0–0.2)

## 2021-12-29 LAB — LACTIC ACID, PLASMA: Lactic Acid, Venous: 2.1 mmol/L (ref 0.5–1.9)

## 2021-12-29 LAB — CBC WITH DIFFERENTIAL/PLATELET
Abs Immature Granulocytes: 0.02 10*3/uL (ref 0.00–0.07)
Basophils Absolute: 0 10*3/uL (ref 0.0–0.1)
Basophils Relative: 1 %
Eosinophils Absolute: 0 10*3/uL (ref 0.0–0.5)
Eosinophils Relative: 1 %
HCT: 26 % — ABNORMAL LOW (ref 36.0–46.0)
Hemoglobin: 7.9 g/dL — ABNORMAL LOW (ref 12.0–15.0)
Immature Granulocytes: 1 %
Lymphocytes Relative: 11 %
Lymphs Abs: 0.4 10*3/uL — ABNORMAL LOW (ref 0.7–4.0)
MCH: 24.4 pg — ABNORMAL LOW (ref 26.0–34.0)
MCHC: 30.4 g/dL (ref 30.0–36.0)
MCV: 80.2 fL (ref 80.0–100.0)
Monocytes Absolute: 0.3 10*3/uL (ref 0.1–1.0)
Monocytes Relative: 8 %
Neutro Abs: 2.8 10*3/uL (ref 1.7–7.7)
Neutrophils Relative %: 78 %
Platelets: 51 10*3/uL — ABNORMAL LOW (ref 150–400)
RBC: 3.24 MIL/uL — ABNORMAL LOW (ref 3.87–5.11)
RDW: 24.3 % — ABNORMAL HIGH (ref 11.5–15.5)
WBC: 3.5 10*3/uL — ABNORMAL LOW (ref 4.0–10.5)
nRBC: 0 % (ref 0.0–0.2)

## 2021-12-29 LAB — POCT I-STAT 7, (LYTES, BLD GAS, ICA,H+H)
Acid-Base Excess: 0 mmol/L (ref 0.0–2.0)
Acid-base deficit: 1 mmol/L (ref 0.0–2.0)
Bicarbonate: 24 mmol/L (ref 20.0–28.0)
Bicarbonate: 22.6 mmol/L (ref 20.0–28.0)
Calcium, Ion: 1.32 mmol/L (ref 1.15–1.40)
Calcium, Ion: 1.34 mmol/L (ref 1.15–1.40)
HCT: 26 % — ABNORMAL LOW (ref 36.0–46.0)
HCT: 25 % — ABNORMAL LOW (ref 36.0–46.0)
Hemoglobin: 8.8 g/dL — ABNORMAL LOW (ref 12.0–15.0)
Hemoglobin: 8.5 g/dL — ABNORMAL LOW (ref 12.0–15.0)
O2 Saturation: 100 %
O2 Saturation: 99 %
Patient temperature: 97.6
Potassium: 3.8 mmol/L (ref 3.5–5.1)
Potassium: 3.7 mmol/L (ref 3.5–5.1)
Sodium: 143 mmol/L (ref 135–145)
Sodium: 144 mmol/L (ref 135–145)
TCO2: 25 mmol/L (ref 22–32)
TCO2: 24 mmol/L (ref 22–32)
pCO2 arterial: 32.8 mmHg (ref 32–48)
pCO2 arterial: 32.1 mmHg (ref 32–48)
pH, Arterial: 7.469 — ABNORMAL HIGH (ref 7.35–7.45)
pH, Arterial: 7.455 — ABNORMAL HIGH (ref 7.35–7.45)
pO2, Arterial: 195 mmHg — ABNORMAL HIGH (ref 83–108)
pO2, Arterial: 133 mmHg — ABNORMAL HIGH (ref 83–108)

## 2021-12-29 LAB — TYPE AND SCREEN
ABO/RH(D): O POS
Antibody Screen: NEGATIVE

## 2021-12-29 LAB — GLUCOSE, CAPILLARY
Glucose-Capillary: 184 mg/dL — ABNORMAL HIGH (ref 70–99)
Glucose-Capillary: 162 mg/dL — ABNORMAL HIGH (ref 70–99)
Glucose-Capillary: 178 mg/dL — ABNORMAL HIGH (ref 70–99)
Glucose-Capillary: 125 mg/dL — ABNORMAL HIGH (ref 70–99)
Glucose-Capillary: 110 mg/dL — ABNORMAL HIGH (ref 70–99)

## 2021-12-29 LAB — COMPREHENSIVE METABOLIC PANEL
ALT: 22 U/L (ref 0–44)
AST: 36 U/L (ref 15–41)
Albumin: 2.4 g/dL — ABNORMAL LOW (ref 3.5–5.0)
Alkaline Phosphatase: 78 U/L (ref 38–126)
Anion gap: 4 — ABNORMAL LOW (ref 5–15)
BUN: 8 mg/dL (ref 8–23)
CO2: 25 mmol/L (ref 22–32)
Calcium: 9.1 mg/dL (ref 8.9–10.3)
Chloride: 111 mmol/L (ref 98–111)
Creatinine, Ser: 0.67 mg/dL (ref 0.44–1.00)
GFR, Estimated: >60 mL/min (ref >60)
Glucose, Bld: 191 mg/dL — ABNORMAL HIGH (ref 70–99)
Potassium: 4.1 mmol/L (ref 3.5–5.1)
Sodium: 140 mmol/L (ref 135–145)
Total Bilirubin: 1.7 mg/dL — ABNORMAL HIGH (ref 0.3–1.2)
Total Protein: 7.4 g/dL (ref 6.5–8.1)

## 2021-12-29 LAB — ECHOCARDIOGRAM LIMITED
AR max vel: 1.44 cm2
AV Area VTI: 1.73 cm2
AV Area mean vel: 1.5 cm2
AV Mean grad: 6 mmHg
AV Peak grad: 11.7 mmHg
Ao pk vel: 1.71 m/s
Area-P 1/2: 2.86 cm2
Height: 67 in
S' Lateral: 3 cm
Weight: 3680 oz

## 2021-12-29 LAB — PREPARE RBC (CROSSMATCH)

## 2021-12-29 LAB — PROTIME-INR
INR: 1.6 — ABNORMAL HIGH (ref 0.8–1.2)
INR: 1.7 — ABNORMAL HIGH (ref 0.8–1.2)
Prothrombin Time: 19 seconds — ABNORMAL HIGH (ref 11.4–15.2)
Prothrombin Time: 20.1 seconds — ABNORMAL HIGH (ref 11.4–15.2)

## 2021-12-29 LAB — MRSA NEXT GEN BY PCR, NASAL: MRSA by PCR Next Gen: NOT DETECTED

## 2021-12-29 SURGERY — IR WITH ANESTHESIA
Anesthesia: General

## 2021-12-29 SURGERY — ESOPHAGOGASTRODUODENOSCOPY (EGD) WITH PROPOFOL
Anesthesia: General

## 2021-12-29 MED ORDER — IOHEXOL 300 MG/ML  SOLN
100.0000 mL | Freq: Once | INTRAMUSCULAR | Status: AC | PRN
  Administered 2021-12-29: 80 mL via INTRAVENOUS

## 2021-12-29 MED ORDER — LIDOCAINE HCL (PF) 2 % IJ SOLN
INTRAMUSCULAR | Status: AC
  Filled 2021-12-29: qty 5

## 2021-12-29 MED ORDER — ROCURONIUM BROMIDE 10 MG/ML (PF) SYRINGE
PREFILLED_SYRINGE | INTRAVENOUS | Status: DC | PRN
  Administered 2021-12-29: 40 mg via INTRAVENOUS
  Administered 2021-12-29 (×2): 30 mg via INTRAVENOUS

## 2021-12-29 MED ORDER — ALBUTEROL SULFATE (2.5 MG/3ML) 0.083% IN NEBU
2.5000 mg | INHALATION_SOLUTION | RESPIRATORY_TRACT | Status: DC | PRN
Start: 2021-12-29 — End: 2022-01-15
  Administered 2021-12-31 – 2022-01-09 (×4): 2.5 mg via RESPIRATORY_TRACT
  Filled 2021-12-29 (×4): qty 3

## 2021-12-29 MED ORDER — PANTOPRAZOLE INFUSION (NEW) - SIMPLE MED
8.0000 mg/h | INTRAVENOUS | Status: AC
  Administered 2021-12-29 – 2022-01-01 (×8): 8 mg/h via INTRAVENOUS
  Filled 2021-12-29 (×8): qty 80

## 2021-12-29 MED ORDER — SODIUM CHLORIDE 0.9 % IV SOLN: INTRAVENOUS | Status: DC

## 2021-12-29 MED ORDER — CHLORHEXIDINE GLUCONATE 0.12% ORAL RINSE (MEDLINE KIT)
15.0000 mL | Freq: Two times a day (BID) | OROMUCOSAL | Status: DC
Start: 2021-12-29 — End: 2022-01-01
  Administered 2021-12-29 – 2022-01-01 (×7): 15 mL via OROMUCOSAL

## 2021-12-29 MED ORDER — SODIUM CHLORIDE 0.9% IV SOLUTION: Freq: Once | INTRAVENOUS | Status: DC

## 2021-12-29 MED ORDER — PROPOFOL 500 MG/50ML IV EMUL
5.0000 ug/kg/min | INTRAVENOUS | Status: DC
  Administered 2021-12-29: 25 ug/kg/min via INTRAVENOUS
  Administered 2021-12-29: 65 ug/kg/min via INTRAVENOUS
  Administered 2021-12-29: 5 ug/kg/min via INTRAVENOUS
  Administered 2021-12-29: 15 ug/kg/min via INTRAVENOUS
  Administered 2021-12-29: 35 ug/kg/min via INTRAVENOUS
  Administered 2021-12-29: 55 ug/kg/min via INTRAVENOUS

## 2021-12-29 MED ORDER — ALBUTEROL SULFATE (2.5 MG/3ML) 0.083% IN NEBU
2.5000 mg | INHALATION_SOLUTION | Freq: Once | RESPIRATORY_TRACT | Status: AC
Start: 2021-12-29 — End: 2021-12-29
  Administered 2021-12-29: 2.5 mg via RESPIRATORY_TRACT

## 2021-12-29 MED ORDER — FUROSEMIDE 10 MG/ML IJ SOLN
10.0000 mg | Freq: Once | INTRAMUSCULAR | Status: AC
  Administered 2021-12-29: 10 mg via INTRAVENOUS
  Filled 2021-12-29: qty 2

## 2021-12-29 MED ORDER — ALBUTEROL SULFATE (2.5 MG/3ML) 0.083% IN NEBU
INHALATION_SOLUTION | RESPIRATORY_TRACT | Status: AC
  Filled 2021-12-29: qty 3

## 2021-12-29 MED ORDER — SUCCINYLCHOLINE 20MG/ML (10ML) SYRINGE FOR MEDFUSION PUMP - OPTIME
INTRAMUSCULAR | Status: DC | PRN
  Administered 2021-12-29: 100 mg via INTRAVENOUS

## 2021-12-29 MED ORDER — ROCURONIUM BROMIDE 100 MG/10ML IV SOLN
INTRAVENOUS | Status: DC | PRN
  Administered 2021-12-29: 20 mg via INTRAVENOUS

## 2021-12-29 MED ORDER — PROPOFOL 10 MG/ML IV BOLUS
INTRAVENOUS | Status: DC | PRN
  Administered 2021-12-29: 50 mg via INTRAVENOUS
  Administered 2021-12-29 (×2): 20 mg via INTRAVENOUS
  Administered 2021-12-29 (×2): 25 mg via INTRAVENOUS
  Administered 2021-12-29: 20 mg via INTRAVENOUS
  Administered 2021-12-29: 75 mg via INTRAVENOUS

## 2021-12-29 MED ORDER — MIDAZOLAM HCL 2 MG/2ML IJ SOLN
2.0000 mg | INTRAMUSCULAR | Status: DC | PRN
Start: 2021-12-29 — End: 2021-12-31

## 2021-12-29 MED ORDER — SODIUM CHLORIDE 0.9 % IV SOLN: INTRAVENOUS | Status: DC | PRN

## 2021-12-29 MED ORDER — PROPOFOL 10 MG/ML IV BOLUS
INTRAVENOUS | Status: DC | PRN
  Administered 2021-12-29: 50 mg via INTRAVENOUS

## 2021-12-29 MED ORDER — PROPOFOL 1000 MG/100ML IV EMUL
0.0000 ug/kg/min | INTRAVENOUS | Status: DC
  Administered 2021-12-29 (×2): 40 ug/kg/min via INTRAVENOUS
  Administered 2021-12-29 (×2): 50 ug/kg/min via INTRAVENOUS
  Administered 2021-12-30: 10 ug/kg/min via INTRAVENOUS
  Filled 2021-12-29 (×4): qty 100

## 2021-12-29 MED ORDER — INSULIN ASPART 100 UNIT/ML IJ SOLN
0.0000 [IU] | INTRAMUSCULAR | Status: DC
  Administered 2021-12-29: 4 [IU] via SUBCUTANEOUS
  Administered 2021-12-29 – 2021-12-30 (×3): 3 [IU] via SUBCUTANEOUS
  Administered 2021-12-30 – 2021-12-31 (×2): 4 [IU] via SUBCUTANEOUS
  Administered 2021-12-31 (×3): 7 [IU] via SUBCUTANEOUS
  Administered 2021-12-31 (×2): 4 [IU] via SUBCUTANEOUS
  Administered 2022-01-01 (×2): 7 [IU] via SUBCUTANEOUS
  Administered 2022-01-01 (×2): 4 [IU] via SUBCUTANEOUS
  Administered 2022-01-01: 7 [IU] via SUBCUTANEOUS
  Administered 2022-01-01: 4 [IU] via SUBCUTANEOUS
  Administered 2022-01-02: 15 [IU] via SUBCUTANEOUS
  Administered 2022-01-02 (×3): 7 [IU] via SUBCUTANEOUS
  Administered 2022-01-02: 15 [IU] via SUBCUTANEOUS
  Administered 2022-01-02: 11 [IU] via SUBCUTANEOUS
  Administered 2022-01-03: 7 [IU] via SUBCUTANEOUS
  Administered 2022-01-03 (×3): 4 [IU] via SUBCUTANEOUS
  Administered 2022-01-03: 11 [IU] via SUBCUTANEOUS
  Administered 2022-01-03 – 2022-01-04 (×2): 4 [IU] via SUBCUTANEOUS
  Administered 2022-01-04: 7 [IU] via SUBCUTANEOUS
  Administered 2022-01-04: 4 [IU] via SUBCUTANEOUS
  Administered 2022-01-04: 7 [IU] via SUBCUTANEOUS
  Administered 2022-01-04: 4 [IU] via SUBCUTANEOUS
  Administered 2022-01-04: 7 [IU] via SUBCUTANEOUS
  Administered 2022-01-05 (×2): 4 [IU] via SUBCUTANEOUS
  Administered 2022-01-05: 3 [IU] via SUBCUTANEOUS
  Administered 2022-01-05 (×2): 7 [IU] via SUBCUTANEOUS
  Administered 2022-01-05: 4 [IU] via SUBCUTANEOUS
  Administered 2022-01-06: 7 [IU] via SUBCUTANEOUS
  Administered 2022-01-06 (×2): 4 [IU] via SUBCUTANEOUS
  Administered 2022-01-06 – 2022-01-07 (×3): 7 [IU] via SUBCUTANEOUS
  Administered 2022-01-07 (×2): 4 [IU] via SUBCUTANEOUS
  Administered 2022-01-07 (×2): 3 [IU] via SUBCUTANEOUS
  Administered 2022-01-07 – 2022-01-08 (×3): 4 [IU] via SUBCUTANEOUS
  Administered 2022-01-08: 7 [IU] via SUBCUTANEOUS
  Administered 2022-01-08 – 2022-01-09 (×4): 4 [IU] via SUBCUTANEOUS
  Administered 2022-01-09 (×2): 7 [IU] via SUBCUTANEOUS
  Administered 2022-01-09: 4 [IU] via SUBCUTANEOUS
  Administered 2022-01-10 (×2): 3 [IU] via SUBCUTANEOUS
  Administered 2022-01-10: 4 [IU] via SUBCUTANEOUS
  Administered 2022-01-10 (×2): 3 [IU] via SUBCUTANEOUS
  Administered 2022-01-10 – 2022-01-11 (×2): 4 [IU] via SUBCUTANEOUS
  Administered 2022-01-11: 3 [IU] via SUBCUTANEOUS
  Administered 2022-01-11 – 2022-01-12 (×5): 4 [IU] via SUBCUTANEOUS
  Administered 2022-01-12 (×2): 3 [IU] via SUBCUTANEOUS
  Administered 2022-01-12 – 2022-01-13 (×2): 4 [IU] via SUBCUTANEOUS
  Administered 2022-01-13: 3 [IU] via SUBCUTANEOUS
  Administered 2022-01-13: 4 [IU] via SUBCUTANEOUS
  Administered 2022-01-13: 3 [IU] via SUBCUTANEOUS
  Administered 2022-01-13: 4 [IU] via SUBCUTANEOUS
  Administered 2022-01-13: 3 [IU] via SUBCUTANEOUS
  Administered 2022-01-14 (×2): 4 [IU] via SUBCUTANEOUS
  Administered 2022-01-14: 7 [IU] via SUBCUTANEOUS
  Filled 2021-12-29: qty 0.2

## 2021-12-29 MED ORDER — IOHEXOL 300 MG/ML  SOLN
100.0000 mL | Freq: Once | INTRAMUSCULAR | Status: AC | PRN
  Administered 2021-12-29: 55 mL via INTRAVENOUS

## 2021-12-29 MED ORDER — MIDAZOLAM HCL 5 MG/5ML IJ SOLN
INTRAMUSCULAR | Status: DC | PRN
  Administered 2021-12-29: 2 mg via INTRAVENOUS

## 2021-12-29 MED ORDER — ORAL CARE MOUTH RINSE
15.0000 mL | OROMUCOSAL | Status: DC
Start: 2021-12-29 — End: 2021-12-30
  Administered 2021-12-29 – 2021-12-30 (×10): 15 mL via OROMUCOSAL

## 2021-12-29 MED ORDER — SODIUM CHLORIDE 0.9 % IV SOLN
INTRAVENOUS | Status: AC
  Administered 2021-12-29: 2 g via INTRAVENOUS
  Filled 2021-12-29: qty 20

## 2021-12-29 MED ORDER — SODIUM CHLORIDE 0.9 % IV SOLN
50.0000 ug/h | INTRAVENOUS | Status: AC
  Administered 2021-12-29 – 2022-01-01 (×7): 50 ug/h via INTRAVENOUS
  Filled 2021-12-29 (×8): qty 1

## 2021-12-29 MED ORDER — VITAMIN K1 10 MG/ML IJ SOLN
5.0000 mg | Freq: Once | INTRAVENOUS | Status: AC
  Administered 2021-12-29: 5 mg via INTRAVENOUS
  Filled 2021-12-29: qty 0.5

## 2021-12-29 MED ORDER — FENTANYL CITRATE (PF) 100 MCG/2ML IJ SOLN
50.0000 ug | INTRAMUSCULAR | Status: DC | PRN
  Administered 2021-12-29 – 2021-12-30 (×3): 100 ug via INTRAVENOUS
  Filled 2021-12-29 (×4): qty 2

## 2021-12-29 MED ORDER — IOHEXOL 350 MG/ML SOLN
100.0000 mL | Freq: Once | INTRAVENOUS | Status: AC | PRN
  Administered 2021-12-29: 100 mL via INTRAVENOUS

## 2021-12-29 MED ORDER — PANTOPRAZOLE SODIUM 40 MG IV SOLR
40.0000 mg | Freq: Two times a day (BID) | INTRAVENOUS | Status: AC
  Administered 2022-01-01 – 2022-01-06 (×11): 40 mg via INTRAVENOUS
  Filled 2021-12-29 (×12): qty 10

## 2021-12-29 MED ORDER — LACTATED RINGERS IV SOLN: INTRAVENOUS | Status: DC | PRN

## 2021-12-29 MED ORDER — OCTREOTIDE LOAD VIA INFUSION
50.0000 ug | Freq: Once | INTRAVENOUS | Status: AC
  Administered 2021-12-29: 50 ug via INTRAVENOUS
  Filled 2021-12-29: qty 25

## 2021-12-29 MED ORDER — MIDAZOLAM HCL 2 MG/2ML IJ SOLN
INTRAMUSCULAR | Status: AC
  Filled 2021-12-29: qty 2

## 2021-12-29 MED ORDER — PANTOPRAZOLE 80MG IVPB - SIMPLE MED
80.0000 mg | Freq: Once | INTRAVENOUS | Status: AC
  Administered 2021-12-29: 80 mg via INTRAVENOUS
  Filled 2021-12-29: qty 80

## 2021-12-29 MED ORDER — SODIUM CHLORIDE 0.9 % IV SOLN
2.0000 g | INTRAVENOUS | Status: DC
  Administered 2021-12-30 – 2021-12-31 (×2): 2 g via INTRAVENOUS
  Filled 2021-12-29 (×2): qty 20

## 2021-12-29 MED ORDER — LABETALOL HCL 5 MG/ML IV SOLN
INTRAVENOUS | Status: DC | PRN
  Administered 2021-12-29: 5 mg via INTRAVENOUS

## 2021-12-29 MED ORDER — CHLORHEXIDINE GLUCONATE CLOTH 2 % EX PADS
6.0000 | MEDICATED_PAD | Freq: Every day | CUTANEOUS | Status: DC
  Administered 2021-12-30 – 2022-01-13 (×16): 6 via TOPICAL

## 2021-12-29 NOTE — Progress Notes (Signed)
[facility] arrived. Pt being transferred at this time
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
## 2021-12-29 NOTE — Progress Notes (Signed)
Pt was transported to CT via ventilator with no apparent complications. Pt is now back on 48M11 and is currently stable. 48M RT aware and will continue to monitor.

## 2021-12-29 NOTE — Progress Notes (Signed)
  Echocardiogram 2D Echocardiogram has been performed.  Chelsea [name] 12/25/2021, 4:39 PM

## 2021-12-29 NOTE — Progress Notes (Addendum)
Spoke with GI attending. EGD for surveillance of recently banded varices. Developed variceal bleeding during procedure. On re-look, clot adherent to prior sight of bleeding with diminished to resolved bleeding. Intubated emergently. BP stable, no pressors. Labs pending. Requested 2 large bore IVs be present prior to transport. Accepted patient as ICU admission to [facility]. IR has been contacted for consideration of emergent TIPS.

## 2021-12-29 NOTE — Progress Notes (Signed)
PCCM Interval Progress Note:   Patient arrived to [facility] from [facility]. PMHx of cirrhosis 2/2 NASH complicated by esophageal varices. Patient underwent EGD today for surveillance of recently banded varices. Patient was found to have grade 3 esophageal varices with multiple ulcers, likely post-banding. Bleeding subsequently slowed down due to adhered clot. She was emergently intubated and transferred to [facility] for consideration of emergent TIPS.   On arrival, patient's brother and sister are present at bedside. We discussed in depth the events that occurred today and both expressed understanding. Patient's sister is available to sign consent.   Patient is resting comfortably; sedated.   Vitals:     12/24/2021    3:55 PM 01/10/2022    3:00 PM 01/12/2022    2:00 PM  Vitals with BMI  Systolic [id number]  Diastolic 75 75 75  Pulse 70 63 70   Physical Examination:  General: Acutely ill appearing obese female; appears stated age. No acute distress.  HENT: Moist mucus membranes. ETT in place. No scleral icterus. PERRL.  Lungs: Clear to auscultation throughout. No vent dyssynchrony noted.  Cardiovascular: Irregularly irregular with normal rate. No murmurs, rubs or gallops.  Abdomen: Distended, tympanic abdomen. Bowel sounds hypoactive.  Extremities: No peripheral edema. Warm.  Neuro: Sedated.  GU: Deferred.  Assessment/Plan:   # Upper GI Bleed 2/2 Grade 3 Esophageal Varices w/ ulcerations  Hemoglobin stable at this time, however will repeat CBC. Contacted IR to inform of patient's arrival. Plan for TIPS today if TTE can be performed to evaluate cardiac function. Contacted echo tech; will have TTE shortly.   - IR consulted; appreciate their recommendations - Plan for TIPS tentatively today - Continue Octreotide and Pantoprazole gtt  - Continue Rocephin for SBP prophylaxis  - CBC pending - Repeat CBC q6h  - PT/INR pending - If INR is stable, plan to give Vitamin K. If elevated, will need to obtain  fibrinogen level  - Lactic acid pending - Continue [place] @ 50 cc/hr  # Acute Respiratory Failure # Chronic Right Pleural Effusion  # History of OSA # History of COPD  Secondary to above.   - Continue full ventilatory support - Begin SBT once stabilized from a GI point - VAP bundle  - Pain and agitation protocol with Propofol and Fentanyl - Albuterol PRN   # Iron Deficiency Anemia 2/2 GI Bleed - CBC q6h  - Transfuse for hemoglobin < 7 or hemodynamic instability  # Type 2 Diabetes Mellitus  Last a1c in May 2023 elevated at 8.9%.   - SSI q4h  - CBG q4h   # History of HFpEF  - Holding home Lasix and Cardizem  # Paroxysmal A. Fib - No indication for rate control - Anti-coagulation contraindicated due to bleed    Signed, Dr. Jose Persia Internal Medicine PGY-3  01/10/2022, 4:17 PM

## 2021-12-29 NOTE — Progress Notes (Addendum)
Patient underwent EGD today, was found to have grade 3 esophageal varices with some associated ulcers, likely post banding.  1 band was effectively deployed but a second band led to rupture of the varix.  Patient had to be emergently intubated.  Upon reevaluation of the area it was found that her bleeding had slowed down and she had a large associated clot.  I discussed the case with Dr. Phill Myron from interventional radiology and we both agree that the patient would likely require an emergent TIPS.  We will obtain labs right now and admit for transfer to [facility].  She will need to undergo a CT angio abdomen and pelvis BRTO protocol  I tried to reach the sister to discuss the patient's current medical situation but could not get in touch with her.  I left a detailed voice message.  Discussed with Dr. Silas Flood  from pulmonary critical care, who accepted the patient for transfer.

## 2021-12-29 NOTE — H&P (Signed)
NAME:  Chelsea [name], MRN:  [id number], DOB:  October 19, 1955, LOS: 0 ADMISSION DATE:  12/25/2021, CONSULTATION DATE:  12/29/2021 REFERRING MD:  Dr. 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  She had admission from 12/16/21 to 12/21/21 at [facility] for Upper GI bleeding.  She was on coumadin for hx of A fib.  She had EGD on 12/29/21 at [facility] for grade 3 varices.  She had difficulty with bleeding.  She required intubation for airway protection during the procedure.  GI d/w IR about TIPS.  Pt transferred to [facility] for further management.  Hx from chart and medical team.  Pertinent  Medical History  Iron deficiency anemia, DM type 2, A fib, NASH with cirrhosis, COPD, CHF, HLD, OSA, CVA, HTN  Significant Hospital Events: Including procedures, antibiotic start and stop dates in addition to other pertinent events   6/06 EGD with varix banding at [facility], intubated, start octreotide/protonix gtt, transfer to [facility] for IR to assess TIPS  Interim History / Subjective:    Objective   Blood pressure 125/65, pulse 73, temperature 97.6 F (36.4 C), resp. rate 17, height 5\' 7"  (1.702 m), weight 104.3 kg, SpO2 100 %.    Vent Mode: PRVC FiO2 (%):  [80 %-100 %] 80 % Set Rate:  [15 bmp] 15 bmp Vt Set:  [490 mL] 490 mL PEEP:  [5 cmH20] 5 cmH20 Plateau Pressure:  [15 cmH20] 15 cmH20   Intake/Output Summary (Last 24 hours) at 01/04/2022 1241 Last data filed at 01/16/2022 1145 Gross per 24 hour  Intake 900 ml  Output 175 ml  Net 725 ml   Filed Weights   12/26/2021 0853  Weight: 104.3 kg    Examination:  General - sedated Eyes - pupils reactive ENT - ETT in place Cardiac - regular, no murmur Chest - b/l rhonchi Abdomen - soft, non tender, decreased bowel sounds Extremities - no cyanosis, clubbing, or edema Skin - no rashes Neuro - RASS -3  Resolved Hospital Problem list     Assessment & Plan:   Upper  GI bleeding from esophageal varices in setting of NASH with cirrhosis. - continue octreotide, protonix gtt - IR to assess for TIPS - will need CT angio abd/pelvis when she gets to [facility] - day 1 of rocephin  Compromised airway. Hx of COPD, OSA. - goal SpO2 > 92% - f/u CXR, ABG - prn BDs  Chronic Rt pleural effusion. 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 - hold outpt cardizem, lasix, coumadin  Iron deficiency anemia in setting of Upper GI bleeding. - f/u CBC - transfuse for Hb < 7  DM type 2 poorly controlled with hyperglycemia. - SSI  Sedation. - RASS goal -1 to -2  Goals of care.   - DNR  Best Practice (right click and "Reselect all SmartList Selections" daily)   Diet/type: NPO DVT prophylaxis: SCD GI prophylaxis: PPI Lines: N/A Foley:  N/A Code Status:  DNR Last date of multidisciplinary goals of care discussion [x]   Labs   CBC: Recent Labs  Lab 12/28/21 1202 01/08/2022 1047  WBC 4.3 6.4  HGB 9.0* 9.6*  HCT 29.7* 31.8*  MCV 81.1 82.4  PLT 56* PLATELETS APPEAR DECREASED    Basic Metabolic  Panel: Recent Labs  Lab 12/28/21 1202 12/27/2021 1047  NA 139 140  K 3.6 4.1  CL 112* 111  CO2 23 25  GLUCOSE 200* 191*  BUN 10 8  CREATININE 0.79 0.67  CALCIUM 9.3 9.1   GFR: Estimated Creatinine Clearance: 88.3 mL/min (by C-G formula based on SCr of 0.67 mg/dL). Recent Labs  Lab 12/28/21 1202 12/28/2021 1047  WBC 4.3 6.4    Liver Function Tests: Recent Labs  Lab 12/27/2021 1047  AST 36  ALT 22  ALKPHOS 78  BILITOT 1.7*  PROT 7.4  ALBUMIN 2.4*   No results for input(s): LIPASE, AMYLASE in the last 168 hours. No results for input(s): AMMONIA in the last 168 hours.  ABG    Component Value Date/Time   HCO3 24.5 12/13/2020 1306   ACIDBASEDEF 0.4 12/13/2020 1306    O2SAT 98.5 12/13/2020 1306     Coagulation Profile: No results for input(s): INR, PROTIME in the last 168 hours.  Cardiac Enzymes: No results for input(s): CKTOTAL, CKMB, CKMBINDEX, TROPONINI in the last 168 hours.  HbA1C: Hgb A1c MFr Bld  Date/Time Value Ref Range Status  12/16/2021 03:17 PM 8.9 (H) 4.8 - 5.6 % Final    Comment:    (NOTE)         Prediabetes: 5.7 - 6.4         Diabetes: >6.4         Glycemic control for adults with diabetes: <7.0     CBG: Recent Labs  Lab 12/25/2021 0853 01/18/2022 1125  GLUCAP 184* 162*    Review of Systems:   Unable to obtain  Past Medical History:  She,  has a past medical history of Anemia, Atrial fibrillation ([place]), CHF (congestive heart failure) ([place]), Complication of anesthesia, COPD (chronic obstructive pulmonary disease) ([place]), Dysrhythmia, Family history of adverse reaction to anesthesia, Fatty liver, Head trauma (06/2010), Hyperlipidemia, Paroxysmal atrial fibrillation ([place]), Sleep apnea, Stroke ([place]) (2011), Type II or unspecified type diabetes mellitus without mention of complication, not stated as uncontrolled, and Unspecified essential hypertension.   Surgical History:   Past Surgical History:  Procedure Laterality Date   ABDOMINAL HYSTERECTOMY     BREAST REDUCTION SURGERY     CESAREAN SECTION     ESOPHAGEAL BANDING  01/02/2021   Procedure: ESOPHAGEAL BANDING;  Surgeon: Harvel Quale, MD;  [facility];  Service: Gastroenterology;;   ESOPHAGEAL BANDING  12/17/2021   Procedure: ESOPHAGEAL BANDING;  Surgeon: Daneil Dolin, MD;  [facility];  Service: Endoscopy;;   ESOPHAGOGASTRODUODENOSCOPY (EGD) WITH PROPOFOL N/A 06/27/2020   Procedure: ESOPHAGOGASTRODUODENOSCOPY (EGD) WITH PROPOFOL;  Surgeon: Harvel Quale, MD;  [facility];  Service: Gastroenterology;  Laterality: N/A;  7:30   ESOPHAGOGASTRODUODENOSCOPY (EGD) WITH PROPOFOL N/A 01/02/2021   Procedure:  ESOPHAGOGASTRODUODENOSCOPY (EGD) WITH PROPOFOL;  Surgeon: Harvel Quale, MD;  [facility];  Service: Gastroenterology;  Laterality: N/A;  10:15   ESOPHAGOGASTRODUODENOSCOPY (EGD) WITH PROPOFOL N/A 12/17/2021   Procedure: ESOPHAGOGASTRODUODENOSCOPY (EGD) WITH PROPOFOL;  Surgeon: Daneil Dolin, MD;  [facility];  Service: Endoscopy;  Laterality: N/A;   fibroid tumor removal     Left Knee   HOT HEMOSTASIS  06/27/2020   Procedure: HOT HEMOSTASIS (ARGON PLASMA COAGULATION/BICAP);  Surgeon: Montez Morita, Quillian Quince, MD;  [facility];  Service: Gastroenterology;;     Social History:   reports that she has never smoked. She has never used smokeless tobacco. She reports that she does not  drink alcohol and does not use drugs.   Family History:  Her family history includes Breast cancer in her sister and sister; Diabetes in her brother, brother, brother, brother, mother, and sister; Healthy in her brother and brother; Heart attack (age of onset: 16) in her father; Heart attack (age of onset: 67) in her brother; Heart disease in her brother and father; Hypertension in her sister; Lung cancer in her sister.   Allergies Allergies  Allergen Reactions   Bee Venom Anaphylaxis   Lisinopril Cough     Home Medications  Prior to Admission medications   Medication Sig Start Date End Date Taking? Authorizing Provider  diltiazem (CARDIZEM CD) 180 MG 24 hr capsule Take 1 capsule (180 mg total) by mouth daily. 12/21/21  Yes Johnson, Clanford L, MD  furosemide (LASIX) 40 MG tablet TAKE 1 TABLET BY MOUTH EVERY DAY AS NEEDED 07/10/19  Yes Branch, Alphonse Guild, MD  metFORMIN (GLUCOPHAGE) 1000 MG tablet Take 1,000 mg by mouth 2 (two) times daily with a meal.   Yes [provider]  pantoprazole (PROTONIX) 40 MG tablet Take 1 tablet (40 mg total) by mouth daily. 12/22/21  Yes Johnson, Clanford L, MD  vitamin B-12 (CYANOCOBALAMIN) 1000 MCG tablet Take 1,000 mcg by  mouth 3 (three) times a week. Patient not taking: Reported on 12/22/2021    [provider]  warfarin (COUMADIN) 6 MG tablet Take 6 mg by mouth every evening. Patient not taking: Reported on 12/22/2021 04/28/21   [provider]     Critical care time: 39 minutes  Chesley Mires, MD [place] Pager - [phone] 01/21/2022, 12:54 PM

## 2021-12-29 NOTE — Transfer of Care (Signed)
Immediate Anesthesia Transfer of Care Note  Patient: Chelsea [name]  Procedure(s) Performed: TIPS  Patient Location: ICU  Anesthesia Type:General  Level of Consciousness: Patient remains intubated per anesthesia plan  Airway & Oxygen Therapy: Patient remains intubated per anesthesia plan and Patient placed on Ventilator (see vital sign flow sheet for setting)  Post-op Assessment: Report given to RN and Post -op Vital signs reviewed and stable  Post vital signs: Reviewed and stable  Last Vitals:  Vitals Value Taken Time  BP 119/66 01/06/2022 2215  Temp    Pulse 82 12/24/2021 2220  Resp 18 01/07/2022 2220  SpO2 100 % 01/17/2022 2220  Vitals shown include unvalidated device data.  Last Pain:  Vitals:   12/28/2021 1937  TempSrc: Axillary  PainSc:       Patients Stated Pain Goal: 6 ([id number])  Complications: No notable events documented.

## 2021-12-29 NOTE — Op Note (Signed)
[facility] Patient Name: Chelsea [name] Procedure Date: 01/16/2022 10:02 AM MRN: [id number] Date of Birth: [birth year] Attending MD: [name] ,  CSN: [id number] Age: 66 Admit Type: Outpatient Procedure:                Upper GI endoscopy Indications:              Follow-up of esophageal varices, history of recent                            variceal bleeding Providers:                [name], Janeece Riggers, RN, Kristine L.                            Risa Grill, Technician Referring MD:              Medicines:                General Anesthesia (initially MAC) Complications:            No immediate complications. Estimated Blood Loss:     Moderate bleeding Procedure:                Pre-Anesthesia Assessment:                           - Prior to the procedure, a History and Physical                            was performed, and patient medications, allergies                            and sensitivities were reviewed. The patient's                            tolerance of previous anesthesia was reviewed.                           - The risks and benefits of the procedure and the                            sedation options and risks were discussed with the                            patient. All questions were answered and informed                            consent was obtained.                           - ASA Grade Assessment: III - A patient with severe                            systemic disease.                           After obtaining informed consent, the endoscope was  passed under direct vision. Throughout the                            procedure, the patient's blood pressure, pulse, and                            oxygen saturations were monitored continuously. The                            [id number]-H190 ([id number]) scope was introduced through the                            mouth, and advanced to the second part of duodenum.                             The patient tolerated the procedure well. The upper                            GI endoscopy was performed with difficulty due to                            excessive bleeding. Scope In: 10:16:16 AM Scope Out: 10:41:20 AM Total Procedure Duration: 0 hours 25 minutes 4 seconds  Findings:      Grade III varices were found in the mid esophagus and in the distal       esophagus. There were two ulcerations, possible post banding ulcers. Two       bands were placed but one of the bands broke and there was presence of       ongoing bleeding from the varix. After thorough lavage, the bleedin had       stopped at the end of the procedure and there was a large adhered clot       to the varix.      Mild portal hypertensive gastropathy was found in the entire examined       stomach.      The examined duodenum was normal.      Note: due to variceal bleeding, the patient had to be intubated. Case       discussed with Dr. Serafina Royals from IR, will arrange transfer for possible       TIPS      Spoke with Richardson Landry Minor, NP from critical care at [facility] to inform about       the case. Impression:               - Grade III esophageal varices. One varix ruptured,                            (one banded effectively). Bleeding slowed down but                            there was a large clot attached to it.                           - Portal hypertensive gastropathy.                           -  Normal examined duodenum.                           - No specimens collected. Moderate Sedation:      Per Anesthesia Care Recommendation:           - Admit the patient to ICU for ongoing care, will                            need to transfer to [facility] for possible                            emergent TIPS.                           - NPO.                           - Pantoprazole 40 mg BID IV                           - Octreotide drip                           - Check CBC, CMP, INR, trype and screen                            - Reach IR once patient is transferred to [facility]                           - STAT CT angio abdomen/pelvis with IV contrast -                            BRTO protocol. Procedure Code(s):        --- Professional ---                           [phone], Esophagogastroduodenoscopy, flexible,                            transoral; with band ligation of esophageal/gastric                            varices Diagnosis Code(s):        --- Professional ---                           I85.00, Esophageal varices without bleeding                           K76.6, Portal hypertension                           K31.89, Other diseases of stomach and duodenum CPT copyright 2019 American Medical Association. All rights reserved. The codes documented in this report are preliminary and upon coder review may  be revised to meet current compliance requirements. [name], MD [name],  01/01/2022 11:30:06 AM This report  has been signed electronically. Number of Addenda: 0

## 2021-12-29 NOTE — Anesthesia Preprocedure Evaluation (Signed)
Anesthesia Evaluation   Patient unresponsive    Reviewed: Allergy & Precautions, Patient's Chart, lab work & pertinent test results, Unable to perform ROS - Chart review onlyPreop documentation limited or incomplete due to emergent nature of procedure.  History of Anesthesia Complications Negative for: history of anesthetic complications  Airway Mallampati: Intubated       Dental   Pulmonary sleep apnea , COPD,       + intubated    Cardiovascular hypertension, +CHF  + dysrhythmias Atrial Fibrillation      Neuro/Psych CVA, No Residual Symptoms negative psych ROS   GI/Hepatic negative GI ROS, (+) Cirrhosis   Esophageal Varices and ascites    , Hepatitis -  Endo/Other  diabetes, Type 2, Oral Hypoglycemic Agents Obesity   Renal/GU negative Renal ROS     Musculoskeletal negative musculoskeletal ROS (+)   Abdominal   Peds  Hematology  (+) Blood dyscrasia, anemia ,  On coumadin, INR 1.7 Pancytopenia CBC      Component                Value               Date/Time                 WBC                      3.5 (L)             01/10/22 1506           HGB                      7.9 (L)             2022-01-10 1506           HCT                      26.0 (L)            January 10, 2022 1506           PLT                      51 (L)              01/10/22 1506             Anesthesia Other Findings   Reproductive/Obstetrics                             Anesthesia Physical Anesthesia Plan  ASA: 4 and emergent  Anesthesia Plan: General   Post-op Pain Management: Minimal or no pain anticipated   Induction: Inhalational  PONV Risk Score and Plan: 3 and Treatment may vary due to age or medical condition  Airway Management Planned: Oral ETT  Additional Equipment: Arterial line  Intra-op Plan:   Post-operative Plan: Post-operative intubation/ventilation  Informed Consent:     Only emergency  history available and History available from chart only  Plan Discussed with: CRNA and Anesthesiologist  Anesthesia Plan Comments:         Anesthesia Quick Evaluation

## 2021-12-29 NOTE — Anesthesia Procedure Notes (Signed)
Arterial Line Insertion Start/End15-Jun-2023 7:25 PM, 2022-01-07 7:40 PM Performed by: Adair Laundry, CRNA, CRNA  Patient location: ICU. Preanesthetic checklist: patient identified, IV checked, site marked and monitors and equipment checked Lidocaine 1% used for infiltration Right, radial was placed Catheter size: 20 G Hand hygiene performed  and maximum sterile barriers used   Attempts: 1 Procedure performed without using ultrasound guided technique. Following insertion, dressing applied. Post procedure assessment: normal  Patient tolerated the procedure well with no immediate complications.

## 2021-12-29 NOTE — Progress Notes (Signed)
I discussed with both Mrs. Chelsea [name] and the patient's brother about the patient's medical condition.  Both understood and agreed with the plan.

## 2021-12-29 NOTE — Progress Notes (Signed)
eLink Physician-Brief Progress Note Patient Name: Chelsea [name] DOB: 1955-11-30 MRN: [id number]   Date of Service  01/22/2022  HPI/Events of Note  Patient s/p TIPS with oliguria, originally admitted with GI bleeding.  eICU Interventions  Lasix 10 mg iv x 1 ordered.        Kerry Kass TRUE [place] 12/28/2021, 11:12 PM

## 2021-12-29 NOTE — Anesthesia Procedure Notes (Signed)
Procedure Name: Intubation Date/Time: 12/28/2021 10:32 AM Performed by: Ollen Bowl, CRNA Pre-anesthesia Checklist: Patient identified, Patient being monitored, Timeout performed, Emergency Drugs available and Suction available Patient Re-evaluated:Patient Re-evaluated prior to induction Oxygen Delivery Method: Circle system utilized Preoxygenation: Pre-oxygenation with 100% oxygen Induction Type: IV induction Ventilation: Mask ventilation without difficulty Laryngoscope Size: Mac and 3 Grade View: Grade I Tube type: Oral Tube size: 7.0 mm Number of attempts: 1 Airway Equipment and Method: Stylet Placement Confirmation: ETT inserted through vocal cords under direct vision, positive ETCO2 and breath sounds checked- equal and bilateral Secured at: 23 cm Tube secured with: Tape Dental Injury: Teeth and Oropharynx as per pre-operative assessment

## 2021-12-29 NOTE — Interval H&P Note (Signed)
History and Physical Interval Note:  12/28/2021 9:24 AM  Patient presented recent admission for recurrent variceal bleeding, was banded during last admission, now off warfarin.  Chelsea [name]  has presented today for surgery, with the diagnosis of Esophageal Varices s/p banding.  The various methods of treatment have been discussed with the patient and family. After consideration of risks, benefits and other options for treatment, the patient has consented to  Procedure(s) with comments: ESOPHAGOGASTRODUODENOSCOPY (EGD) WITH PROPOFOL (N/A) - 235 as a surgical intervention.  The patient's history has been reviewed, patient examined, no change in status, stable for surgery.  I have reviewed the patient's chart and labs.  Questions were answered to the patient's satisfaction.     [name]

## 2021-12-29 NOTE — Anesthesia Preprocedure Evaluation (Signed)
Anesthesia Evaluation  Patient identified by MRN, date of birth, ID band Patient awake    Reviewed: Allergy & Precautions, NPO status , Patient's Chart, lab work & pertinent test results  History of Anesthesia Complications (+) Family history of anesthesia reaction and history of anesthetic complications  Airway Mallampati: II  TM Distance: >3 FB Neck ROM: Full    Dental  (+) Dental Advisory Given, Missing   Pulmonary sleep apnea , COPD,    Pulmonary exam normal breath sounds clear to auscultation       Cardiovascular Exercise Tolerance: Poor hypertension, Pt. on medications +CHF  Normal cardiovascular exam+ dysrhythmias Atrial Fibrillation  Rhythm:Regular Rate:Normal     Neuro/Psych Subdural hematoma CVA negative psych ROS   GI/Hepatic negative GI ROS, (+) Cirrhosis   Esophageal Varices    , Hepatitis -  Endo/Other  diabetes, Well Controlled, Type 2, Oral Hypoglycemic Agents  Renal/GU negative Renal ROS  negative genitourinary   Musculoskeletal negative musculoskeletal ROS (+)   Abdominal   Peds negative pediatric ROS (+)  Hematology  (+) Blood dyscrasia, anemia ,   Anesthesia Other Findings   Reproductive/Obstetrics negative OB ROS                             Anesthesia Physical  Anesthesia Plan  ASA: 3  Anesthesia Plan: General   Post-op Pain Management: Minimal or no pain anticipated   Induction: Intravenous  PONV Risk Score and Plan: Propofol infusion  Airway Management Planned: Nasal Cannula and Natural Airway  Additional Equipment:   Intra-op Plan:   Post-operative Plan: Possible Post-op intubation/ventilation  Informed Consent: I have reviewed the patients History and Physical, chart, labs and discussed the procedure including the risks, benefits and alternatives for the proposed anesthesia with the patient or authorized representative who has indicated  his/her understanding and acceptance.     Dental advisory given  Plan Discussed with: CRNA and Surgeon  Anesthesia Plan Comments:         Anesthesia Quick Evaluation

## 2021-12-29 NOTE — Progress Notes (Signed)
RT Note:  Patient removed from mechanical ventilation and transported to IR by CRNA.

## 2021-12-29 NOTE — Procedures (Signed)
Interventional Radiology Procedure Note  Procedure:  1) TIPS creation 2) Left gastric vein coil embolization  Findings: Please refer to procedural dictation for full description.   Complications: None immediate  Estimated Blood Loss: 5 mL  Recommendations: No head of bed restrictions. Recommend am CBC, CMP, INR.  Expect transient transaminitis and hyperbilirubinemia after TIPS. Repeat echocardiogram tomorrow due to elevated right heart pressures. Recommend right thoracentesis tomorrow. IR will follow.   Marliss Coots, MD Pager: [phone] Clinic: [phone]

## 2021-12-29 NOTE — Transfer of Care (Signed)
Immediate Anesthesia Transfer of Care Note  Patient: Chelsea [name]  Procedure(s) Performed: ESOPHAGOGASTRODUODENOSCOPY (EGD) WITH PROPOFOL  Patient Location: PACU  Anesthesia Type:General  Level of Consciousness: sedated  Airway & Oxygen Therapy: Patient placed on Ventilator (see vital sign flow sheet for setting)  Post-op Assessment: Report given to RN  Post vital signs: Reviewed and stable  Last Vitals:  Vitals Value Taken Time  BP 120/75 Jan 14, 2022 1130  Temp    Pulse 81 2022/01/14 1134  Resp 16 Jan 14, 2022 1134  SpO2 100 % Jan 14, 2022 1134  Vitals shown include unvalidated device data.  Last Pain:  Vitals:   January 14, 2022 1058  TempSrc:   PainSc: 0-No pain      Patients Stated Pain Goal: 6 (January 14, 2022 0853)  Complications: No notable events documented.

## 2021-12-30 ENCOUNTER — Inpatient Hospital Stay (HOSPITAL_COMMUNITY): Payer: No Typology Code available for payment source

## 2021-12-30 ENCOUNTER — Encounter (HOSPITAL_COMMUNITY): Payer: Self-pay | Admitting: Interventional Radiology

## 2021-12-30 DIAGNOSIS — G9340 Encephalopathy, unspecified: Secondary | ICD-10-CM | POA: Diagnosis present

## 2021-12-30 DIAGNOSIS — I8500 Esophageal varices without bleeding: Secondary | ICD-10-CM | POA: Diagnosis not present

## 2021-12-30 DIAGNOSIS — J9601 Acute respiratory failure with hypoxia: Secondary | ICD-10-CM | POA: Diagnosis present

## 2021-12-30 DIAGNOSIS — J90 Pleural effusion, not elsewhere classified: Secondary | ICD-10-CM | POA: Diagnosis present

## 2021-12-30 LAB — TYPE AND SCREEN
ABO/RH(D): O POS
Antibody Screen: NEGATIVE
Unit division: 0
Unit division: 0

## 2021-12-30 LAB — CBC
HCT: 32.5 % — ABNORMAL LOW (ref 36.0–46.0)
HCT: 35.1 % — ABNORMAL LOW (ref 36.0–46.0)
HCT: 36.7 % (ref 36.0–46.0)
Hemoglobin: 10.7 g/dL — ABNORMAL LOW (ref 12.0–15.0)
Hemoglobin: 11.6 g/dL — ABNORMAL LOW (ref 12.0–15.0)
Hemoglobin: 11.8 g/dL — ABNORMAL LOW (ref 12.0–15.0)
MCH: 26.4 pg (ref 26.0–34.0)
MCH: 26.1 pg (ref 26.0–34.0)
MCH: 25.8 pg — ABNORMAL LOW (ref 26.0–34.0)
MCHC: 32.9 g/dL (ref 30.0–36.0)
MCHC: 33 g/dL (ref 30.0–36.0)
MCHC: 32.2 g/dL (ref 30.0–36.0)
MCV: 80 fL (ref 80.0–100.0)
MCV: 78.9 fL — ABNORMAL LOW (ref 80.0–100.0)
MCV: 80.3 fL (ref 80.0–100.0)
Platelets: 57 10*3/uL — ABNORMAL LOW (ref 150–400)
Platelets: 66 10*3/uL — ABNORMAL LOW (ref 150–400)
Platelets: 75 10*3/uL — ABNORMAL LOW (ref 150–400)
RBC: 4.06 MIL/uL (ref 3.87–5.11)
RBC: 4.45 MIL/uL (ref 3.87–5.11)
RBC: 4.57 MIL/uL (ref 3.87–5.11)
RDW: 22.5 % — ABNORMAL HIGH (ref 11.5–15.5)
RDW: 22.9 % — ABNORMAL HIGH (ref 11.5–15.5)
RDW: 23.5 % — ABNORMAL HIGH (ref 11.5–15.5)
WBC: 7.2 10*3/uL (ref 4.0–10.5)
WBC: 8.6 10*3/uL (ref 4.0–10.5)
WBC: 9.3 10*3/uL (ref 4.0–10.5)
nRBC: 0 % (ref 0.0–0.2)
nRBC: 0 % (ref 0.0–0.2)
nRBC: 0 % (ref 0.0–0.2)

## 2021-12-30 LAB — GLUCOSE, CAPILLARY
Glucose-Capillary: 108 mg/dL — ABNORMAL HIGH (ref 70–99)
Glucose-Capillary: 128 mg/dL — ABNORMAL HIGH (ref 70–99)
Glucose-Capillary: 130 mg/dL — ABNORMAL HIGH (ref 70–99)
Glucose-Capillary: 135 mg/dL — ABNORMAL HIGH (ref 70–99)
Glucose-Capillary: 138 mg/dL — ABNORMAL HIGH (ref 70–99)
Glucose-Capillary: 160 mg/dL — ABNORMAL HIGH (ref 70–99)

## 2021-12-30 LAB — COMPREHENSIVE METABOLIC PANEL
ALT: 21 U/L (ref 0–44)
AST: 38 U/L (ref 15–41)
Albumin: 2.1 g/dL — ABNORMAL LOW (ref 3.5–5.0)
Alkaline Phosphatase: 72 U/L (ref 38–126)
Anion gap: 8 (ref 5–15)
BUN: 6 mg/dL — ABNORMAL LOW (ref 8–23)
CO2: 21 mmol/L — ABNORMAL LOW (ref 22–32)
Calcium: 8.8 mg/dL — ABNORMAL LOW (ref 8.9–10.3)
Chloride: 110 mmol/L (ref 98–111)
Creatinine, Ser: 0.98 mg/dL (ref 0.44–1.00)
GFR, Estimated: >60 mL/min (ref >60)
Glucose, Bld: 142 mg/dL — ABNORMAL HIGH (ref 70–99)
Potassium: 3.9 mmol/L (ref 3.5–5.1)
Sodium: 139 mmol/L (ref 135–145)
Total Bilirubin: 3.8 mg/dL — ABNORMAL HIGH (ref 0.3–1.2)
Total Protein: 6.9 g/dL (ref 6.5–8.1)

## 2021-12-30 LAB — BPAM RBC
Blood Product Expiration Date: 202306262359
Blood Product Expiration Date: 202306262359
ISSUE DATE / TIME: 202306062017
ISSUE DATE / TIME: 202306062017
Unit Type and Rh: 5100
Unit Type and Rh: 5100

## 2021-12-30 LAB — LACTIC ACID, PLASMA
Lactic Acid, Venous: 2.8 mmol/L (ref 0.5–1.9)
Lactic Acid, Venous: 2.7 mmol/L (ref 0.5–1.9)

## 2021-12-30 LAB — BASIC METABOLIC PANEL
Anion gap: 11 (ref 5–15)
BUN: 9 mg/dL (ref 8–23)
CO2: 21 mmol/L — ABNORMAL LOW (ref 22–32)
Calcium: 9.1 mg/dL (ref 8.9–10.3)
Chloride: 110 mmol/L (ref 98–111)
Creatinine, Ser: 1.07 mg/dL — ABNORMAL HIGH (ref 0.44–1.00)
GFR, Estimated: 58 mL/min — ABNORMAL LOW (ref >60)
Glucose, Bld: 156 mg/dL — ABNORMAL HIGH (ref 70–99)
Potassium: 3.7 mmol/L (ref 3.5–5.1)
Sodium: 142 mmol/L (ref 135–145)

## 2021-12-30 LAB — MAGNESIUM
Magnesium: 1.4 mg/dL — ABNORMAL LOW (ref 1.7–2.4)
Magnesium: 1.9 mg/dL (ref 1.7–2.4)

## 2021-12-30 LAB — PROTIME-INR
INR: 1.7 — ABNORMAL HIGH (ref 0.8–1.2)
Prothrombin Time: 19.7 seconds — ABNORMAL HIGH (ref 11.4–15.2)

## 2021-12-30 LAB — AMMONIA: Ammonia: 64 umol/L — ABNORMAL HIGH (ref 9–35)

## 2021-12-30 LAB — TRIGLYCERIDES: Triglycerides: 57 mg/dL (ref <150)

## 2021-12-30 LAB — BLOOD PRODUCT ORDER (VERBAL) VERIFICATION

## 2021-12-30 LAB — PHOSPHORUS: Phosphorus: 3.9 mg/dL (ref 2.5–4.6)

## 2021-12-30 MED ORDER — FUROSEMIDE 10 MG/ML IJ SOLN
20.0000 mg | Freq: Once | INTRAMUSCULAR | Status: AC
  Administered 2021-12-30: 20 mg via INTRAVENOUS
  Filled 2021-12-30: qty 2

## 2021-12-30 MED ORDER — LACTULOSE ENEMA
300.0000 mL | Freq: Once | ORAL | Status: AC
  Administered 2021-12-30: 300 mL via RECTAL
  Filled 2021-12-30: qty 300

## 2021-12-30 MED ORDER — LACTULOSE 10 GM/15ML PO SOLN
10.0000 g | Freq: Three times a day (TID) | ORAL | Status: DC
  Administered 2021-12-31: 10 g via ORAL
  Filled 2021-12-30 (×2): qty 15

## 2021-12-30 MED ORDER — DILTIAZEM HCL ER 60 MG PO CP12
60.0000 mg | ORAL_CAPSULE | Freq: Two times a day (BID) | ORAL | Status: DC
  Filled 2021-12-30 (×2): qty 1

## 2021-12-30 MED ORDER — HYDRALAZINE HCL 20 MG/ML IJ SOLN
5.0000 mg | INTRAMUSCULAR | Status: DC | PRN
  Administered 2021-12-30: 5 mg via INTRAVENOUS
  Filled 2021-12-30 (×2): qty 1

## 2021-12-30 MED ORDER — HYDRALAZINE HCL 20 MG/ML IJ SOLN
10.0000 mg | Freq: Four times a day (QID) | INTRAMUSCULAR | Status: DC | PRN
  Administered 2021-12-30 – 2022-01-02 (×7): 10 mg via INTRAVENOUS
  Filled 2021-12-30: qty 0.5
  Filled 2021-12-30 (×8): qty 1

## 2021-12-30 MED ORDER — ALBUMIN HUMAN 25 % IV SOLN
12.5000 g | Freq: Once | INTRAVENOUS | Status: AC
  Administered 2021-12-30: 12.5 g via INTRAVENOUS
  Filled 2021-12-30: qty 50

## 2021-12-30 MED ORDER — DILTIAZEM HCL-DEXTROSE 125-5 MG/125ML-% IV SOLN (PREMIX)
5.0000 mg/h | INTRAVENOUS | Status: DC
  Administered 2021-12-30: 5 mg/h via INTRAVENOUS
  Administered 2021-12-31 – 2022-01-01 (×3): 15 mg/h via INTRAVENOUS
  Filled 2021-12-30 (×5): qty 125

## 2021-12-30 MED ORDER — MAGNESIUM SULFATE 4 GM/100ML IV SOLN
4.0000 g | Freq: Once | INTRAVENOUS | Status: AC
  Administered 2021-12-30: 4 g via INTRAVENOUS
  Filled 2021-12-30: qty 100

## 2021-12-30 MED ORDER — MAGNESIUM SULFATE IN D5W 1-5 GM/100ML-% IV SOLN
1.0000 g | Freq: Once | INTRAVENOUS | Status: AC
  Administered 2021-12-30: 1 g via INTRAVENOUS
  Filled 2021-12-30: qty 100

## 2021-12-30 MED ORDER — DILTIAZEM HCL 60 MG PO TABS: 60.0000 mg | ORAL_TABLET | Freq: Two times a day (BID) | ORAL | Status: DC

## 2021-12-30 MED ORDER — POTASSIUM CHLORIDE 10 MEQ/100ML IV SOLN
10.0000 meq | INTRAVENOUS | Status: AC
  Administered 2021-12-30 (×3): 10 meq via INTRAVENOUS
  Filled 2021-12-30: qty 100

## 2021-12-30 NOTE — Progress Notes (Signed)
[place] Progress Note Patient Name: Chelsea [name] DOB: 01/24/1956 MRN: [id number]   Date of Service  12/30/2021  HPI/Events of Note  Hypokalemia  Hypomagnesemia - K+ = 3.7, Mg++ - 1.9 and Creatinine = 1.09.  eICU Interventions  Will replace K+ and Mg++.     Intervention Category Major Interventions: Electrolyte abnormality - evaluation and management  Miranda Frese Eugene 12/30/2021, 8:43 PM

## 2021-12-30 NOTE — Progress Notes (Signed)
eLink Physician-Brief Progress Note Patient Name: Chelsea [name] DOB: Feb 24, 1956 MRN: [id number]   Date of Service  12/30/2021  HPI/Events of Note  Ammonia = 64. Patient has Lactulose 10 gm PO TID. However, patient is NPO.   eICU Interventions  Plan: Lactulose enema 200 gm per rectum X 1.      Intervention Category Major Interventions: Other:  Lenell Antu 12/30/2021, 8:52 PM

## 2021-12-30 NOTE — Procedures (Signed)
Extubation Procedure Note  Patient Details:   Name: Chelsea [name] DOB: 12-17-1955 MRN: [id number]   Airway Documentation:    Vent end date: 12/30/21 Vent end time: 1210   Evaluation  O2 sats: stable throughout Complications: No apparent complications Patient did tolerate procedure well. Bilateral Breath Sounds: Diminished, Clear   Yes  Pt extubated per physician order. Pt suctioned via ETT and orally prior, positive cuff leak heard. Upon extubation pt able to speak name, give a good cough and no stridor heard at this time. Pt placed on 3L nasal cannula. RT will continue to monitor and be available as needed.   Derinda Late 12/30/2021, 12:15 PM

## 2021-12-30 NOTE — Progress Notes (Signed)
eLink Physician-Brief Progress Note Patient Name: Chelsea [name] DOB: 12-28-1955 MRN: [id number]   Date of Service  12/30/2021  HPI/Events of Note    eICU Interventions  Lactic acid ordered.        Thomasene Lot Marya Lowden 12/30/2021, 6:36 AM

## 2021-12-30 NOTE — Progress Notes (Addendum)
eLink Physician-Brief Progress Note Patient Name: Chelsea [name] DOB: 1955/10/12 MRN: [id number]   Date of Service  12/30/2021  HPI/Events of Note  AFIB with RVR - Currently on a Diltiazem IV infusion at 15 mg/hour. Ventricular rate now = 105. BP = 166/60.  eICU Interventions  Plan: BMP and Mg++ level STAT.     Intervention Category Major Interventions: Arrhythmia - evaluation and management  Yashika Mask Eugene 12/30/2021, 7:42 PM

## 2021-12-30 NOTE — Progress Notes (Signed)
eLink Physician-Brief Progress Note Patient Name: Chelsea [name] DOB: 08-Dec-1955 MRN: [id number]   Date of Service  12/30/2021  HPI/Events of Note  Lactic acid increased from 2.1 to 2.8.  eICU Interventions  25 % albumin 12.5 gm iv x 1 ordered.        Thomasene Lot Mirjana Tarleton 12/30/2021, 3:53 AM

## 2021-12-30 NOTE — Progress Notes (Signed)
Pt arrived from IR. Report received from [facility], Scientist, clinical (histocompatibility and immunogenetics). Pt remains sedated and VSS.

## 2021-12-30 NOTE — Anesthesia Postprocedure Evaluation (Signed)
Anesthesia Post Note  Patient: Chelsea [name]  Procedure(s) Performed: ESOPHAGOGASTRODUODENOSCOPY (EGD) WITH PROPOFOL  Patient location during evaluation: Phase II Anesthesia Type: General Level of consciousness: awake Pain management: pain level controlled Vital Signs Assessment: post-procedure vital signs reviewed and stable Respiratory status: spontaneous breathing and respiratory function stable Cardiovascular status: blood pressure returned to baseline and stable Postop Assessment: no headache and no apparent nausea or vomiting Anesthetic complications: no Comments: Late entry   No notable events documented.   Last Vitals:  Vitals:   12/30/21 1210 12/30/21 1216  BP:    Pulse:    Resp:    Temp:  36.5 C  SpO2: 98%     Last Pain:  Vitals:   12/30/21 1216  TempSrc: Oral  PainSc:                  [name]

## 2021-12-30 NOTE — Progress Notes (Signed)
NAME:  JOMAIRA DEMIAN, MRN:  [id number], DOB:  1956/01/28, LOS: 1 ADMISSION DATE:  01/17/2022, CONSULTATION DATE:  12/29/2021 REFERRING MD:  Dr. 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  She had admission from 12/16/21 to 12/21/21 at [facility] for Upper GI bleeding.  She was on coumadin for hx of A fib.  She had EGD on 12/29/21 at [facility] for grade 3 varices.  She had difficulty with bleeding.  She required intubation for airway protection during the procedure.  GI d/w IR about TIPS.  Pt transferred to [facility] for further management.  Hx from chart and medical team.  6/6 tx to [facility], underwent TIPS. Tx back to ICU on vent. No further bleeding.  Pertinent  Medical History  Iron deficiency anemia, DM type 2, A fib, NASH with cirrhosis, COPD, CHF, HLD, OSA, CVA, HTN  Significant Hospital Events: Including procedures, antibiotic start and stop dates in addition to other pertinent events   6/06 EGD with varix banding at [facility], intubated, start octreotide/protonix gtt, transfer to [facility] for TIPS  Interim History / Subjective:  Successful TIPS. Vitals stable this AM. No bleeding.  Objective   Blood pressure (!) 145/84, pulse 90, temperature (!) 97.4 F (36.3 C), temperature source Oral, resp. rate 18, height 5\' 7"  (1.702 m), weight 104.3 kg, SpO2 100 %.    Vent Mode: PRVC FiO2 (%):  [40 %-100 %] 40 % Set Rate:  [15 bmp-18 bmp] 18 bmp Vt Set:  [490 mL] 490 mL PEEP:  [5 cmH20] 5 cmH20 Plateau Pressure:  [15 cmH20-28 cmH20] 23 cmH20   Intake/Output Summary (Last 24 hours) at 12/30/2021 0751 Last data filed at 12/30/2021 0600 Gross per 24 hour  Intake 3041.57 ml  Output 2825 ml  Net 216.57 ml    Filed Weights   12/28/2021 0853  Weight: 104.3 kg    Examination: General: Adult female, resting in bed, in NAD. Neuro: Sedated, minimally responsive but opens eyes to noxious stimuli HEENT: [place]/AT. Sclerae  anicteric. ETT in place. Cardiovascular: IRIR, no M/R/G.  Lungs: Respirations even and unlabored.  CTA bilaterally, No W/R/R. Abdomen: BS x 4, soft, NT/ND.  Musculoskeletal: No gross deformities, no edema.  Skin: Intact, warm, no rashes.   Assessment & Plan:   Upper GI bleeding from esophageal varices in setting of NASH with cirrhosis - s/p TIPS 01/03/2022. - IR following, appreciate the assistance - continue octreotide, protonix gtt - day 2 of rocephin  Respiratory insufficiency - s/p intubation for TIPS procedure above. Hx of COPD, OSA. - Full vent support - Wean as mental status allows - Bronchial hygiene - Continue BDs - Follow CXR intermittently  Chronic Rt pleural effusion - present on imaging studies back to May 2022. ? Hepatic hydrothorax - Consider diagnostic thoracentesis at some point  Hx of PAF, HTN, chronic diastolic CHF. - hold outpt cardizem, lasix, coumadin  Hypomagnesemia - s/p repletion. - Follow BMP.  Iron deficiency anemia in setting of Upper GI bleeding. - transfuse for Hb < 7  DM type 2 poorly controlled with hyperglycemia. - SSI - Hold home Metformin  Goals of care.   - DNR  Best Practice (right click and "Reselect all SmartList Selections" daily)   Diet/type: NPO DVT prophylaxis: SCD GI prophylaxis: PPI Lines: N/A Foley:  N/A Code Status:  DNR Last date of multidisciplinary goals of care discussion [x]   CC time: 35 minutes  Montey Hora, [facility] For pager details, please see AMION or use Epic chat  After 1900, please call [place] for cross coverage needs 12/30/2021, 8:03 AM

## 2021-12-30 NOTE — Progress Notes (Signed)
Patient back from IR. Placed back on mechanical ventilation via CRNA ,previous settings. Settings/alarms verified. RN at bedside.
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
## 2021-12-30 NOTE — Progress Notes (Signed)
Referring Physician(s) Montez Morita, Quillian Quince, MD   Supervising Physician: Corrie Mckusick  Patient Status:  [facility] - In-pt  Chief Complaint: Follow up emergent TIPS placement 12/31/2021 in IR (Dr. Serafina Royals)  Subjective:  Patient remains ventilated, per RN weaning sedation. Patient's sister present during exam who states patient will turn her head and open eyes briefly when she calls her by her nickname ("Chelsea [name]"). No acute concerns noted by sister or staff.  Allergies: Bee venom and Lisinopril  Medications: Prior to Admission medications   Medication Sig Start Date End Date Taking? Authorizing Provider  diltiazem (CARDIZEM CD) 180 MG 24 hr capsule Take 1 capsule (180 mg total) by mouth daily. 12/21/21  Yes Johnson, Clanford L, MD  furosemide (LASIX) 40 MG tablet TAKE 1 TABLET BY MOUTH EVERY DAY AS NEEDED 07/10/19  Yes Branch, Alphonse Guild, MD  metFORMIN (GLUCOPHAGE) 1000 MG tablet Take 1,000 mg by mouth 2 (two) times daily with a meal.   Yes [provider]  pantoprazole (PROTONIX) 40 MG tablet Take 1 tablet (40 mg total) by mouth daily. 12/22/21  Yes Johnson, Clanford L, MD  vitamin B-12 (CYANOCOBALAMIN) 1000 MCG tablet Take 1,000 mcg by mouth 3 (three) times a week. Patient not taking: Reported on 12/22/2021    [provider]  warfarin (COUMADIN) 6 MG tablet Take 6 mg by mouth every evening. Patient not taking: Reported on 12/22/2021 04/28/21   [provider]     Vital Signs: BP (!) 158/78   Pulse 89   Temp (!) 97.4 F (36.3 C) (Oral)   Resp 18   Ht 5\' 7"  (1.702 m)   Wt 230 lb (104.3 kg)   SpO2 100%   BMI 36.02 kg/m   Physical Exam Vitals and nursing note reviewed.  Constitutional:      Comments: Ventilated, weaning sedation. Agitated when removing dressings.  Cardiovascular:     Rate and Rhythm: Normal rate and regular rhythm.     Comments: (+) Right IJ puncture site clean, dry, dressed appropriately. Soft. (+) Right CFV puncture site clean, dry,  dressed appropriately. Soft. Pulmonary:     Effort: Pulmonary effort is normal.     Breath sounds: Normal breath sounds.  Abdominal:     Palpations: Abdomen is soft.  Skin:    Coloration: Skin is not jaundiced.    Imaging: DG Chest Port 1 View  Result Date: 12/30/2021 CLINICAL DATA:  Her story failure EXAM: PORTABLE CHEST 1 VIEW COMPARISON:  CT chest 12/13/2020 FINDINGS: Endotracheal tube with the tip 5.3 cm above the carina. Bilateral interstitial and patchy alveolar airspace opacities, right greater than left. Small bilateral pleural effusions. No pneumothorax. Stable cardiomegaly. No acute osseous abnormality. IMPRESSION: 1. Endotracheal tube with the tip 5.3 cm above the carina. 2. Bilateral interstitial and patchy alveolar airspace opacities, right greater than left, and small bilateral pleural effusions. Differential considerations include pulmonary edema versus multilobar pneumonia. Electronically Signed   By: Kathreen Devoid M.D.   On: 12/30/2021 07:29   DG Chest Port 1 View  Result Date: 01/12/2022 CLINICAL DATA:  Intubation.  COPD. EXAM: PORTABLE CHEST 1 VIEW COMPARISON:  12/13/2020 FINDINGS: Endotracheal tube tip is 1.3 cm above the carina. Bibasilar opacities with obscuration of the diaphragms. Blunted right costophrenic angle favoring at least a moderate right pleural effusion. Cannot exclude left pleural effusion although the left basilar appearance could be from atelectasis or pneumonia. The patient is rotated to the left on today's radiograph, reducing diagnostic sensitivity and specificity. Thoracic spondylosis. Probable  mild cardiomegaly. Atherosclerotic calcification of the aortic arch. IMPRESSION: 1. At least moderate-sized right pleural effusion. Left basilar airspace opacity could be from atelectasis, pneumonia, or left effusion. 2. Suspected mild cardiomegaly although cardiac contours difficult to discern due to leftward rotation. 3. Endotracheal tube tip is 1.3 cm above the  carina. Although not malpositioned, consider retracting 1 cm for security. Electronically Signed   By: Van Clines M.D.   On: 01/09/2022 11:53   ECHOCARDIOGRAM LIMITED  Result Date: 12/25/2021    ECHOCARDIOGRAM LIMITED REPORT   Patient Name:   Chelsea [name] Date of Exam: 01/18/2022 Medical Rec #:  [id number]         Height:       67.0 in Accession #:    [id number]        Weight:       230.0 lb Date of Birth:  07-02-1956         BSA:          2.146 m Patient Age:    66 years          BP:           124/75 mmHg Patient Gender: F                 HR:           78 bpm. Exam Location:  Inpatient Procedure: Limited Echo, Cardiac Doppler and Color Doppler                     STAT ECHO Reported to: Dr Marry Guan on 01/02/2022 4:37:00 PM. Indications:    Abnormal ECG  History:        Patient has prior history of Echocardiogram examinations, most                 recent 11/03/2011. CHF, Arrythmias:Atrial Fibrillation; Risk                 Factors:Diabetes, Hypertension, Sleep Apnea and Dyslipidemia.                 Cirrhosis.  Sonographer:    Clayton Lefort RDCS (AE) Referring Phys: [phone] MATTHEW R HUNSUCKER  Sonographer Comments: Echo performed with patient supine and on artificial respirator. IMPRESSIONS  1. The left ventricle has no regional wall motion abnormalities. Left ventricular diastolic parameters are consistent with Grade II diastolic dysfunction (pseudonormalization).  2. Right ventricular systolic function is normal. The right ventricular size is normal. Tricuspid regurgitation signal is inadequate for assessing PA pressure.  3. Left atrial size was mildly dilated.  4. The mitral valve is grossly normal. Trivial mitral valve regurgitation. No evidence of mitral stenosis.  5. The aortic valve is grossly normal. Aortic valve regurgitation is not visualized. No aortic stenosis is present. FINDINGS  Left Ventricle: The left ventricle has no regional wall motion abnormalities. The left ventricular internal cavity  size was normal in size. There is no left ventricular hypertrophy. Left ventricular diastolic parameters are consistent with Grade II diastolic dysfunction (pseudonormalization). Right Ventricle: The right ventricular size is normal. No increase in right ventricular wall thickness. Right ventricular systolic function is normal. Tricuspid regurgitation signal is inadequate for assessing PA pressure. Left Atrium: Left atrial size was mildly dilated. Right Atrium: Right atrial size was normal in size. Pericardium: Trivial pericardial effusion is present. Mitral Valve: The mitral valve is grossly normal. Mild mitral annular calcification. Trivial mitral valve regurgitation. No evidence of mitral valve stenosis. Tricuspid Valve: The tricuspid valve  is grossly normal. Tricuspid valve regurgitation is trivial. No evidence of tricuspid stenosis. Aortic Valve: The aortic valve is grossly normal. Aortic valve regurgitation is not visualized. No aortic stenosis is present. Aortic valve mean gradient measures 6.0 mmHg. Aortic valve peak gradient measures 11.7 mmHg. Aortic valve area, by VTI measures  1.73 cm. Pulmonic Valve: The pulmonic valve was grossly normal. Pulmonic valve regurgitation is not visualized. No evidence of pulmonic stenosis. Aorta: The aortic root and ascending aorta are structurally normal, with no evidence of dilitation. Venous: IVC assessment for right atrial pressure unable to be performed due to mechanical ventilation. IAS/Shunts: The atrial septum is grossly normal. Additional Comments: There is a small pleural effusion in the left lateral region. LEFT VENTRICLE PLAX 2D LVIDd:         4.80 cm   Diastology LVIDs:         3.00 cm   LV e' medial:    5.77 cm/s LV PW:         1.10 cm   LV E/e' medial:  20.3 LV IVS:        1.10 cm   LV e' lateral:   5.11 cm/s LVOT diam:     1.80 cm   LV E/e' lateral: 22.9 LV SV:         68 LV SV Index:   32 LVOT Area:     2.54 cm  IVC IVC diam: 2.40 cm LEFT ATRIUM          Index LA diam:    4.40 cm 2.05 cm/m  AORTIC VALVE AV Area (Vmax):    1.44 cm AV Area (Vmean):   1.50 cm AV Area (VTI):     1.73 cm AV Vmax:           171.00 cm/s AV Vmean:          118.000 cm/s AV VTI:            0.394 m AV Peak Grad:      11.7 mmHg AV Mean Grad:      6.0 mmHg LVOT Vmax:         96.70 cm/s LVOT Vmean:        69.700 cm/s LVOT VTI:          0.268 m LVOT/AV VTI ratio: 0.68  AORTA Ao Root diam: 3.00 cm Ao Asc diam:  3.40 cm MITRAL VALVE MV Area (PHT): 2.86 cm     SHUNTS MV Decel Time: 265 msec     Systemic VTI:  0.27 m MV E velocity: 117.00 cm/s  Systemic Diam: 1.80 cm MV A velocity: 100.00 cm/s MV E/A ratio:  1.17 Eleonore Chiquito MD Electronically signed by Eleonore Chiquito MD Signature Date/Time: 01/11/2022/5:49:02 PM    Final    CT ANGIO GI BLEED  Result Date: 01/18/2022 CLINICAL DATA:  Bleeding esophageal varices. EXAM: CTA ABDOMEN AND PELVIS WITHOUT AND WITH CONTRAST TECHNIQUE: Multidetector CT imaging of the abdomen and pelvis was performed using the standard protocol during bolus administration of intravenous contrast. Multiplanar reconstructed images and MIPs were obtained and reviewed to evaluate the vascular anatomy. RADIATION DOSE REDUCTION: This exam was performed according to the departmental dose-optimization program which includes automated exposure control, adjustment of the mA and/or kV according to patient size and/or use of iterative reconstruction technique. CONTRAST:  180mL OMNIPAQUE IOHEXOL 350 MG/ML SOLN COMPARISON:  CT chest dated Dec 13, 2020. CT abdomen pelvis dated October 09, 2017. FINDINGS: VASCULAR Aorta: Normal caliber aorta without aneurysm, dissection,  vasculitis or significant stenosis. Mild-to-moderate atherosclerotic calcification. Celiac: Patent without evidence of aneurysm, dissection, vasculitis or significant stenosis. SMA: Patent without evidence of aneurysm, dissection, vasculitis or significant stenosis. Renals: Both renal arteries are patent without evidence of  aneurysm, dissection, vasculitis, fibromuscular dysplasia or significant stenosis. IMA: Patent without evidence of aneurysm, dissection, vasculitis or significant stenosis. Inflow: Patent without evidence of aneurysm, dissection, vasculitis or significant stenosis. Proximal Outflow: Bilateral common femoral and visualized portions of the superficial and profunda femoral arteries are patent without evidence of aneurysm, dissection, vasculitis or significant stenosis. Veins: Patent portal system. Large splenorenal shunt noted. Gastric cardia and paraesophageal varices. Review of the MIP images confirms the above findings. NON-VASCULAR Lower chest: Increased moderate right and new small left pleural effusions. Complete collapse of the visualized left lower lobe. Partial collapse of the right lower lobe. Hepatobiliary: Unchanged nodular liver contour. No focal liver abnormality. Layering sludge within the gallbladder. No gallbladder wall thickening or biliary dilatation. Pancreas: Unremarkable. No pancreatic ductal dilatation or surrounding inflammatory changes. Spleen: Unchanged moderate splenomegaly.  No focal abnormality. Adrenals/Urinary Tract: Adrenal glands are unremarkable. Small right renal simple cyst inferiorly. No follow-up imaging is recommended. No renal calculi or hydronephrosis. Bladder is decompressed by Foley catheter. Stomach/Bowel: Stomach is within normal limits. Diminutive or absent appendix. No evidence of bowel wall thickening, distention, or inflammatory changes. No intraluminal contrast extravasation or pooling. Lymphatic: No enlarged abdominal or pelvic lymph nodes. Reproductive: Status post hysterectomy. No adnexal masses. Other: Moderate ascites.  No pneumoperitoneum. Musculoskeletal: No acute or significant osseous findings. IMPRESSION: 1. No intraluminal contrast extravasation or pooling to suggest active GI bleeding. 2. Cirrhosis with sequelae of portal hypertension including moderate  ascites, large splenorenal shunt, and gastric cardia and paraesophageal varices. 3. Increased moderate right and new small left pleural effusions with complete collapse of the visualized left lower lobe and partial collapse of the right lower lobe. 4.  Aortic atherosclerosis (ICD10-I70.0). Electronically Signed   By: Titus Dubin M.D.   On: 12/26/2021 16:44    Labs:  CBC: Recent Labs    01/02/2022 1047 12/30/2021 1416 01/14/2022 1506 01/21/2022 2033 01/16/2022 2312 12/30/21 0642  WBC 6.4  --  3.5*  --  7.2 8.6  HGB 9.6*   < > 7.9* 8.5* 10.7* 11.6*  HCT 31.8*   < > 26.0* 25.0* 32.5* 35.1*  PLT PLATELETS APPEAR DECREASED  --  51*  --  57* 66*   < > = values in this interval not displayed.    COAGS: Recent Labs    12/21/21 0419 12/24/2021 1047 01/12/2022 1506 12/30/21 0313  INR 2.0* 1.6* 1.7* 1.7*    BMP: Recent Labs    12/21/21 0419 12/28/21 1202 01/14/2022 1047 01/11/2022 1416 01/19/2022 2033 12/30/21 0313  NA 137 139 140 143 144 139  K 3.6 3.6 4.1 3.8 3.7 3.9  CL 112* 112* 111  --   --  110  CO2 23 23 25   --   --  21*  GLUCOSE 180* 200* 191*  --   --  142*  BUN 15 10 8   --   --  6*  CALCIUM 9.1 9.3 9.1  --   --  8.8*  CREATININE 1.03* 0.79 0.67  --   --  0.98  GFRNONAA >60 >60 >60  --   --  >60    LIVER FUNCTION TESTS: Recent Labs    12/20/21 0354 12/21/21 0419 12/31/2021 1047 12/30/21 0313  BILITOT 1.5* 1.9* 1.7* 3.8*  AST 28 27 36  38  ALT 19 17 22 21   ALKPHOS 70 74 78 72  PROT 7.0 7.5 7.4 6.9  ALBUMIN 2.4* 2.6* 2.4* 2.1*    Assessment and Plan:  66 y/o F with history of NASH cirrhosis/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  Patient doing well, still intubated but weaning sedation - becomes appropriately agitated when removing dressings on exam today, per sister turns to look at her when she calls her name. Puncture sites unremarkable. WBC 8.6, hgb 11.6, plt 66,  ammonia 64 this morning.  Plan: - Remove right IJ and right CFA dressings - Start lactulose 10 g TID today, titrate to 2-3 soft BMs QD - IR will continue to follow once extubated  Please call with questions or concerns.  Electronically Signed: Joaquim Nam, PA-C 12/30/2021, 10:30 AM   I spent a total of 15 Minutes at the the patient's bedside AND on the patient's hospital floor or unit, greater than 50% of which was counseling/coordinating [name] for TIPS follow up.

## 2021-12-30 NOTE — Progress Notes (Signed)
[facility] ADULT ICU REPLACEMENT PROTOCOL   The patient does apply for the [facility] Adult ICU Electrolyte Replacment Protocol based on the criteria listed below:   1.Exclusion criteria: TCTS patients, ECMO patients, and Dialysis patients 2. Is GFR >/= 30 ml/min? Yes.    Patient's GFR today is >60 3. Is SCr </= 2? Yes.   Patient's SCr is 0.98 mg/dL 4. Did SCr increase >/= 0.5 in 24 hours? No. 5.Pt's weight >40kg  Yes.   6. Abnormal electrolyte(s):   Mg 1.4  7. Electrolytes replaced per protocol 8.  Call MD STAT for K+ </= 2.5, Phos </= 1, or Mag </= 1 Physician:  Shawn Stall R Doniesha Landau 12/30/2021 5:16 AM

## 2021-12-30 NOTE — Progress Notes (Signed)
eLink Physician-Brief Progress Note Patient Name: Chelsea [name] DOB: 01/11/1956 MRN: [id number]   Date of Service  12/30/2021  HPI/Events of Note  Patient is hypertensive ( BP 179/79, MAP 110, HR 70)  eICU Interventions  PRN Hydralazine 5 mg Q 4 hours for SBP > 170 ordered.        Indiah Heyden U Cornie Herrington 12/30/2021, 1:19 AM

## 2021-12-30 NOTE — Anesthesia Postprocedure Evaluation (Signed)
Anesthesia Post Note  Patient: Chelsea [name]  Procedure(s) Performed: TIPS     Patient location during evaluation: ICU Anesthesia Type: General Level of consciousness: sedated and patient remains intubated per anesthesia plan Pain management: pain level controlled Vital Signs Assessment: post-procedure vital signs reviewed and stable Respiratory status: patient remains intubated per anesthesia plan Cardiovascular status: stable Postop Assessment: no apparent nausea or vomiting Anesthetic complications: no   No notable events documented.  Last Vitals:  Vitals:   01/14/2022 2319 12/30/21 0000  BP:  (!) 155/72  Pulse: 73 72  Resp: 18 18  Temp:    SpO2: 100% 100%    Last Pain:  Vitals:   01/02/2022 2319  TempSrc: Oral  PainSc:                  Audry Pili

## 2021-12-31 ENCOUNTER — Inpatient Hospital Stay (HOSPITAL_COMMUNITY): Payer: No Typology Code available for payment source

## 2021-12-31 DIAGNOSIS — I8511 Secondary esophageal varices with bleeding: Secondary | ICD-10-CM

## 2021-12-31 DIAGNOSIS — K7682 Hepatic encephalopathy: Secondary | ICD-10-CM | POA: Diagnosis not present

## 2021-12-31 DIAGNOSIS — I8500 Esophageal varices without bleeding: Secondary | ICD-10-CM | POA: Diagnosis not present

## 2021-12-31 DIAGNOSIS — K7460 Unspecified cirrhosis of liver: Secondary | ICD-10-CM | POA: Diagnosis not present

## 2021-12-31 DIAGNOSIS — Z95828 Presence of other vascular implants and grafts: Secondary | ICD-10-CM

## 2021-12-31 LAB — BASIC METABOLIC PANEL
Anion gap: 11 (ref 5–15)
BUN: 12 mg/dL (ref 8–23)
CO2: 19 mmol/L — ABNORMAL LOW (ref 22–32)
Calcium: 9.2 mg/dL (ref 8.9–10.3)
Chloride: 111 mmol/L (ref 98–111)
Creatinine, Ser: 1.24 mg/dL — ABNORMAL HIGH (ref 0.44–1.00)
GFR, Estimated: 49 mL/min — ABNORMAL LOW (ref >60)
Glucose, Bld: 177 mg/dL — ABNORMAL HIGH (ref 70–99)
Potassium: 4 mmol/L (ref 3.5–5.1)
Sodium: 141 mmol/L (ref 135–145)

## 2021-12-31 LAB — PHOSPHORUS: Phosphorus: 4 mg/dL (ref 2.5–4.6)

## 2021-12-31 LAB — GLUCOSE, CAPILLARY
Glucose-Capillary: 175 mg/dL — ABNORMAL HIGH (ref 70–99)
Glucose-Capillary: 155 mg/dL — ABNORMAL HIGH (ref 70–99)
Glucose-Capillary: 188 mg/dL — ABNORMAL HIGH (ref 70–99)
Glucose-Capillary: 226 mg/dL — ABNORMAL HIGH (ref 70–99)
Glucose-Capillary: 248 mg/dL — ABNORMAL HIGH (ref 70–99)
Glucose-Capillary: 223 mg/dL — ABNORMAL HIGH (ref 70–99)

## 2021-12-31 LAB — HEPATIC FUNCTION PANEL
ALT: 22 U/L (ref 0–44)
AST: 42 U/L — ABNORMAL HIGH (ref 15–41)
Albumin: 2.2 g/dL — ABNORMAL LOW (ref 3.5–5.0)
Alkaline Phosphatase: 70 U/L (ref 38–126)
Bilirubin, Direct: 1.3 mg/dL — ABNORMAL HIGH (ref 0.0–0.2)
Indirect Bilirubin: 1.9 mg/dL — ABNORMAL HIGH (ref 0.3–0.9)
Total Bilirubin: 3.2 mg/dL — ABNORMAL HIGH (ref 0.3–1.2)
Total Protein: 7 g/dL (ref 6.5–8.1)

## 2021-12-31 LAB — CBC
HCT: 35.7 % — ABNORMAL LOW (ref 36.0–46.0)
Hemoglobin: 11.7 g/dL — ABNORMAL LOW (ref 12.0–15.0)
MCH: 26.2 pg (ref 26.0–34.0)
MCHC: 32.8 g/dL (ref 30.0–36.0)
MCV: 79.9 fL — ABNORMAL LOW (ref 80.0–100.0)
Platelets: 132 10*3/uL — ABNORMAL LOW (ref 150–400)
RBC: 4.47 MIL/uL (ref 3.87–5.11)
RDW: 23.9 % — ABNORMAL HIGH (ref 11.5–15.5)
WBC: 15.6 10*3/uL — ABNORMAL HIGH (ref 4.0–10.5)
nRBC: 0 % (ref 0.0–0.2)

## 2021-12-31 LAB — MAGNESIUM: Magnesium: 2.1 mg/dL (ref 1.7–2.4)

## 2021-12-31 LAB — TROPONIN I (HIGH SENSITIVITY): Troponin I (High Sensitivity): 17 ng/L (ref <18)

## 2021-12-31 LAB — LACTIC ACID, PLASMA: Lactic Acid, Venous: 3.2 mmol/L (ref 0.5–1.9)

## 2021-12-31 MED ORDER — DILTIAZEM HCL ER COATED BEADS 180 MG PO CP24
180.0000 mg | ORAL_CAPSULE | Freq: Every day | ORAL | Status: DC
  Administered 2021-12-31: 180 mg via ORAL
  Filled 2021-12-31 (×2): qty 1

## 2021-12-31 MED ORDER — LACTULOSE ENEMA
300.0000 mL | Freq: Once | RECTAL | Status: DC
Start: 2022-01-01 — End: 2022-01-02
  Filled 2021-12-31: qty 300

## 2021-12-31 MED ORDER — HYDRALAZINE HCL 50 MG PO TABS
50.0000 mg | ORAL_TABLET | Freq: Four times a day (QID) | ORAL | Status: DC
Start: 2021-12-31 — End: 2022-01-03
  Administered 2021-12-31 – 2022-01-03 (×12): 50 mg via ORAL
  Filled 2021-12-31 (×14): qty 1

## 2021-12-31 MED ORDER — ALBUMIN HUMAN 25 % IV SOLN
12.5000 g | Freq: Once | INTRAVENOUS | Status: AC
Start: 2021-12-31 — End: 2021-12-31
  Administered 2021-12-31: 12.5 g via INTRAVENOUS
  Filled 2021-12-31: qty 50

## 2021-12-31 MED ORDER — ALBUMIN HUMAN 25 % IV SOLN
12.5000 g | Freq: Once | INTRAVENOUS | Status: AC
  Administered 2021-12-31: 12.5 g via INTRAVENOUS
  Filled 2021-12-31 (×2): qty 50

## 2021-12-31 MED ORDER — FUROSEMIDE 10 MG/ML IJ SOLN
40.0000 mg | Freq: Once | INTRAMUSCULAR | Status: AC
  Administered 2021-12-31: 40 mg via INTRAVENOUS
  Filled 2021-12-31: qty 4

## 2021-12-31 MED ORDER — IPRATROPIUM-ALBUTEROL 0.5-2.5 (3) MG/3ML IN SOLN
3.0000 mL | Freq: Three times a day (TID) | RESPIRATORY_TRACT | Status: DC
Start: 2021-12-31 — End: 2022-01-02
  Administered 2021-12-31 – 2022-01-01 (×6): 3 mL via RESPIRATORY_TRACT
  Filled 2021-12-31 (×4): qty 3

## 2021-12-31 MED ORDER — DILTIAZEM HCL 25 MG/5ML IV SOLN
10.0000 mg | Freq: Once | INTRAVENOUS | Status: AC
  Administered 2021-12-31: 10 mg via INTRAVENOUS
  Filled 2021-12-31: qty 5

## 2021-12-31 MED ORDER — LACTULOSE ENEMA
300.0000 mL | Freq: Once | ORAL | Status: AC
  Administered 2021-12-31: 300 mL via RECTAL
  Filled 2021-12-31: qty 300

## 2021-12-31 MED ORDER — MELATONIN 3 MG PO TABS
3.0000 mg | ORAL_TABLET | Freq: Once | ORAL | Status: AC
Start: 2021-12-31 — End: 2021-12-31
  Administered 2021-12-31: 3 mg via ORAL
  Filled 2021-12-31: qty 1

## 2021-12-31 MED ORDER — RIFAXIMIN 550 MG PO TABS
550.0000 mg | ORAL_TABLET | Freq: Two times a day (BID) | ORAL | Status: DC
  Administered 2021-12-31 – 2022-01-03 (×7): 550 mg via ORAL
  Filled 2021-12-31 (×7): qty 1

## 2021-12-31 MED ORDER — LACTULOSE 10 GM/15ML PO SOLN
20.0000 g | Freq: Four times a day (QID) | ORAL | Status: DC
  Administered 2021-12-31 – 2022-01-03 (×11): 20 g via ORAL
  Filled 2021-12-31 (×12): qty 30

## 2021-12-31 MED ORDER — RACEPINEPHRINE HCL 2.25 % IN NEBU
0.5000 mL | INHALATION_SOLUTION | RESPIRATORY_TRACT | Status: DC | PRN
  Administered 2021-12-31 (×2): 0.5 mL via RESPIRATORY_TRACT
  Filled 2021-12-31 (×2): qty 0.5

## 2021-12-31 MED ORDER — POTASSIUM CHLORIDE 10 MEQ/100ML IV SOLN
10.0000 meq | INTRAVENOUS | Status: AC
  Administered 2021-12-31 (×4): 10 meq via INTRAVENOUS
  Filled 2021-12-31 (×4): qty 100

## 2021-12-31 NOTE — Progress Notes (Signed)
Speech Language Pathology Treatment: Dysphagia  Patient Details Name: Chelsea [name] MRN: [id number] DOB: 02-06-1956 Today's Date: 12/31/2021 Time: 0840-0900 SLP Time Calculation (min) (ACUTE ONLY): 20 min  Assessment / Plan / Recommendation Clinical Impression  Patient seen by SLP for skilled treatment session focused on dysphagia goals. Sister in room and patient is still somewhat lethargic but much more alert than previous date. SpO2 and RR WFL prior to, during and after PO intake. Patient was demonstrating improved ability to move UE but still not strong enough for holding cup, utensil, etc and so SLP fed patient. She consumed straw sips of thin liquids and bites of gelatin. Mildly delayed oral phase and suspect swallow initiation delay but no overt s/s aspiration observed. At this time, SLP is recommending to initiate PO diet of full liquids (thin) and will follow for ability for upgraded solids trials. SLP will consult with attending MD and/or GI to verify her ability to tolerate solids secondary to recent h/o GI bleeding and currently s/p EGD and TIPS procedure.     HPI HPI: Patient is a 66 y.o. 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 She had recent admisison 5/24 to 12/21/21 at [facility] for upper GI bleeding and had EGD on 6/6 at [facility] for grade 3 varices but had difficulty with bleeding and required intubation for airway protection during the procedure and transferred to [facility] for IR to assess TIPS.      SLP Plan  Continue with current plan of care      Recommendations for follow up therapy are one component of a multi-disciplinary discharge planning process, led by the attending physician.  Recommendations may be updated based on patient status, additional functional criteria and insurance authorization.    Recommendations  Diet recommendations: Thin liquid;Other(comment) (full liquids) Medication Administration: Crushed with puree Supervision: Full  supervision/cueing for compensatory strategies;Staff to assist with self feeding;Trained caregiver to feed patient Compensations: Minimize environmental distractions;Slow rate;Small sips/bites Postural Changes and/or Swallow Maneuvers: Seated upright 90 degrees                Oral Care Recommendations: Staff/trained caregiver to provide oral care;Oral care before and after PO;Oral care BID Follow Up Recommendations: Follow physician's recommendations for discharge plan and follow up therapies (TBD) Assistance recommended at discharge: Set up Supervision/Assistance SLP Visit Diagnosis: Dysphagia, unspecified (R13.10) Plan: Continue with current plan of care         [name], MA, CCC-SLP Speech Therapy

## 2021-12-31 NOTE — Progress Notes (Signed)
eLink Physician-Brief Progress Note Patient Name: Chelsea [name] DOB: 1956/03/12 MRN: [id number]   Date of Service  12/31/2021  HPI/Events of Note  Repeat Lactic Acid = 3.2. NASH cirrhosis and liver dysfunction is contributing to the elevation of Lactic Acid.   eICU Interventions  Plan: 25% Albumin 12.5 gm IV now (second dose).     Intervention Category Major Interventions: Acid-Base disturbance - evaluation and management  Attikus Bartoszek Eugene 12/31/2021, 5:04 AM

## 2021-12-31 NOTE — Progress Notes (Signed)
Removed patient from bipap and placed her on 4lpm. Will continue to monitor patient.

## 2021-12-31 NOTE — Progress Notes (Signed)
NAME:  Chelsea [name], MRN:  [id number], DOB:  October 23, 1955, LOS: 2 ADMISSION DATE:  12/25/2021, CONSULTATION DATE:  12/29/2021 REFERRING MD:  Dr. 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  She had admission from 12/16/21 to 12/21/21 at [facility] for Upper GI bleeding.  She was on coumadin for hx of A fib.  She had EGD on 12/29/21 at [facility] for grade 3 varices.  She had difficulty with bleeding.  She required intubation for airway protection during the procedure.  GI d/w IR about TIPS.  Pt transferred to [facility] for further management.  Hx from chart and medical team.  6/6 tx to [facility], underwent TIPS. Tx back to ICU on vent. No further bleeding.  Pertinent  Medical History  Iron deficiency anemia, DM type 2, A fib, NASH with cirrhosis, COPD, CHF, HLD, OSA, CVA, HTN  Significant Hospital Events: Including procedures, antibiotic start and stop dates in addition to other pertinent events   6/06 EGD with varix banding at [facility], intubated, start octreotide/protonix gtt, transfer to [facility] for TIPS 6/7 extubated  Interim History / Subjective:  Hypertensive post extubation, there was question on art line accuracy as no changes after PRN meds but definitely changes on cuff pressure. Started on diltiazem as BP remained up, currently at 5. BP improved today, art line reading has improved, currently 150s/60s. More awake this AM. Will repeat swallow eval after failed yesterday.  Objective   Blood pressure 115/74, pulse (!) 102, temperature 98.1 F (36.7 C), temperature source Oral, resp. rate 20, height 5\' 7"  (1.702 m), weight 104.3 kg, SpO2 99 %.    Vent Mode: PSV;CPAP FiO2 (%):  [40 %] 40 % Set Rate:  [18 bmp] 18 bmp Vt Set:  [490 mL] 490 mL PEEP:  [5 cmH20] 5 cmH20 Pressure Support:  [10 cmH20] 10 cmH20 Plateau Pressure:  [22 cmH20-24 cmH20] 22 cmH20   Intake/Output Summary (Last 24 hours) at 12/31/2021  0736 Last data filed at 12/31/2021 0600 Gross per 24 hour  Intake 1799.07 ml  Output 2100 ml  Net -300.93 ml    Filed Weights   01/17/2022 0853  Weight: 104.3 kg    Examination: General: Adult female, resting in bed, in NAD. Neuro: Opens eyes to voice. Follows intermittent commands. HEENT: [place]/AT. Sclerae anicteric. Cardiovascular: IRIR, no M/R/G.  Lungs: Respirations even and unlabored.  CTA bilaterally, No W/R/R. Abdomen: BS x 4, soft, NT/ND.  Musculoskeletal: No gross deformities, no edema.  Skin: Intact, warm, no rashes.   Assessment & Plan:   Upper GI bleeding from esophageal varices in setting of NASH with cirrhosis - s/p TIPS 01/06/2022. - IR following, appreciate the assistance - Continue octreotide, protonix gtt - Will ask for GI to see to ensure nothing else required before outpatient follow up - day 3 of rocephin  Respiratory insufficiency - s/p intubation for TIPS procedure above. Now s/p extubation 6/7. Hx of COPD, OSA. - Bronchial hygiene - Continue BDs - Mobilize as able  Chronic Rt pleural effusion - present on imaging studies back to May 2022. 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 - Continue cardizem gtt - Hold outpt  cardizem, lasix, coumadin  Iron deficiency anemia in setting of Upper GI bleeding. - transfuse for Hb < 7  DM type 2 poorly controlled with hyperglycemia. - SSI - Hold home Metformin  Goals of care.   - DNR  Best Practice (right click and "Reselect all SmartList Selections" daily)   Diet/type: NPO, repeat swallow eval today DVT prophylaxis: SCD GI prophylaxis: PPI Lines: N/A Foley:  N/A Code Status:  DNR Last date of multidisciplinary goals of care discussion [x]   CC  time: 30 minutes    Montey Hora, [facility] For pager details, please see AMION or use Epic chat  After 1900, please call [place] for cross coverage needs 12/31/2021, 7:36 AM

## 2021-12-31 NOTE — Progress Notes (Signed)
Referring Physician(s): Dolores Frame, MD   Supervising Physician: Oley Balm  Patient Status:  [facility] - In-pt  Chief Complaint: Follow up emergent TIPS placement and coil embolization of the left gastric vein January 09, 2022 in IR (Dr. Elby Showers)  Subjective: Patient in bed, awake but groggy. Able to answer a few simple questions, able to follow commands. Her sister is at the bedside.   Allergies: Bee venom and Lisinopril  Medications: Prior to Admission medications   Medication Sig Start Date End Date Taking? Authorizing Provider  diltiazem (CARDIZEM CD) 180 MG 24 hr capsule Take 1 capsule (180 mg total) by mouth daily. 12/21/21  Yes Johnson, Clanford L, MD  furosemide (LASIX) 40 MG tablet TAKE 1 TABLET BY MOUTH EVERY DAY AS NEEDED 07/10/19  Yes Branch, Dorothe Pea, MD  metFORMIN (GLUCOPHAGE) 1000 MG tablet Take 1,000 mg by mouth 2 (two) times daily with a meal.   Yes [provider]  pantoprazole (PROTONIX) 40 MG tablet Take 1 tablet (40 mg total) by mouth daily. 12/22/21  Yes Johnson, Clanford L, MD  vitamin B-12 (CYANOCOBALAMIN) 1000 MCG tablet Take 1,000 mcg by mouth 3 (three) times a week. Patient not taking: Reported on 12/22/2021    [provider]  warfarin (COUMADIN) 6 MG tablet Take 6 mg by mouth every evening. Patient not taking: Reported on 12/22/2021 04/28/21   [provider]     Vital Signs: BP 134/76   Pulse 98   Temp 98.1 F (36.7 C) (Oral)   Resp (!) 24   Ht 5\' 7"  (1.702 m)   Wt 230 lb (104.3 kg)   SpO2 99%   BMI 36.02 kg/m   Physical Exam Constitutional:      General: She is not in acute distress. Cardiovascular:     Comments:  (+) Right IJ puncture site clean, dry, dressed appropriately. Soft. Dressing removed.  (+) Right CFV puncture site clean, dry, dressed appropriately. Soft. Dressing not removed due to patient positioning.  Skin:    General: Skin is warm and dry.  Neurological:     Mental Status: She is alert.      Comments: Unable to fully assess. Able to verbally answer a few simple questions. Able to follow commands.      Imaging: DG CHEST PORT 1 VIEW  Result Date: 12/31/2021 CLINICAL DATA:  Hypoxia, extubated EXAM: PORTABLE CHEST 1 VIEW COMPARISON:  12/30/2021 FINDINGS: Interval extubation. Similar cardiomegaly with diffuse airspace opacities versus edema. Bilateral pleural effusions layering posteriorly again noted. Little change in aeration. No pneumothorax. Trachea midline. Degenerative changes of the spine. Aorta atherosclerotic. IMPRESSION: Stable cardiomegaly with diffuse airspace process and pleural effusions. CHF is favored over severe pneumonia. Electronically Signed   By: Judie Petit.  Shick M.D.   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 Partial control of varices with banding. CT today demonstrates cirrhosis, gastroesophageal varices, splenomegaly, mild ascites, and hepatic hydrothorax. EXAM: 1. Ultrasound-guided access of the right internal jugular vein 2. Ultrasound-guided access of the right common femoral vein 3. Hepatic venogram 4. Intravascular ultrasound 5. Catheterization of the portal vein 6. Portal venous and central manometry 7. Portal venogram 8. Creation of a transhepatic portal vein to hepatic vein shunt 9. Coil embolization of gastroesophageal varices MEDICATIONS: The patient was receiving intravenous Rocephin as an inpatient, administered in appropriate time frame prior to  start of procedure. ANESTHESIA/SEDATION: General - as administered by the Anesthesia department CONTRAST:  One hundred thirty-five ML Omnipaque 300, intravenous FLUOROSCOPY TIME:  Fluoroscopy Time: 16 minutes, 18 seconds (931 mGy). COMPLICATIONS: None immediate. PROCEDURE: The procedure was performed with the assistance of my associate, Dr. Michaelle Birks.  Informed written consent was obtained from the patient's family after a thorough discussion of the procedural risks, benefits and alternatives. All questions were addressed. Maximal Sterile Barrier Technique was utilized including caps, mask, sterile gowns, sterile gloves, sterile drape, hand hygiene and skin antiseptic. A timeout was performed prior to the initiation of the procedure. A preliminary ultrasound of the right groin was performed and demonstrates a patent right common femoral vein. A permanent ultrasound image was recorded. Using a combination of fluoroscopy and ultrasound, an access site was determined. A small dermatotomy was made at the planned puncture site. Using ultrasound guidance, access into the right common femoral vein was obtained with visualization of needle entry into the vessel using a standard micropuncture technique. A wire was advanced into the IVC insert all fascial dilation performed. An 8 [place], 11 cm vascular sheath was placed into the external iliac vein. Through this access site, an 69 [place] ICE catheter was advanced with ease under fluoroscopic guidance to the level of the intrahepatic inferior vena cava. A preliminary ultrasound of the right neck was performed and demonstrates a patent internal jugular vein. A permanent ultrasound image was recorded. Using a combination of fluoroscopy and ultrasound, an access site was determined. A small dermatotomy was made at the planned puncture site. Using ultrasound guidance, access into the right internal jugular vein was obtained with visualization of needle entry into the vessel using a standard micropuncture technique. A wire was advanced into the IVC and serial fascial dilation performed. A 10 French tips sheath was placed into the internal jugular vein and advanced to the IVC. The jugular sheath was retracted into the right atrium and manometry was performed. A 5 French angled tip catheter was then directed into the right  hepatic vein. Hepatic venogram was performed. These images demonstrated patent hepatic vein with no stenosis. The catheter was advanced to a wedge portion of the a patent vein over which the 10 French sheath was advanced into the right hepatic vein. Using ICE ultrasound visualization the catheter as right hepatic vein as well as the portal anatomy was defined. A planned exit site from the hepatic vein and puncture site from the portal vein was placed into a single sonographic plane. Under direct ultrasound visualization, the ScorpionX needle was advanced into the central right portal vein. Hand injection of contrast confirmed position within the portal system. A Glidewire Advantage was then advanced into the superior mesenteric vein. A 5 French marking pigtail catheter was then advanced over the wire into the main portal vein and wire removed. Portal venogram was performed which demonstrated a patent portal vein. Multiple prominent varices were seen arising from the main portal vein via the left gastric vein. Portal manometry was then performed. The tract was then dilated to 8 mm with an 8 mm x 8 cm Athletis balloon. A 8-10 mm by 7 + 2 mm of Viatorr was placed. After placement of the shunt, right atrial and portal pressures were repeated. Portal venogram demonstrates a patent TIPS endograft without unchanged gastroesophageal varices. Therefore, the pigtail catheter was exchanged for a Navicross catheter. There was difficulty selecting the left gastric vein due to angulation, therefore the Navicross was exchanged for a C2  catheter which successfully selected the left gastric vein. Venogram was performed which demonstrated prominent gastroesophageal varices without active extravasation, draining in hepatofugal fashion via a splenorenal shunt. The C2 catheter was then exchanged for a Navicross catheter. Total of 3, 10 mm and 2, 13 mm 0.035 "[place] detachable coils were deployed in the central aspect of the left gastric  vein. Completion portal venogram was then performed which demonstrated successful embolization of the left gastric vein and brisk antegrade flow through the main portal vein and indwelling TIPS endograft. The catheters and sheath were removed and manual compression was applied to the right internal jugular and right common femoral venous access sites until hemostasis was achieved. The patient was transferred to the PACU in stable condition. Pre-TIPS Mean Pressures (mmHg): Right atrium: 23 Portal vein: 42 Portosystemic gradient: 19 Post-TIPS Mean Pressures (mmHg): Right atrium:34 Portal vein: 38 Portosystemic gradient: 4 Despite appropriate waveforms and multiple measurements, these pressures were thought to be erroneously elevated. IMPRESSION: 1. Successful transjugular portosystemic shunt creation. 2. Successful coil embolization of left gastric vein. 3. Portosystemic gradient of 19 mm Hg (absolute portal venous pressure 42 mm Hg) before shunt placement and 4 mm Hg (absolute portal venous pressure 38 mm Hg) after shunt placement. PLAN: Patient will be followed closely by the Interventional Radiology team while inpatient, and by the Interventional Radiology Portal Hypertension Clinic after discharge. Ruthann Cancer, MD [facility] and Interventional Radiology Specialists [facility] Radiology Electronically Signed   By: Ruthann Cancer M.D.   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 Partial control of varices with banding. CT today demonstrates cirrhosis, gastroesophageal varices, splenomegaly, mild ascites, and hepatic hydrothorax. EXAM: 1. Ultrasound-guided access of the right internal jugular vein 2. Ultrasound-guided access of the right common femoral vein 3. Hepatic venogram 4. Intravascular ultrasound 5.  Catheterization of the portal vein 6. Portal venous and central manometry 7. Portal venogram 8. Creation of a transhepatic portal vein to hepatic vein shunt 9. Coil embolization of gastroesophageal varices MEDICATIONS: The patient was receiving intravenous Rocephin as an inpatient, administered in appropriate time frame prior to start of procedure. ANESTHESIA/SEDATION: General - as administered by the Anesthesia department CONTRAST:  One hundred thirty-five ML Omnipaque 300, intravenous FLUOROSCOPY TIME:  Fluoroscopy Time: 16 minutes, 18 seconds (931 mGy). COMPLICATIONS: None immediate. PROCEDURE: The procedure was performed with the assistance of my associate, Dr. Michaelle Birks. Informed written consent was obtained from the patient's family after a thorough discussion of the procedural risks, benefits and alternatives. All questions were addressed. Maximal Sterile Barrier Technique was utilized including caps, mask, sterile gowns, sterile gloves, sterile drape, hand hygiene and skin antiseptic. A timeout was performed prior to the initiation of the procedure. A preliminary ultrasound of the right groin was performed and demonstrates a patent right common femoral vein. A permanent ultrasound image was recorded. Using a combination of fluoroscopy and ultrasound, an access site was determined. A small dermatotomy was made at the planned puncture site. Using ultrasound guidance, access into the right common femoral vein was obtained with visualization of needle entry into the vessel using a standard micropuncture technique. A wire was advanced into the IVC insert all fascial dilation performed. An 8 [place], 11 cm vascular sheath was placed into the external iliac vein. Through this access site, an 30 [place] Accunav ICE catheter was advanced  with ease under fluoroscopic guidance to the level of the intrahepatic inferior vena cava. A preliminary ultrasound of the right neck was performed and demonstrates a patent internal  jugular vein. A permanent ultrasound image was recorded. Using a combination of fluoroscopy and ultrasound, an access site was determined. A small dermatotomy was made at the planned puncture site. Using ultrasound guidance, access into the right internal jugular vein was obtained with visualization of needle entry into the vessel using a standard micropuncture technique. A wire was advanced into the IVC and serial fascial dilation performed. A 10 French tips sheath was placed into the internal jugular vein and advanced to the IVC. The jugular sheath was retracted into the right atrium and manometry was performed. A 5 French angled tip catheter was then directed into the right hepatic vein. Hepatic venogram was performed. These images demonstrated patent hepatic vein with no stenosis. The catheter was advanced to a wedge portion of the a patent vein over which the 10 French sheath was advanced into the right hepatic vein. Using ICE ultrasound visualization the catheter as right hepatic vein as well as the portal anatomy was defined. A planned exit site from the hepatic vein and puncture site from the portal vein was placed into a single sonographic plane. Under direct ultrasound visualization, the ScorpionX needle was advanced into the central right portal vein. Hand injection of contrast confirmed position within the portal system. A Glidewire Advantage was then advanced into the superior mesenteric vein. A 5 French marking pigtail catheter was then advanced over the wire into the main portal vein and wire removed. Portal venogram was performed which demonstrated a patent portal vein. Multiple prominent varices were seen arising from the main portal vein via the left gastric vein. Portal manometry was then performed. The tract was then dilated to 8 mm with an 8 mm x 8 cm Athletis balloon. A 8-10 mm by 7 + 2 mm of Viatorr was placed. After placement of the shunt, right atrial and portal pressures were repeated.  Portal venogram demonstrates a patent TIPS endograft without unchanged gastroesophageal varices. Therefore, the pigtail catheter was exchanged for a Navicross catheter. There was difficulty selecting the left gastric vein due to angulation, therefore the Navicross was exchanged for a C2 catheter which successfully selected the left gastric vein. Venogram was performed which demonstrated prominent gastroesophageal varices without active extravasation, draining in hepatofugal fashion via a splenorenal shunt. The C2 catheter was then exchanged for a Navicross catheter. Total of 3, 10 mm and 2, 13 mm 0.035 "Azur detachable coils were deployed in the central aspect of the left gastric vein. Completion portal venogram was then performed which demonstrated successful embolization of the left gastric vein and brisk antegrade flow through the main portal vein and indwelling TIPS endograft. The catheters and sheath were removed and manual compression was applied to the right internal jugular and right common femoral venous access sites until hemostasis was achieved. The patient was transferred to the PACU in stable condition. Pre-TIPS Mean Pressures (mmHg): Right atrium: 23 Portal vein: 42 Portosystemic gradient: 19 Post-TIPS Mean Pressures (mmHg): Right atrium:34 Portal vein: 38 Portosystemic gradient: 4 Despite appropriate waveforms and multiple measurements, these pressures were thought to be erroneously elevated. IMPRESSION: 1. Successful transjugular portosystemic shunt creation. 2. Successful coil embolization of left gastric vein. 3. Portosystemic gradient of 19 mm Hg (absolute portal venous pressure 42 mm Hg) before shunt placement and 4 mm Hg (absolute portal venous pressure 38 mm Hg) after shunt placement. PLAN:  Patient will be followed closely by the Interventional Radiology team while inpatient, and by the Interventional Radiology Portal Hypertension Clinic after discharge. Ruthann Cancer, MD Vascular and  Interventional Radiology Specialists [facility] Radiology Electronically Signed   By: Ruthann Cancer M.D.   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 Partial control of varices with banding. CT today demonstrates cirrhosis, gastroesophageal varices, splenomegaly, mild ascites, and hepatic hydrothorax. EXAM: 1. Ultrasound-guided access of the right internal jugular vein 2. Ultrasound-guided access of the right common femoral vein 3. Hepatic venogram 4. Intravascular ultrasound 5. Catheterization of the portal vein 6. Portal venous and central manometry 7. Portal venogram 8. Creation of a transhepatic portal vein to hepatic vein shunt 9. Coil embolization of gastroesophageal varices MEDICATIONS: The patient was receiving intravenous Rocephin as an inpatient, administered in appropriate time frame prior to start of procedure. ANESTHESIA/SEDATION: General - as administered by the Anesthesia department CONTRAST:  One hundred thirty-five ML Omnipaque 300, intravenous FLUOROSCOPY TIME:  Fluoroscopy Time: 16 minutes, 18 seconds (931 mGy). COMPLICATIONS: None immediate. PROCEDURE: The procedure was performed with the assistance of my associate, Dr. Michaelle Birks. Informed written consent was obtained from the patient's family after a thorough discussion of the procedural risks, benefits and alternatives. All questions were addressed. Maximal Sterile Barrier Technique was utilized including caps, mask, sterile gowns, sterile gloves, sterile drape, hand hygiene and skin antiseptic. A timeout was performed prior to the initiation of the procedure. A preliminary ultrasound of the right groin was performed and demonstrates a patent right common femoral vein. A permanent ultrasound image was recorded. Using a combination of fluoroscopy  and ultrasound, an access site was determined. A small dermatotomy was made at the planned puncture site. Using ultrasound guidance, access into the right common femoral vein was obtained with visualization of needle entry into the vessel using a standard micropuncture technique. A wire was advanced into the IVC insert all fascial dilation performed. An 8 [place], 11 cm vascular sheath was placed into the external iliac vein. Through this access site, an 15 [place] ICE catheter was advanced with ease under fluoroscopic guidance to the level of the intrahepatic inferior vena cava. A preliminary ultrasound of the right neck was performed and demonstrates a patent internal jugular vein. A permanent ultrasound image was recorded. Using a combination of fluoroscopy and ultrasound, an access site was determined. A small dermatotomy was made at the planned puncture site. Using ultrasound guidance, access into the right internal jugular vein was obtained with visualization of needle entry into the vessel using a standard micropuncture technique. A wire was advanced into the IVC and serial fascial dilation performed. A 10 French tips sheath was placed into the internal jugular vein and advanced to the IVC. The jugular sheath was retracted into the right atrium and manometry was performed. A 5 French angled tip catheter was then directed into the right hepatic vein. Hepatic venogram was performed. These images demonstrated patent hepatic vein with no stenosis. The catheter was advanced to a wedge portion of the a patent vein over which the 10 French sheath was advanced into the right hepatic vein. Using ICE ultrasound visualization the catheter as right hepatic vein as well as the portal anatomy was defined. A planned exit site from the hepatic vein and puncture site from the portal vein  was placed into a single sonographic plane. Under direct ultrasound visualization, the ScorpionX needle was advanced into the central  right portal vein. Hand injection of contrast confirmed position within the portal system. A Glidewire Advantage was then advanced into the superior mesenteric vein. A 5 French marking pigtail catheter was then advanced over the wire into the main portal vein and wire removed. Portal venogram was performed which demonstrated a patent portal vein. Multiple prominent varices were seen arising from the main portal vein via the left gastric vein. Portal manometry was then performed. The tract was then dilated to 8 mm with an 8 mm x 8 cm Athletis balloon. A 8-10 mm by 7 + 2 mm of Viatorr was placed. After placement of the shunt, right atrial and portal pressures were repeated. Portal venogram demonstrates a patent TIPS endograft without unchanged gastroesophageal varices. Therefore, the pigtail catheter was exchanged for a Navicross catheter. There was difficulty selecting the left gastric vein due to angulation, therefore the Navicross was exchanged for a C2 catheter which successfully selected the left gastric vein. Venogram was performed which demonstrated prominent gastroesophageal varices without active extravasation, draining in hepatofugal fashion via a splenorenal shunt. The C2 catheter was then exchanged for a Navicross catheter. Total of 3, 10 mm and 2, 13 mm 0.035 "Azur detachable coils were deployed in the central aspect of the left gastric vein. Completion portal venogram was then performed which demonstrated successful embolization of the left gastric vein and brisk antegrade flow through the main portal vein and indwelling TIPS endograft. The catheters and sheath were removed and manual compression was applied to the right internal jugular and right common femoral venous access sites until hemostasis was achieved. The patient was transferred to the PACU in stable condition. Pre-TIPS Mean Pressures (mmHg): Right atrium: 23 Portal vein: 42 Portosystemic gradient: 19 Post-TIPS Mean Pressures (mmHg): Right  atrium:34 Portal vein: 38 Portosystemic gradient: 4 Despite appropriate waveforms and multiple measurements, these pressures were thought to be erroneously elevated. IMPRESSION: 1. Successful transjugular portosystemic shunt creation. 2. Successful coil embolization of left gastric vein. 3. Portosystemic gradient of 19 mm Hg (absolute portal venous pressure 42 mm Hg) before shunt placement and 4 mm Hg (absolute portal venous pressure 38 mm Hg) after shunt placement. PLAN: Patient will be followed closely by the Interventional Radiology team while inpatient, and by the Interventional Radiology Portal Hypertension Clinic after discharge. Ruthann Cancer, MD Vascular and Interventional Radiology Specialists [facility] Radiology Electronically Signed   By: Ruthann Cancer M.D.   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 Partial control of varices with banding. CT today demonstrates cirrhosis, gastroesophageal varices, splenomegaly, mild ascites, and hepatic hydrothorax. EXAM: 1. Ultrasound-guided access of the right internal jugular vein 2. Ultrasound-guided access of the right common femoral vein 3. Hepatic venogram 4. Intravascular ultrasound 5. Catheterization of the portal vein 6. Portal venous and central manometry 7. Portal venogram 8. Creation of a transhepatic portal vein to hepatic vein shunt 9. Coil embolization of gastroesophageal varices MEDICATIONS: The patient was receiving intravenous Rocephin as an inpatient, administered in appropriate time frame prior to start of procedure. ANESTHESIA/SEDATION: General - as administered by the Anesthesia department CONTRAST:  One hundred thirty-five ML Omnipaque 300, intravenous FLUOROSCOPY TIME:  Fluoroscopy Time: 16 minutes, 18 seconds (931 mGy). COMPLICATIONS: None  immediate.  PROCEDURE: The procedure was performed with the assistance of my associate, Dr. Michaelle Birks. Informed written consent was obtained from the patient's family after a thorough discussion of the procedural risks, benefits and alternatives. All questions were addressed. Maximal Sterile Barrier Technique was utilized including caps, mask, sterile gowns, sterile gloves, sterile drape, hand hygiene and skin antiseptic. A timeout was performed prior to the initiation of the procedure. A preliminary ultrasound of the right groin was performed and demonstrates a patent right common femoral vein. A permanent ultrasound image was recorded. Using a combination of fluoroscopy and ultrasound, an access site was determined. A small dermatotomy was made at the planned puncture site. Using ultrasound guidance, access into the right common femoral vein was obtained with visualization of needle entry into the vessel using a standard micropuncture technique. A wire was advanced into the IVC insert all fascial dilation performed. An 8 [place], 11 cm vascular sheath was placed into the external iliac vein. Through this access site, an 31 [place] ICE catheter was advanced with ease under fluoroscopic guidance to the level of the intrahepatic inferior vena cava. A preliminary ultrasound of the right neck was performed and demonstrates a patent internal jugular vein. A permanent ultrasound image was recorded. Using a combination of fluoroscopy and ultrasound, an access site was determined. A small dermatotomy was made at the planned puncture site. Using ultrasound guidance, access into the right internal jugular vein was obtained with visualization of needle entry into the vessel using a standard micropuncture technique. A wire was advanced into the IVC and serial fascial dilation performed. A 10 French tips sheath was placed into the internal jugular vein and advanced to the IVC. The jugular sheath was retracted into the  right atrium and manometry was performed. A 5 French angled tip catheter was then directed into the right hepatic vein. Hepatic venogram was performed. These images demonstrated patent hepatic vein with no stenosis. The catheter was advanced to a wedge portion of the a patent vein over which the 10 French sheath was advanced into the right hepatic vein. Using ICE ultrasound visualization the catheter as right hepatic vein as well as the portal anatomy was defined. A planned exit site from the hepatic vein and puncture site from the portal vein was placed into a single sonographic plane. Under direct ultrasound visualization, the ScorpionX needle was advanced into the central right portal vein. Hand injection of contrast confirmed position within the portal system. A Glidewire Advantage was then advanced into the superior mesenteric vein. A 5 French marking pigtail catheter was then advanced over the wire into the main portal vein and wire removed. Portal venogram was performed which demonstrated a patent portal vein. Multiple prominent varices were seen arising from the main portal vein via the left gastric vein. Portal manometry was then performed. The tract was then dilated to 8 mm with an 8 mm x 8 cm Athletis balloon. A 8-10 mm by 7 + 2 mm of Viatorr was placed. After placement of the shunt, right atrial and portal pressures were repeated. Portal venogram demonstrates a patent TIPS endograft without unchanged gastroesophageal varices. Therefore, the pigtail catheter was exchanged for a Navicross catheter. There was difficulty selecting the left gastric vein due to angulation, therefore the Navicross was exchanged for a C2 catheter which successfully selected the left gastric vein. Venogram was performed which demonstrated prominent gastroesophageal varices without active extravasation, draining in hepatofugal fashion via a splenorenal shunt. The C2 catheter was then exchanged for a Navicross  catheter. Total of 3,  10 mm and 2, 13 mm 0.035 "Azur detachable coils were deployed in the central aspect of the left gastric vein. Completion portal venogram was then performed which demonstrated successful embolization of the left gastric vein and brisk antegrade flow through the main portal vein and indwelling TIPS endograft. The catheters and sheath were removed and manual compression was applied to the right internal jugular and right common femoral venous access sites until hemostasis was achieved. The patient was transferred to the PACU in stable condition. Pre-TIPS Mean Pressures (mmHg): Right atrium: 23 Portal vein: 42 Portosystemic gradient: 19 Post-TIPS Mean Pressures (mmHg): Right atrium:34 Portal vein: 38 Portosystemic gradient: 4 Despite appropriate waveforms and multiple measurements, these pressures were thought to be erroneously elevated. IMPRESSION: 1. Successful transjugular portosystemic shunt creation. 2. Successful coil embolization of left gastric vein. 3. Portosystemic gradient of 19 mm Hg (absolute portal venous pressure 42 mm Hg) before shunt placement and 4 mm Hg (absolute portal venous pressure 38 mm Hg) after shunt placement. PLAN: Patient will be followed closely by the Interventional Radiology team while inpatient, and by the Interventional Radiology Portal Hypertension Clinic after discharge. Ruthann Cancer, MD [facility] Electronically Signed   By: Ruthann Cancer M.D.   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 Partial control of varices with banding. CT today demonstrates cirrhosis, gastroesophageal varices, splenomegaly, mild ascites, and hepatic hydrothorax. EXAM: 1. Ultrasound-guided access of the right internal jugular  vein 2. Ultrasound-guided access of the right common femoral vein 3. Hepatic venogram 4. Intravascular ultrasound 5. Catheterization of the portal vein 6. Portal venous and central manometry 7. Portal venogram 8. Creation of a transhepatic portal vein to hepatic vein shunt 9. Coil embolization of gastroesophageal varices MEDICATIONS: The patient was receiving intravenous Rocephin as an inpatient, administered in appropriate time frame prior to start of procedure. ANESTHESIA/SEDATION: General - as administered by the Anesthesia department CONTRAST:  One hundred thirty-five ML Omnipaque 300, intravenous FLUOROSCOPY TIME:  Fluoroscopy Time: 16 minutes, 18 seconds (931 mGy). COMPLICATIONS: None immediate. PROCEDURE: The procedure was performed with the assistance of my associate, Dr. Michaelle Birks. Informed written consent was obtained from the patient's family after a thorough discussion of the procedural risks, benefits and alternatives. All questions were addressed. Maximal Sterile Barrier Technique was utilized including caps, mask, sterile gowns, sterile gloves, sterile drape, hand hygiene and skin antiseptic. A timeout was performed prior to the initiation of the procedure. A preliminary ultrasound of the right groin was performed and demonstrates a patent right common femoral vein. A permanent ultrasound image was recorded. Using a combination of fluoroscopy and ultrasound, an access site was determined. A small dermatotomy was made at the planned puncture site. Using ultrasound guidance, access into the right common femoral vein was obtained with visualization of needle entry into the vessel using a standard micropuncture technique. A wire was advanced into the IVC insert all fascial dilation performed. An 8 [place], 11 cm vascular sheath was placed into the external iliac vein. Through this access site, an 4 [place] ICE catheter was advanced with ease under fluoroscopic guidance to the level of the  intrahepatic inferior vena cava. A preliminary ultrasound of the right neck was performed and demonstrates a patent internal jugular vein.  A permanent ultrasound image was recorded. Using a combination of fluoroscopy and ultrasound, an access site was determined. A small dermatotomy was made at the planned puncture site. Using ultrasound guidance, access into the right internal jugular vein was obtained with visualization of needle entry into the vessel using a standard micropuncture technique. A wire was advanced into the IVC and serial fascial dilation performed. A 10 French tips sheath was placed into the internal jugular vein and advanced to the IVC. The jugular sheath was retracted into the right atrium and manometry was performed. A 5 French angled tip catheter was then directed into the right hepatic vein. Hepatic venogram was performed. These images demonstrated patent hepatic vein with no stenosis. The catheter was advanced to a wedge portion of the a patent vein over which the 10 French sheath was advanced into the right hepatic vein. Using ICE ultrasound visualization the catheter as right hepatic vein as well as the portal anatomy was defined. A planned exit site from the hepatic vein and puncture site from the portal vein was placed into a single sonographic plane. Under direct ultrasound visualization, the ScorpionX needle was advanced into the central right portal vein. Hand injection of contrast confirmed position within the portal system. A Glidewire Advantage was then advanced into the superior mesenteric vein. A 5 French marking pigtail catheter was then advanced over the wire into the main portal vein and wire removed. Portal venogram was performed which demonstrated a patent portal vein. Multiple prominent varices were seen arising from the main portal vein via the left gastric vein. Portal manometry was then performed. The tract was then dilated to 8 mm with an 8 mm x 8 cm Athletis balloon. A  8-10 mm by 7 + 2 mm of Viatorr was placed. After placement of the shunt, right atrial and portal pressures were repeated. Portal venogram demonstrates a patent TIPS endograft without unchanged gastroesophageal varices. Therefore, the pigtail catheter was exchanged for a Navicross catheter. There was difficulty selecting the left gastric vein due to angulation, therefore the Navicross was exchanged for a C2 catheter which successfully selected the left gastric vein. Venogram was performed which demonstrated prominent gastroesophageal varices without active extravasation, draining in hepatofugal fashion via a splenorenal shunt. The C2 catheter was then exchanged for a Navicross catheter. Total of 3, 10 mm and 2, 13 mm 0.035 "Azur detachable coils were deployed in the central aspect of the left gastric vein. Completion portal venogram was then performed which demonstrated successful embolization of the left gastric vein and brisk antegrade flow through the main portal vein and indwelling TIPS endograft. The catheters and sheath were removed and manual compression was applied to the right internal jugular and right common femoral venous access sites until hemostasis was achieved. The patient was transferred to the PACU in stable condition. Pre-TIPS Mean Pressures (mmHg): Right atrium: 23 Portal vein: 42 Portosystemic gradient: 19 Post-TIPS Mean Pressures (mmHg): Right atrium:34 Portal vein: 38 Portosystemic gradient: 4 Despite appropriate waveforms and multiple measurements, these pressures were thought to be erroneously elevated. IMPRESSION: 1. Successful transjugular portosystemic shunt creation. 2. Successful coil embolization of left gastric vein. 3. Portosystemic gradient of 19 mm Hg (absolute portal venous pressure 42 mm Hg) before shunt placement and 4 mm Hg (absolute portal venous pressure 38 mm Hg) after shunt placement. PLAN: Patient will be followed closely by the Interventional Radiology team while  inpatient, and by the Interventional Radiology Portal Hypertension Clinic after discharge. Ruthann Cancer, MD [facility]  [facility] Electronically Signed   By: Ruthann Cancer M.D.   On: 12/30/2021 14:34   DG Chest Port 1 View  Result Date: 12/30/2021 CLINICAL DATA:  Her story failure EXAM: PORTABLE CHEST 1 VIEW COMPARISON:  CT chest 12/13/2020 FINDINGS: Endotracheal tube with the tip 5.3 cm above the carina. Bilateral interstitial and patchy alveolar airspace opacities, right greater than left. Small bilateral pleural effusions. No pneumothorax. Stable cardiomegaly. No acute osseous abnormality. IMPRESSION: 1. Endotracheal tube with the tip 5.3 cm above the carina. 2. Bilateral interstitial and patchy alveolar airspace opacities, right greater than left, and small bilateral pleural effusions. Differential considerations include pulmonary edema versus multilobar pneumonia. Electronically Signed   By: Kathreen Devoid M.D.   On: 12/30/2021 07:29   ECHOCARDIOGRAM LIMITED  Result Date: 01/20/2022    ECHOCARDIOGRAM LIMITED REPORT   Patient Name:   SYNA HIGHSMITH Date of Exam: 12/24/2021 Medical Rec #:  [id number]         Height:       67.0 in Accession #:    [id number]        Weight:       230.0 lb Date of Birth:  10-31-1955         BSA:          2.146 m Patient Age:    66 years          BP:           124/75 mmHg Patient Gender: F                 HR:           78 bpm. Exam Location:  Inpatient Procedure: Limited Echo, Cardiac Doppler and Color Doppler                     STAT ECHO Reported to: Dr Marry Guan on 01/07/2022 4:37:00 PM. Indications:    Abnormal ECG  History:        Patient has prior history of Echocardiogram examinations, most                 recent 11/03/2011. CHF, Arrythmias:Atrial Fibrillation; Risk                 Factors:Diabetes, Hypertension, Sleep Apnea and Dyslipidemia.                 Cirrhosis.  Sonographer:    Clayton Lefort RDCS (AE) Referring Phys:  [phone] MATTHEW R HUNSUCKER  Sonographer Comments: Echo performed with patient supine and on artificial respirator. IMPRESSIONS  1. The left ventricle has no regional wall motion abnormalities. Left ventricular diastolic parameters are consistent with Grade II diastolic dysfunction (pseudonormalization).  2. Right ventricular systolic function is normal. The right ventricular size is normal. Tricuspid regurgitation signal is inadequate for assessing PA pressure.  3. Left atrial size was mildly dilated.  4. The mitral valve is grossly normal. Trivial mitral valve regurgitation. No evidence of mitral stenosis.  5. The aortic valve is grossly normal. Aortic valve regurgitation is not visualized. No aortic stenosis is present. FINDINGS  Left Ventricle: The left ventricle has no regional wall motion abnormalities. The left ventricular internal cavity size was normal in size. There is no left ventricular hypertrophy. Left ventricular diastolic parameters are consistent with Grade II diastolic dysfunction (pseudonormalization). Right Ventricle: The right ventricular size is normal. No increase in right ventricular wall thickness. Right ventricular systolic function is normal. Tricuspid regurgitation signal is inadequate for assessing  PA pressure. Left Atrium: Left atrial size was mildly dilated. Right Atrium: Right atrial size was normal in size. Pericardium: Trivial pericardial effusion is present. Mitral Valve: The mitral valve is grossly normal. Mild mitral annular calcification. Trivial mitral valve regurgitation. No evidence of mitral valve stenosis. Tricuspid Valve: The tricuspid valve is grossly normal. Tricuspid valve regurgitation is trivial. No evidence of tricuspid stenosis. Aortic Valve: The aortic valve is grossly normal. Aortic valve regurgitation is not visualized. No aortic stenosis is present. Aortic valve mean gradient measures 6.0 mmHg. Aortic valve peak gradient measures 11.7 mmHg. Aortic valve area, by  VTI measures  1.73 cm. Pulmonic Valve: The pulmonic valve was grossly normal. Pulmonic valve regurgitation is not visualized. No evidence of pulmonic stenosis. Aorta: The aortic root and ascending aorta are structurally normal, with no evidence of dilitation. Venous: IVC assessment for right atrial pressure unable to be performed due to mechanical ventilation. IAS/Shunts: The atrial septum is grossly normal. Additional Comments: There is a small pleural effusion in the left lateral region. LEFT VENTRICLE PLAX 2D LVIDd:         4.80 cm   Diastology LVIDs:         3.00 cm   LV e' medial:    5.77 cm/s LV PW:         1.10 cm   LV E/e' medial:  20.3 LV IVS:        1.10 cm   LV e' lateral:   5.11 cm/s LVOT diam:     1.80 cm   LV E/e' lateral: 22.9 LV SV:         68 LV SV Index:   32 LVOT Area:     2.54 cm  IVC IVC diam: 2.40 cm LEFT ATRIUM         Index LA diam:    4.40 cm 2.05 cm/m  AORTIC VALVE AV Area (Vmax):    1.44 cm AV Area (Vmean):   1.50 cm AV Area (VTI):     1.73 cm AV Vmax:           171.00 cm/s AV Vmean:          118.000 cm/s AV VTI:            0.394 m AV Peak Grad:      11.7 mmHg AV Mean Grad:      6.0 mmHg LVOT Vmax:         96.70 cm/s LVOT Vmean:        69.700 cm/s LVOT VTI:          0.268 m LVOT/AV VTI ratio: 0.68  AORTA Ao Root diam: 3.00 cm Ao Asc diam:  3.40 cm MITRAL VALVE MV Area (PHT): 2.86 cm     SHUNTS MV Decel Time: 265 msec     Systemic VTI:  0.27 m MV E velocity: 117.00 cm/s  Systemic Diam: 1.80 cm MV A velocity: 100.00 cm/s MV E/A ratio:  1.17 Eleonore Chiquito MD Electronically signed by Eleonore Chiquito MD Signature Date/Time: 01/07/2022/5:49:02 PM    Final    CT ANGIO GI BLEED  Result Date: 01/14/2022 CLINICAL DATA:  Bleeding esophageal varices. EXAM: CTA ABDOMEN AND PELVIS WITHOUT AND WITH CONTRAST TECHNIQUE: Multidetector CT imaging of the abdomen and pelvis was performed using the standard protocol during bolus administration of intravenous contrast. Multiplanar reconstructed images and  MIPs were obtained and reviewed to evaluate the vascular anatomy. RADIATION DOSE REDUCTION: This exam was performed according to the departmental dose-optimization program  which includes automated exposure control, adjustment of the mA and/or kV according to patient size and/or use of iterative reconstruction technique. CONTRAST:  100mL OMNIPAQUE IOHEXOL 350 MG/ML SOLN COMPARISON:  CT chest dated Dec 13, 2020. CT abdomen pelvis dated October 09, 2017. FINDINGS: VASCULAR Aorta: Normal caliber aorta without aneurysm, dissection, vasculitis or significant stenosis. Mild-to-moderate atherosclerotic calcification. Celiac: Patent without evidence of aneurysm, dissection, vasculitis or significant stenosis. SMA: Patent without evidence of aneurysm, dissection, vasculitis or significant stenosis. Renals: Both renal arteries are patent without evidence of aneurysm, dissection, vasculitis, fibromuscular dysplasia or significant stenosis. IMA: Patent without evidence of aneurysm, dissection, vasculitis or significant stenosis. Inflow: Patent without evidence of aneurysm, dissection, vasculitis or significant stenosis. Proximal Outflow: Bilateral common femoral and visualized portions of the superficial and profunda femoral arteries are patent without evidence of aneurysm, dissection, vasculitis or significant stenosis. Veins: Patent portal system. Large splenorenal shunt noted. Gastric cardia and paraesophageal varices. Review of the MIP images confirms the above findings. NON-VASCULAR Lower chest: Increased moderate right and new small left pleural effusions. Complete collapse of the visualized left lower lobe. Partial collapse of the right lower lobe. Hepatobiliary: Unchanged nodular liver contour. No focal liver abnormality. Layering sludge within the gallbladder. No gallbladder wall thickening or biliary dilatation. Pancreas: Unremarkable. No pancreatic ductal dilatation or surrounding inflammatory changes. Spleen: Unchanged  moderate splenomegaly.  No focal abnormality. Adrenals/Urinary Tract: Adrenal glands are unremarkable. Small right renal simple cyst inferiorly. No follow-up imaging is recommended. No renal calculi or hydronephrosis. Bladder is decompressed by Foley catheter. Stomach/Bowel: Stomach is within normal limits. Diminutive or absent appendix. No evidence of bowel wall thickening, distention, or inflammatory changes. No intraluminal contrast extravasation or pooling. Lymphatic: No enlarged abdominal or pelvic lymph nodes. Reproductive: Status post hysterectomy. No adnexal masses. Other: Moderate ascites.  No pneumoperitoneum. Musculoskeletal: No acute or significant osseous findings. IMPRESSION: 1. No intraluminal contrast extravasation or pooling to suggest active GI bleeding. 2. Cirrhosis with sequelae of portal hypertension including moderate ascites, large splenorenal shunt, and gastric cardia and paraesophageal varices. 3. Increased moderate right and new small left pleural effusions with complete collapse of the visualized left lower lobe and partial collapse of the right lower lobe. 4.  Aortic atherosclerosis (ICD10-I70.0). Electronically Signed   By: Obie DredgeWilliam T Derry M.D.   On: 01/13/2022 16:44   DG Chest Port 1 View  Result Date: 12/26/2021 CLINICAL DATA:  Intubation.  COPD. EXAM: PORTABLE CHEST 1 VIEW COMPARISON:  12/13/2020 FINDINGS: Endotracheal tube tip is 1.3 cm above the carina. Bibasilar opacities with obscuration of the diaphragms. Blunted right costophrenic angle favoring at least a moderate right pleural effusion. Cannot exclude left pleural effusion although the left basilar appearance could be from atelectasis or pneumonia. The patient is rotated to the left on today's radiograph, reducing diagnostic sensitivity and specificity. Thoracic spondylosis. Probable mild cardiomegaly. Atherosclerotic calcification of the aortic arch. IMPRESSION: 1. At least moderate-sized right pleural effusion. Left  basilar airspace opacity could be from atelectasis, pneumonia, or left effusion. 2. Suspected mild cardiomegaly although cardiac contours difficult to discern due to leftward rotation. 3. Endotracheal tube tip is 1.3 cm above the carina. Although not malpositioned, consider retracting 1 cm for security. Electronically Signed   By: Gaylyn RongWalter  Liebkemann M.D.   On: 01/17/2022 11:53    Labs:  CBC: Recent Labs    01/01/2022 2312 12/30/21 0642 12/30/21 1127 12/31/21 0326  WBC 7.2 8.6 9.3 15.6*  HGB 10.7* 11.6* 11.8* 11.7*  HCT 32.5* 35.1* 36.7 35.7*  PLT 57*  66* 75* 132*    COAGS: Recent Labs    12/21/21 0419 12/25/2021 1047 01/03/2022 1506 12/30/21 0313  INR 2.0* 1.6* 1.7* 1.7*    BMP: Recent Labs    01/06/2022 1047 01/01/2022 1416 12/31/2021 2033 12/30/21 0313 12/30/21 1947 12/31/21 0326  NA 140   < > 144 139 142 141  K 4.1   < > 3.7 3.9 3.7 4.0  CL 111  --   --  110 110 111  CO2 25  --   --  21* 21* 19*  GLUCOSE 191*  --   --  142* 156* 177*  BUN 8  --   --  6* 9 12  CALCIUM 9.1  --   --  8.8* 9.1 9.2  CREATININE 0.67  --   --  0.98 1.07* 1.24*  GFRNONAA >60  --   --  >60 58* 49*   < > = values in this interval not displayed.    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   She is now extubated, alert enough to answer a few questions/follow commands and is able to state she is not in any pain/discomfort. Right IJ dressing removed. Ok to remove right femoral dressing - RN aware. Due to patient positioning I was unable to fully get to this  site.   T. Bili is down to 3.2 today. WBC elevated to 15.6.  Continue lactulose TID. IR recommends to continue following bilirubin levels/liver enzymes until they normalize. IR also recommends repeat Echo given elevated right heart pressures during procedure. Other plans per primary teams. IR will continue to follow.   Electronically Signed: Soyla Dryer, AGACNP-BC [phone] 12/31/2021, 2:54 PM   I spent a total of 15 Minutes at the the patient's bedside AND on the patient's hospital floor or unit, greater than 50% of which was counseling/coordinating care for TIPS

## 2021-12-31 NOTE — Progress Notes (Signed)
eLink Physician-Brief Progress Note Patient Name: Chelsea [name] DOB: [birth year] MRN: [id number]   Date of Service  12/31/2021  HPI/Events of Note  Multiple issues: 1. Oliguria - Poor response to Lasix. Albumin = 2.1. 2. Last Lactic Acid level = 2.7 yesterday morning. Note that the patient's NASH cirrhosis and liver dysfunction may account for the elevated Lactic Acid level.   eICU Interventions  Plan: 25% Albumin 12.5 gm IV now.  Repeat Lactic Acid level now.     Intervention Category Major Interventions: Other:  Rafik Koppel Dennard Nip 12/31/2021, 3:08 AM

## 2021-12-31 NOTE — Consult Note (Addendum)
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 micropuncture technique. A wire was advanced into the IVC and serial fascial dilation performed. A 10 French tips sheath was placed into the internal jugular vein and advanced to the IVC. The jugular sheath was retracted into the right atrium and manometry was performed. A 5 French angled tip catheter was then directed into the right hepatic vein. Hepatic venogram was performed. These images demonstrated patent hepatic vein with no stenosis. The catheter was advanced to a wedge portion of the a patent vein over which the 10 French sheath was advanced into the right hepatic vein. Using ICE ultrasound visualization the catheter as right  hepatic vein as well as the portal anatomy was defined.  A planned exit site from the hepatic vein and puncture site from the portal vein was placed into a single sonographic plane. Under direct ultrasound visualization, the ScorpionX needle was advanced into the central right portal vein. Hand injection of contrast confirmed position within the portal system. A Glidewire Advantage was then advanced into the superior mesenteric vein. A 5 French marking pigtail catheter was then advanced over the wire into the main portal vein and wire removed. Portal venogram was performed which demonstrated a patent portal vein. Multiple prominent varices were seen arising from the main portal vein via the left gastric vein. Portal manometry was then performed. The tract was then dilated to 8 mm with an 8 mm x 8 cm Athletis balloon. A 8-10 mm by 7 + 2 mm of Viatorr was placed. After placement of the shunt, right atrial and portal pressures were repeated. Portal venogram demonstrates a patent TIPS endograft without unchanged gastroesophageal varices. Therefore, the pigtail catheter was exchanged for a Navicross catheter. There was difficulty selecting the left gastric vein due to angulation, therefore the Navicross was exchanged for a C2 catheter which successfully selected the left gastric vein. Venogram was performed which demonstrated prominent gastroesophageal varices without active extravasation, draining in hepatofugal fashion via a splenorenal shunt. The C2 catheter was then exchanged for a Navicross catheter. Total of 3, 10 mm and 2, 13 mm 0.035 "Azur detachable coils were deployed in the central aspect of the left gastric vein. Completion portal venogram was then performed which demonstrated successful embolization of the left gastric vein and brisk antegrade flow through the main portal vein and indwelling TIPS endograft. The catheters and sheath were removed and manual compression was applied to the right internal jugular  and right common femoral venous access sites until hemostasis was achieved. The patient was transferred to the PACU in stable condition. Pre-TIPS Mean Pressures (mmHg): Right atrium: 23 Portal vein: 42 Portosystemic gradient: 19 Post-TIPS Mean Pressures (mmHg): Right atrium:34 Portal vein: 38 Portosystemic gradient: 4 Despite appropriate waveforms and multiple measurements, these pressures were thought to be erroneously elevated. IMPRESSION: 1. Successful transjugular portosystemic shunt creation. 2. Successful coil embolization of left gastric vein. 3. Portosystemic gradient of 19 mm Hg (absolute portal venous pressure 42 mm Hg) before shunt placement and 4 mm Hg (absolute portal venous pressure 38 mm Hg) after shunt placement. PLAN: Patient will be followed closely by the Interventional Radiology team while inpatient, and by the Interventional Radiology Portal Hypertension Clinic after discharge. Ruthann Cancer, MD [facility] Radiology Electronically Signed   By: Ruthann Cancer M.D.   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 Partial control of varices with banding. CT today demonstrates cirrhosis, gastroesophageal varices, splenomegaly, mild ascites, and hepatic hydrothorax. EXAM: 1. Ultrasound-guided access of the right internal jugular vein 2. Ultrasound-guided access of the right common femoral vein 3. Hepatic venogram 4. Intravascular ultrasound 5. Catheterization of the portal vein 6. Portal venous and central manometry 7. Portal venogram 8. Creation of a transhepatic portal vein to hepatic vein shunt 9. Coil embolization of gastroesophageal varices MEDICATIONS: The patient was receiving intravenous Rocephin as an inpatient, administered in appropriate time frame  prior to start of procedure. ANESTHESIA/SEDATION: General - as administered by the Anesthesia department CONTRAST:  One hundred thirty-five ML Omnipaque 300, intravenous  FLUOROSCOPY TIME:  Fluoroscopy Time: 16 minutes, 18 seconds (931 mGy). COMPLICATIONS: None immediate. PROCEDURE: The procedure was performed with the assistance of my associate, Dr. Michaelle Birks. Informed written consent was obtained from the patient's family after a thorough discussion of the procedural risks, benefits and alternatives. All questions were addressed. Maximal Sterile Barrier Technique was utilized including caps, mask, sterile gowns, sterile gloves, sterile drape, hand hygiene and skin antiseptic. A timeout was performed prior to the initiation of the procedure. A preliminary ultrasound of the right groin was performed and demonstrates a patent right common femoral vein. A permanent ultrasound image was recorded. Using a combination of fluoroscopy and ultrasound, an access site was determined. A small dermatotomy was made at the planned puncture site. Using ultrasound guidance, access into the right common femoral vein was obtained with visualization of needle entry into the vessel using a standard micropuncture technique. A wire was advanced into the IVC insert all fascial dilation performed. An 8 [place], 11 cm vascular sheath was placed into the external iliac vein. Through this access site, an 56 [place] ICE catheter was advanced with ease under fluoroscopic guidance to the level of the intrahepatic inferior vena cava. A preliminary ultrasound of the right neck was performed and demonstrates a patent internal jugular vein. A permanent ultrasound image was recorded. Using a combination of fluoroscopy and ultrasound, an access site was determined. A small dermatotomy was made at the planned puncture site. Using ultrasound guidance, access into the right internal jugular vein was obtained with visualization of needle entry into  the vessel using a standard micropuncture technique. A wire was advanced into the IVC and serial fascial dilation performed. A 10 French tips sheath was placed into the internal jugular vein and advanced to the IVC. The jugular sheath was retracted into the right atrium and manometry was performed. A 5 French angled tip catheter was then directed into the right hepatic vein. Hepatic venogram was performed. These images demonstrated patent hepatic vein with no stenosis. The catheter was advanced to a wedge portion of the a patent vein over which the 10 French sheath was advanced into the right hepatic vein. Using ICE ultrasound visualization the catheter as right hepatic vein as well as the portal anatomy was defined. A planned exit site from the hepatic vein and puncture site from the portal vein was placed into a single sonographic plane. Under direct ultrasound visualization, the ScorpionX needle was advanced into the central right portal vein. Hand injection of contrast confirmed position within the portal system. A Glidewire Advantage was then advanced into the superior mesenteric vein. A 5 French marking pigtail catheter was then advanced over the wire into the main portal vein and wire removed. Portal venogram was performed which demonstrated a patent portal vein. Multiple prominent varices were seen arising from the main portal vein via the left gastric vein. Portal manometry was then performed. The tract was then dilated to 8 mm with an 8 mm x 8 cm Athletis balloon. A 8-10 mm by 7 + 2 mm of Viatorr was placed. After placement of the shunt, right atrial and portal pressures were repeated. Portal venogram demonstrates a patent TIPS endograft without unchanged gastroesophageal varices. Therefore, the pigtail catheter was exchanged for a Navicross catheter. There was difficulty selecting the left gastric vein due to angulation, therefore the Navicross was exchanged for a C2 catheter which successfully selected  the left gastric vein. Venogram was performed which demonstrated prominent gastroesophageal varices without active extravasation, draining in  hepatofugal fashion via a splenorenal shunt. The C2 catheter was then exchanged for a Navicross catheter. Total of 3, 10 mm and 2, 13 mm 0.035 "Azur detachable coils were deployed in the central aspect of the left gastric vein. Completion portal venogram was then performed which demonstrated successful embolization of the left gastric vein and brisk antegrade flow through the main portal vein and indwelling TIPS endograft. The catheters and sheath were removed and manual compression was applied to the right internal jugular and right common femoral venous access sites until hemostasis was achieved. The patient was transferred to the PACU in stable condition. Pre-TIPS Mean Pressures (mmHg): Right atrium: 23 Portal vein: 42 Portosystemic gradient: 19 Post-TIPS Mean Pressures (mmHg): Right atrium:34 Portal vein: 38 Portosystemic gradient: 4 Despite appropriate waveforms and multiple measurements, these pressures were thought to be erroneously elevated. IMPRESSION: 1. Successful transjugular portosystemic shunt creation. 2. Successful coil embolization of left gastric vein. 3. Portosystemic gradient of 19 mm Hg (absolute portal venous pressure 42 mm Hg) before shunt placement and 4 mm Hg (absolute portal venous pressure 38 mm Hg) after shunt placement. PLAN: Patient will be followed closely by the Interventional Radiology team while inpatient, and by the Interventional Radiology Portal Hypertension Clinic after discharge. Ruthann Cancer, MD Vascular and Interventional Radiology Specialists [facility] Radiology Electronically Signed   By: Ruthann Cancer M.D.   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 Partial control of varices with banding. CT today demonstrates cirrhosis, gastroesophageal varices, splenomegaly, mild ascites, and hepatic hydrothorax. EXAM: 1. Ultrasound-guided access of the right internal jugular vein 2. Ultrasound-guided access of the right common femoral vein 3. Hepatic venogram 4. Intravascular ultrasound 5. Catheterization of the portal vein 6. Portal venous and central manometry 7. Portal venogram 8. Creation of a transhepatic portal vein to hepatic vein shunt 9. Coil embolization of gastroesophageal varices MEDICATIONS: The patient was receiving intravenous Rocephin as an inpatient, administered in appropriate time frame prior to start of procedure. ANESTHESIA/SEDATION: General - as administered by the Anesthesia department CONTRAST:  One hundred thirty-five ML Omnipaque 300, intravenous FLUOROSCOPY TIME:  Fluoroscopy Time: 16 minutes, 18 seconds (931 mGy). COMPLICATIONS: None immediate. PROCEDURE: The procedure was performed with the assistance of my associate, Dr. Michaelle Birks. Informed written consent was obtained from the patient's family after a thorough discussion of the procedural risks, benefits and alternatives. All questions were addressed. Maximal Sterile Barrier Technique was utilized including caps, mask, sterile gowns, sterile gloves, sterile drape, hand hygiene and skin antiseptic. A timeout was performed prior to the initiation of the procedure. A preliminary ultrasound of the right groin was performed and demonstrates a patent right common femoral vein. A permanent ultrasound image was recorded. Using a combination of fluoroscopy and ultrasound, an access site was determined. A small dermatotomy was made at the planned puncture site. Using ultrasound guidance, access into the right common femoral vein was obtained with visualization of needle entry into the vessel using a standard micropuncture technique. A wire was advanced into the IVC insert all  fascial dilation performed. An 8 [place], 11 cm vascular sheath was placed into the external iliac vein. Through this access site, an 68 [place] ICE catheter was advanced with ease under fluoroscopic guidance to the level of the intrahepatic inferior vena cava. A preliminary ultrasound of the right  neck was performed and demonstrates a patent internal jugular vein. A permanent ultrasound image was recorded. Using a combination of fluoroscopy and ultrasound, an access site was determined. A small dermatotomy was made at the planned puncture site. Using ultrasound guidance, access into the right internal jugular vein was obtained with visualization of needle entry into the vessel using a standard micropuncture technique. A wire was advanced into the IVC and serial fascial dilation performed. A 10 French tips sheath was placed into the internal jugular vein and advanced to the IVC. The jugular sheath was retracted into the right atrium and manometry was performed. A 5 French angled tip catheter was then directed into the right hepatic vein. Hepatic venogram was performed. These images demonstrated patent hepatic vein with no stenosis. The catheter was advanced to a wedge portion of the a patent vein over which the 10 French sheath was advanced into the right hepatic vein. Using ICE ultrasound visualization the catheter as right hepatic vein as well as the portal anatomy was defined. A planned exit site from the hepatic vein and puncture site from the portal vein was placed into a single sonographic plane. Under direct ultrasound visualization, the ScorpionX needle was advanced into the central right portal vein. Hand injection of contrast confirmed position within the portal system. A Glidewire Advantage was then advanced into the superior mesenteric vein. A 5 French marking pigtail catheter was then advanced over the wire into the main portal vein and wire removed. Portal venogram was performed which demonstrated  a patent portal vein. Multiple prominent varices were seen arising from the main portal vein via the left gastric vein. Portal manometry was then performed. The tract was then dilated to 8 mm with an 8 mm x 8 cm Athletis balloon. A 8-10 mm by 7 + 2 mm of Viatorr was placed. After placement of the shunt, right atrial and portal pressures were repeated. Portal venogram demonstrates a patent TIPS endograft without unchanged gastroesophageal varices. Therefore, the pigtail catheter was exchanged for a Navicross catheter. There was difficulty selecting the left gastric vein due to angulation, therefore the Navicross was exchanged for a C2 catheter which successfully selected the left gastric vein. Venogram was performed which demonstrated prominent gastroesophageal varices without active extravasation, draining in hepatofugal fashion via a splenorenal shunt. The C2 catheter was then exchanged for a Navicross catheter. Total of 3, 10 mm and 2, 13 mm 0.035 "Azur detachable coils were deployed in the central aspect of the left gastric vein. Completion portal venogram was then performed which demonstrated successful embolization of the left gastric vein and brisk antegrade flow through the main portal vein and indwelling TIPS endograft. The catheters and sheath were removed and manual compression was applied to the right internal jugular and right common femoral venous access sites until hemostasis was achieved. The patient was transferred to the PACU in stable condition. Pre-TIPS Mean Pressures (mmHg): Right atrium: 23 Portal vein: 42 Portosystemic gradient: 19 Post-TIPS Mean Pressures (mmHg): Right atrium:34 Portal vein: 38 Portosystemic gradient: 4 Despite appropriate waveforms and multiple measurements, these pressures were thought to be erroneously elevated. IMPRESSION: 1. Successful transjugular portosystemic shunt creation. 2. Successful coil embolization of left gastric vein. 3. Portosystemic gradient of 19 mm Hg  (absolute portal venous pressure 42 mm Hg) before shunt placement and 4 mm Hg (absolute portal venous pressure 38 mm Hg) after shunt placement. PLAN: Patient will be followed closely by the Interventional Radiology team while inpatient, and by the Interventional Radiology Portal Hypertension Clinic  after discharge. Ruthann Cancer, MD Vascular and Interventional Radiology Specialists [facility] Radiology Electronically Signed   By: Ruthann Cancer M.D.   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 Partial control of varices with banding. CT today demonstrates cirrhosis, gastroesophageal varices, splenomegaly, mild ascites, and hepatic hydrothorax. EXAM: 1. Ultrasound-guided access of the right internal jugular vein 2. Ultrasound-guided access of the right common femoral vein 3. Hepatic venogram 4. Intravascular ultrasound 5. Catheterization of the portal vein 6. Portal venous and central manometry 7. Portal venogram 8. Creation of a transhepatic portal vein to hepatic vein shunt 9. Coil embolization of gastroesophageal varices MEDICATIONS: The patient was receiving intravenous Rocephin as an inpatient, administered in appropriate time frame prior to start of procedure. ANESTHESIA/SEDATION: General - as administered by the Anesthesia department CONTRAST:  One hundred thirty-five ML Omnipaque 300, intravenous FLUOROSCOPY TIME:  Fluoroscopy Time: 16 minutes, 18 seconds (931 mGy). COMPLICATIONS: None immediate. PROCEDURE: The procedure was performed with the assistance of my associate, Dr. Michaelle Birks. Informed written consent was obtained from the patient's family after a thorough discussion of the procedural risks, benefits and alternatives. All questions were addressed. Maximal Sterile Barrier  Technique was utilized including caps, mask, sterile gowns, sterile gloves, sterile drape, hand hygiene and skin antiseptic. A timeout was performed prior to the initiation of the procedure. A preliminary ultrasound of the right groin was performed and demonstrates a patent right common femoral vein. A permanent ultrasound image was recorded. Using a combination of fluoroscopy and ultrasound, an access site was determined. A small dermatotomy was made at the planned puncture site. Using ultrasound guidance, access into the right common femoral vein was obtained with visualization of needle entry into the vessel using a standard micropuncture technique. A wire was advanced into the IVC insert all fascial dilation performed. An 8 [place], 11 cm vascular sheath was placed into the external iliac vein. Through this access site, an 96 [place] ICE catheter was advanced with ease under fluoroscopic guidance to the level of the intrahepatic inferior vena cava. A preliminary ultrasound of the right neck was performed and demonstrates a patent internal jugular vein. A permanent ultrasound image was recorded. Using a combination of fluoroscopy and ultrasound, an access site was determined. A small dermatotomy was made at the planned puncture site. Using ultrasound guidance, access into the right internal jugular vein was obtained with visualization of needle entry into the vessel using a standard micropuncture technique. A wire was advanced into the IVC and serial fascial dilation performed. A 10 French tips sheath was placed into the internal jugular vein and advanced to the IVC. The jugular sheath was retracted into the right atrium and manometry was performed. A 5 French angled tip catheter was then directed into the right hepatic vein. Hepatic venogram was performed. These images demonstrated patent hepatic vein with no stenosis. The catheter was advanced to a wedge portion of the a patent vein over which the 10  French sheath was advanced into the right hepatic vein. Using ICE ultrasound visualization the catheter as right hepatic vein as well as the portal anatomy was defined. A planned exit site from the hepatic vein and puncture site from the portal vein was placed into a single sonographic plane. Under direct ultrasound visualization, the ScorpionX needle was advanced  into the central right portal vein. Hand injection of contrast confirmed position within the portal system. A Glidewire Advantage was then advanced into the superior mesenteric vein. A 5 French marking pigtail catheter was then advanced over the wire into the main portal vein and wire removed. Portal venogram was performed which demonstrated a patent portal vein. Multiple prominent varices were seen arising from the main portal vein via the left gastric vein. Portal manometry was then performed. The tract was then dilated to 8 mm with an 8 mm x 8 cm Athletis balloon. A 8-10 mm by 7 + 2 mm of Viatorr was placed. After placement of the shunt, right atrial and portal pressures were repeated. Portal venogram demonstrates a patent TIPS endograft without unchanged gastroesophageal varices. Therefore, the pigtail catheter was exchanged for a Navicross catheter. There was difficulty selecting the left gastric vein due to angulation, therefore the Navicross was exchanged for a C2 catheter which successfully selected the left gastric vein. Venogram was performed which demonstrated prominent gastroesophageal varices without active extravasation, draining in hepatofugal fashion via a splenorenal shunt. The C2 catheter was then exchanged for a Navicross catheter. Total of 3, 10 mm and 2, 13 mm 0.035 "Azur detachable coils were deployed in the central aspect of the left gastric vein. Completion portal venogram was then performed which demonstrated successful embolization of the left gastric vein and brisk antegrade flow through the main portal vein and indwelling TIPS  endograft. The catheters and sheath were removed and manual compression was applied to the right internal jugular and right common femoral venous access sites until hemostasis was achieved. The patient was transferred to the PACU in stable condition. Pre-TIPS Mean Pressures (mmHg): Right atrium: 23 Portal vein: 42 Portosystemic gradient: 19 Post-TIPS Mean Pressures (mmHg): Right atrium:34 Portal vein: 38 Portosystemic gradient: 4 Despite appropriate waveforms and multiple measurements, these pressures were thought to be erroneously elevated. IMPRESSION: 1. Successful transjugular portosystemic shunt creation. 2. Successful coil embolization of left gastric vein. 3. Portosystemic gradient of 19 mm Hg (absolute portal venous pressure 42 mm Hg) before shunt placement and 4 mm Hg (absolute portal venous pressure 38 mm Hg) after shunt placement. PLAN: Patient will be followed closely by the Interventional Radiology team while inpatient, and by the Interventional Radiology Portal Hypertension Clinic after discharge. Ruthann Cancer, MD Vascular and Interventional Radiology Specialists [facility] Radiology Electronically Signed   By: Ruthann Cancer M.D.   On: 12/30/2021 14:34   DG Chest Port 1 View  Result Date: 12/30/2021 CLINICAL DATA:  Her story failure EXAM: PORTABLE CHEST 1 VIEW COMPARISON:  CT chest 12/13/2020 FINDINGS: Endotracheal tube with the tip 5.3 cm above the carina. Bilateral interstitial and patchy alveolar airspace opacities, right greater than left. Small bilateral pleural effusions. No pneumothorax. Stable cardiomegaly. No acute osseous abnormality. IMPRESSION: 1. Endotracheal tube with the tip 5.3 cm above the carina. 2. Bilateral interstitial and patchy alveolar airspace opacities, right greater than left, and small bilateral pleural effusions. Differential considerations include pulmonary edema versus multilobar pneumonia. Electronically Signed   By: Kathreen Devoid M.D.   On: 12/30/2021 07:29    ECHOCARDIOGRAM LIMITED  Result Date: 12/24/2021    ECHOCARDIOGRAM LIMITED REPORT   Patient Name:   SHALETTA HINOSTROZA Date of Exam: 01/22/2022 Medical Rec #:  [id number]         Height:       67.0 in Accession #:    [id number]        Weight:  230.0 lb Date of Birth:  12/24/1955         BSA:          2.146 m Patient Age:    7 years          BP:           124/75 mmHg Patient Gender: F                 HR:           78 bpm. Exam Location:  Inpatient Procedure: Limited Echo, Cardiac Doppler and Color Doppler                     STAT ECHO Reported to: Dr Marry Guan on 01/14/2022 4:37:00 PM. Indications:    Abnormal ECG  History:        Patient has prior history of Echocardiogram examinations, most                 recent 11/03/2011. CHF, Arrythmias:Atrial Fibrillation; Risk                 Factors:Diabetes, Hypertension, Sleep Apnea and Dyslipidemia.                 Cirrhosis.  Sonographer:    Clayton Lefort RDCS (AE) Referring Phys: [phone] MATTHEW R HUNSUCKER  Sonographer Comments: Echo performed with patient supine and on artificial respirator. IMPRESSIONS  1. The left ventricle has no regional wall motion abnormalities. Left ventricular diastolic parameters are consistent with Grade II diastolic dysfunction (pseudonormalization).  2. Right ventricular systolic function is normal. The right ventricular size is normal. Tricuspid regurgitation signal is inadequate for assessing PA pressure.  3. Left atrial size was mildly dilated.  4. The mitral valve is grossly normal. Trivial mitral valve regurgitation. No evidence of mitral stenosis.  5. The aortic valve is grossly normal. Aortic valve regurgitation is not visualized. No aortic stenosis is present. FINDINGS  Left Ventricle: The left ventricle has no regional wall motion abnormalities. The left ventricular internal cavity size was normal in size. There is no left ventricular hypertrophy. Left ventricular diastolic parameters are consistent with Grade II diastolic  dysfunction (pseudonormalization). Right Ventricle: The right ventricular size is normal. No increase in right ventricular wall thickness. Right ventricular systolic function is normal. Tricuspid regurgitation signal is inadequate for assessing PA pressure. Left Atrium: Left atrial size was mildly dilated. Right Atrium: Right atrial size was normal in size. Pericardium: Trivial pericardial effusion is present. Mitral Valve: The mitral valve is grossly normal. Mild mitral annular calcification. Trivial mitral valve regurgitation. No evidence of mitral valve stenosis. Tricuspid Valve: The tricuspid valve is grossly normal. Tricuspid valve regurgitation is trivial. No evidence of tricuspid stenosis. Aortic Valve: The aortic valve is grossly normal. Aortic valve regurgitation is not visualized. No aortic stenosis is present. Aortic valve mean gradient measures 6.0 mmHg. Aortic valve peak gradient measures 11.7 mmHg. Aortic valve area, by VTI measures  1.73 cm. Pulmonic Valve: The pulmonic valve was grossly normal. Pulmonic valve regurgitation is not visualized. No evidence of pulmonic stenosis. Aorta: The aortic root and ascending aorta are structurally normal, with no evidence of dilitation. Venous: IVC assessment for right atrial pressure unable to be performed due to mechanical ventilation. IAS/Shunts: The atrial septum is grossly normal. Additional Comments: There is a small pleural effusion in the left lateral region. LEFT VENTRICLE PLAX 2D LVIDd:         4.80 cm   Diastology LVIDs:  3.00 cm   LV e' medial:    5.77 cm/s LV PW:         1.10 cm   LV E/e' medial:  20.3 LV IVS:        1.10 cm   LV e' lateral:   5.11 cm/s LVOT diam:     1.80 cm   LV E/e' lateral: 22.9 LV SV:         68 LV SV Index:   32 LVOT Area:     2.54 cm  IVC IVC diam: 2.40 cm LEFT ATRIUM         Index LA diam:    4.40 cm 2.05 cm/m  AORTIC VALVE AV Area (Vmax):    1.44 cm AV Area (Vmean):   1.50 cm AV Area (VTI):     1.73 cm AV Vmax:            171.00 cm/s AV Vmean:          118.000 cm/s AV VTI:            0.394 m AV Peak Grad:      11.7 mmHg AV Mean Grad:      6.0 mmHg LVOT Vmax:         96.70 cm/s LVOT Vmean:        69.700 cm/s LVOT VTI:          0.268 m LVOT/AV VTI ratio: 0.68  AORTA Ao Root diam: 3.00 cm Ao Asc diam:  3.40 cm MITRAL VALVE MV Area (PHT): 2.86 cm     SHUNTS MV Decel Time: 265 msec     Systemic VTI:  0.27 m MV E velocity: 117.00 cm/s  Systemic Diam: 1.80 cm MV A velocity: 100.00 cm/s MV E/A ratio:  1.17 Eleonore Chiquito MD Electronically signed by Eleonore Chiquito MD Signature Date/Time: 01/10/2022/5:49:02 PM    Final    CT ANGIO GI BLEED  Result Date: 01/22/2022 CLINICAL DATA:  Bleeding esophageal varices. EXAM: CTA ABDOMEN AND PELVIS WITHOUT AND WITH CONTRAST TECHNIQUE: Multidetector CT imaging of the abdomen and pelvis was performed using the standard protocol during bolus administration of intravenous contrast. Multiplanar reconstructed images and MIPs were obtained and reviewed to evaluate the vascular anatomy. RADIATION DOSE REDUCTION: This exam was performed according to the departmental dose-optimization program which includes automated exposure control, adjustment of the mA and/or kV according to patient size and/or use of iterative reconstruction technique. CONTRAST:  134m OMNIPAQUE IOHEXOL 350 MG/ML SOLN COMPARISON:  CT chest dated Dec 13, 2020. CT abdomen pelvis dated October 09, 2017. FINDINGS: VASCULAR Aorta: Normal caliber aorta without aneurysm, dissection, vasculitis or significant stenosis. Mild-to-moderate atherosclerotic calcification. Celiac: Patent without evidence of aneurysm, dissection, vasculitis or significant stenosis. SMA: Patent without evidence of aneurysm, dissection, vasculitis or significant stenosis. Renals: Both renal arteries are patent without evidence of aneurysm, dissection, vasculitis, fibromuscular dysplasia or significant stenosis. IMA: Patent without evidence of aneurysm, dissection,  vasculitis or significant stenosis. Inflow: Patent without evidence of aneurysm, dissection, vasculitis or significant stenosis. Proximal Outflow: Bilateral common femoral and visualized portions of the superficial and profunda femoral arteries are patent without evidence of aneurysm, dissection, vasculitis or significant stenosis. Veins: Patent portal system. Large splenorenal shunt noted. Gastric cardia and paraesophageal varices. Review of the MIP images confirms the above findings. NON-VASCULAR Lower chest: Increased moderate right and new small left pleural effusions. Complete collapse of the visualized left lower lobe. Partial collapse of the right lower lobe. Hepatobiliary: Unchanged nodular liver contour. No focal  liver abnormality. Layering sludge within the gallbladder. No gallbladder wall thickening or biliary dilatation. Pancreas: Unremarkable. No pancreatic ductal dilatation or surrounding inflammatory changes. Spleen: Unchanged moderate splenomegaly.  No focal abnormality. Adrenals/Urinary Tract: Adrenal glands are unremarkable. Small right renal simple cyst inferiorly. No follow-up imaging is recommended. No renal calculi or hydronephrosis. Bladder is decompressed by Foley catheter. Stomach/Bowel: Stomach is within normal limits. Diminutive or absent appendix. No evidence of bowel wall thickening, distention, or inflammatory changes. No intraluminal contrast extravasation or pooling. Lymphatic: No enlarged abdominal or pelvic lymph nodes. Reproductive: Status post hysterectomy. No adnexal masses. Other: Moderate ascites.  No pneumoperitoneum. Musculoskeletal: No acute or significant osseous findings. IMPRESSION: 1. No intraluminal contrast extravasation or pooling to suggest active GI bleeding. 2. Cirrhosis with sequelae of portal hypertension including moderate ascites, large splenorenal shunt, and gastric cardia and paraesophageal varices. 3. Increased moderate right and new small left pleural  effusions with complete collapse of the visualized left lower lobe and partial collapse of the right lower lobe. 4.  Aortic atherosclerosis (ICD10-I70.0). Electronically Signed   By: Titus Dubin M.D.   On: 01/19/2022 16:44   DG Chest Port 1 View  Result Date: 01/16/2022 CLINICAL DATA:  Intubation.  COPD. EXAM: PORTABLE CHEST 1 VIEW COMPARISON:  12/13/2020 FINDINGS: Endotracheal tube tip is 1.3 cm above the carina. Bibasilar opacities with obscuration of the diaphragms. Blunted right costophrenic angle favoring at least a moderate right pleural effusion. Cannot exclude left pleural effusion although the left basilar appearance could be from atelectasis or pneumonia. The patient is rotated to the left on today's radiograph, reducing diagnostic sensitivity and specificity. Thoracic spondylosis. Probable mild cardiomegaly. Atherosclerotic calcification of the aortic arch. IMPRESSION: 1. At least moderate-sized right pleural effusion. Left basilar airspace opacity could be from atelectasis, pneumonia, or left effusion. 2. Suspected mild cardiomegaly although cardiac contours difficult to discern due to leftward rotation. 3. Endotracheal tube tip is 1.3 cm above the carina. Although not malpositioned, consider retracting 1 cm for security. Electronically Signed   By: Van Clines M.D.   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 EGD 6/6 showed grade 3 esophageal varices, one varix ruptured during the EGD resulting in active GI bleeding.  She was intubated for airway protection.  Abdominal/pelvic CT angiogram without evidence of active bleeding. She was admitted to the ICU placed on PPI and Octreotide infusions.  She was transferred to [facility] for emergent TIPS.  S/P TIPS by IR with coil embolization of the left gastric vein 12/27/2021. Admission Hg 9.0 -> today Hg 11.7.  No hematemesis or hematochezia.  On Octreotide  and PPI infusion.  Hemodynamically stable at this time. -Continue to monitor H&H closely -Transfuse for hemoglobin less than 7  -Continue Octreotide and PPI infusions for now -Continue Rocephin 2 g IV every 24 hours -Clear liquid diet -Await further recommendations per Dr. Rush Landmark  2) Encephalopathy, multifactorial. Ammonia levels decreasing. Received Lactulose enema last night, tolerating oral Lactulose today.  -Continue lactulose 10 g p.o. 3 times daily titrate to 2-3 loose BMs daily -Xifaxan 514m one po bid -Monitor neuro status closely as patient is at a higher risk for hepatic encephalopathy s/p TIPS  3) Thrombocytopenia   4) Atrial fibrillation on diltiazem infusion.  Coumadin on hold.  5) COPD  6) History of CVA  CNoralyn Pick 12/31/2021, 12:46PM

## 2021-12-31 NOTE — TOC Initial Note (Signed)
Transition of Care [facility]) - Initial/Assessment Note    Patient Details  Name: ARLY BLEE MRN: [id number] Date of Birth: May 01, 1956  [facility] CM/SW Contact:    Tom-Johnson, Renea Ee, RN Phone Number: 12/31/2021, 2:01 PM  Clinical Narrative:                  CM spoke with patient's sister, Wyneater at bedside about post hospital transition. Admitted for Varices of Esophagus. Was intubated and extubated yesterday, 06/07. Currently on 3 L O2. Had TIPS done yesterday.  Wyneater states patient lives alone. Does not have any children. Has six siblings and supportive. Patient is retired from [employer].    Does not have DME's at home. PCP is [name], MD and [facility] [place].  No needs or recommendations noted at this time. CM will continue to follow as patient progress with care.    Barriers to Discharge: Continued Medical Work up   Patient Goals and CMS Choice Patient states their goals for this hospitalization and ongoing recovery are:: To return home      Expected Discharge Plan and Services     Discharge Planning Services: CM Consult   Living arrangements for the past 2 months: Single Family Home                                      Prior Living Arrangements/Services Living arrangements for the past 2 months: Single Family Home Lives with:: Self Patient language and need for interpreter reviewed:: Yes Do you feel safe going back to the place where you live?: Yes      Need for Family Participation in Patient Care: Yes (Comment) Care giver support system in place?: Yes (comment)   Criminal Activity/Legal Involvement Pertinent to Current Situation/Hospitalization: No - Comment as needed  Activities of Daily Living      Permission Sought/Granted Permission sought to share information with : Case Manager, Family Supports Permission granted to share information with : Yes, Verbal Permission Granted               Emotional Assessment Appearance:: Appears stated age Attitude/Demeanor/Rapport: Engaged, Gracious Affect (typically observed): Accepting, Appropriate, Calm, Hopeful Orientation: : Oriented to Self Alcohol / Substance Use: Not Applicable Psych Involvement: No (comment)  Admission diagnosis:  Varices of esophagus determined by endoscopy ([place]) [I85.00] Acute upper GI bleed [K92.2] Patient Active Problem List   Diagnosis Date Noted   Hypomagnesemia    Acute respiratory failure with hypoxia (HCC)    Encephalopathy acute    Recurrent right pleural effusion    Varices of esophagus determined by endoscopy ([place]) 01/16/2022   Acute upper GI bleed 01/20/2022   Supratherapeutic INR    Acute blood loss anemia 12/16/2021   Lower extremity edema 11/30/2021   Elevated LFTs 11/30/2021   Medical non-compliance 06/01/2021   Liver cirrhosis secondary to NASH ([place]) 08/14/2020   Esophageal varices ([place]) 08/14/2020   Esophageal varices in cirrhosis ([place]) 05/05/2020   Nausea without vomiting 05/05/2020   Sleep apnea 09/10/2011   S/P subdural hematoma evacuation 11/12/2010   Chronic anticoagulation 11/12/2010   Atrial fibrillation, chronic ([place]) 09/04/2009   FINGER PAIN 09/04/2009   Diabetes mellitus ([place]) 08/07/2009   Essential hypertension, benign 08/07/2009   PALPITATIONS 08/07/2009   CHEST PAIN, PRECORDIAL 08/07/2009   PCP:  [name], MD [facility]acy[id number], [place] -  [id number] [place] [id number] Phone: [phone] Fax: [phone]     Social Determinants of Health (SDOH) Interventions    Readmission Risk Interventions     No data to display

## 2021-12-31 NOTE — Progress Notes (Signed)
Patient in respiratory distress and very agitated. Placed patient on bipap for respiratory support.

## 2021-12-31 NOTE — Progress Notes (Signed)
eLink Physician-Brief Progress Note Patient Name: Chelsea [name] DOB: 12/22/1955 MRN: [id number]   Date of Service  12/31/2021  HPI/Events of Note  RT concerned about stridor.   eICU Interventions  Plan: Racemic epinephrine via neb Q 3 hours PRN stridor.     Intervention Category Major Interventions: Other:  Lenell Antu 12/31/2021, 12:35 AM

## 2021-12-31 NOTE — Progress Notes (Signed)
eLink Physician-Brief Progress Note Patient Name: ARIBA LEHNEN DOB: Feb 06, 1956 MRN: [id number]   Date of Service  12/31/2021  HPI/Events of Note  Nursing request for AM CXR,  eICU Interventions  Will order portable CXR.     Intervention Category Major Interventions: Other:  Anetria Harwick Dennard Nip 12/31/2021, 4:16 AM

## 2021-12-31 NOTE — Progress Notes (Signed)
eLink Physician-Brief Progress Note Patient Name: Chelsea [name] DOB: 1955/09/17 MRN: [id number]   Date of Service  12/31/2021  HPI/Events of Note  Episode of increased WOB and anxiety. Now on BiPAP and doing much better. Sat = 98% and RR = 22.  eICU Interventions  Plan: NPO except for sips with meds and chips. Melatonin 3 mg PO at 10 PM X 1 for sleep.     Intervention Category Major Interventions: Other:  Lenell Antu 12/31/2021, 7:47 PM

## 2022-01-01 ENCOUNTER — Inpatient Hospital Stay (HOSPITAL_COMMUNITY): Payer: No Typology Code available for payment source

## 2022-01-01 DIAGNOSIS — I4891 Unspecified atrial fibrillation: Secondary | ICD-10-CM | POA: Diagnosis not present

## 2022-01-01 DIAGNOSIS — J9601 Acute respiratory failure with hypoxia: Secondary | ICD-10-CM | POA: Diagnosis not present

## 2022-01-01 DIAGNOSIS — K7682 Hepatic encephalopathy: Secondary | ICD-10-CM | POA: Diagnosis not present

## 2022-01-01 DIAGNOSIS — G9340 Encephalopathy, unspecified: Secondary | ICD-10-CM | POA: Diagnosis not present

## 2022-01-01 DIAGNOSIS — I8500 Esophageal varices without bleeding: Secondary | ICD-10-CM | POA: Diagnosis not present

## 2022-01-01 DIAGNOSIS — K7460 Unspecified cirrhosis of liver: Secondary | ICD-10-CM | POA: Diagnosis not present

## 2022-01-01 DIAGNOSIS — I8510 Secondary esophageal varices without bleeding: Secondary | ICD-10-CM | POA: Diagnosis not present

## 2022-01-01 DIAGNOSIS — K922 Gastrointestinal hemorrhage, unspecified: Secondary | ICD-10-CM | POA: Diagnosis not present

## 2022-01-01 DIAGNOSIS — Z95828 Presence of other vascular implants and grafts: Secondary | ICD-10-CM | POA: Diagnosis not present

## 2022-01-01 LAB — COMPREHENSIVE METABOLIC PANEL
ALT: 22 U/L (ref 0–44)
AST: 37 U/L (ref 15–41)
Albumin: 2.6 g/dL — ABNORMAL LOW (ref 3.5–5.0)
Alkaline Phosphatase: 69 U/L (ref 38–126)
Anion gap: 6 (ref 5–15)
BUN: 18 mg/dL (ref 8–23)
CO2: 19 mmol/L — ABNORMAL LOW (ref 22–32)
Calcium: 9.6 mg/dL (ref 8.9–10.3)
Chloride: 113 mmol/L — ABNORMAL HIGH (ref 98–111)
Creatinine, Ser: 1.63 mg/dL — ABNORMAL HIGH (ref 0.44–1.00)
GFR, Estimated: 35 mL/min — ABNORMAL LOW (ref >60)
Glucose, Bld: 189 mg/dL — ABNORMAL HIGH (ref 70–99)
Potassium: 3.9 mmol/L (ref 3.5–5.1)
Sodium: 138 mmol/L (ref 135–145)
Total Bilirubin: 3.1 mg/dL — ABNORMAL HIGH (ref 0.3–1.2)
Total Protein: 7.3 g/dL (ref 6.5–8.1)

## 2022-01-01 LAB — GLUCOSE, CAPILLARY
Glucose-Capillary: 207 mg/dL — ABNORMAL HIGH (ref 70–99)
Glucose-Capillary: 185 mg/dL — ABNORMAL HIGH (ref 70–99)
Glucose-Capillary: 179 mg/dL — ABNORMAL HIGH (ref 70–99)
Glucose-Capillary: 173 mg/dL — ABNORMAL HIGH (ref 70–99)
Glucose-Capillary: 204 mg/dL — ABNORMAL HIGH (ref 70–99)
Glucose-Capillary: 206 mg/dL — ABNORMAL HIGH (ref 70–99)

## 2022-01-01 LAB — PROTIME-INR
INR: 1.8 — ABNORMAL HIGH (ref 0.8–1.2)
Prothrombin Time: 20.6 seconds — ABNORMAL HIGH (ref 11.4–15.2)

## 2022-01-01 LAB — MAGNESIUM: Magnesium: 1.9 mg/dL (ref 1.7–2.4)

## 2022-01-01 LAB — CBC
HCT: 34.7 % — ABNORMAL LOW (ref 36.0–46.0)
Hemoglobin: 11 g/dL — ABNORMAL LOW (ref 12.0–15.0)
MCH: 26.1 pg (ref 26.0–34.0)
MCHC: 31.7 g/dL (ref 30.0–36.0)
MCV: 82.2 fL (ref 80.0–100.0)
Platelets: 128 10*3/uL — ABNORMAL LOW (ref 150–400)
RBC: 4.22 MIL/uL (ref 3.87–5.11)
RDW: 24.3 % — ABNORMAL HIGH (ref 11.5–15.5)
WBC: 17.1 10*3/uL — ABNORMAL HIGH (ref 4.0–10.5)
nRBC: 0 % (ref 0.0–0.2)

## 2022-01-01 LAB — PHOSPHORUS: Phosphorus: 3.2 mg/dL (ref 2.5–4.6)

## 2022-01-01 MED ORDER — LACTATED RINGERS IV SOLN: INTRAVENOUS | Status: DC

## 2022-01-01 MED ORDER — PROSOURCE TF PO LIQD
45.0000 mL | Freq: Every day | ORAL | Status: DC
  Administered 2022-01-01 – 2022-01-07 (×7): 45 mL
  Filled 2022-01-01 (×7): qty 45

## 2022-01-01 MED ORDER — LACTULOSE ENEMA
300.0000 mL | Freq: Three times a day (TID) | RECTAL | Status: DC
Start: 2022-01-01 — End: 2022-01-02
  Administered 2022-01-01: 300 mL via RECTAL
  Filled 2022-01-01 (×3): qty 300

## 2022-01-01 MED ORDER — DILTIAZEM HCL ER COATED BEADS 300 MG PO CP24: 300.0000 mg | ORAL_CAPSULE | Freq: Every day | ORAL | Status: DC

## 2022-01-01 MED ORDER — ORAL CARE MOUTH RINSE
15.0000 mL | Freq: Two times a day (BID) | OROMUCOSAL | Status: DC
  Administered 2022-01-02 – 2022-01-05 (×8): 15 mL via OROMUCOSAL

## 2022-01-01 MED ORDER — METOPROLOL TARTRATE 25 MG PO TABS
25.0000 mg | ORAL_TABLET | Freq: Two times a day (BID) | ORAL | Status: DC
  Administered 2022-01-01 – 2022-01-03 (×6): 25 mg via ORAL
  Filled 2022-01-01 (×6): qty 1

## 2022-01-01 MED ORDER — PIPERACILLIN-TAZOBACTAM 3.375 G IVPB 30 MIN
3.3750 g | Freq: Four times a day (QID) | INTRAVENOUS | Status: DC
  Administered 2022-01-01: 3.375 g via INTRAVENOUS
  Filled 2022-01-01 (×2): qty 50

## 2022-01-01 MED ORDER — CHLORHEXIDINE GLUCONATE 0.12 % MT SOLN
15.0000 mL | Freq: Two times a day (BID) | OROMUCOSAL | Status: DC
  Administered 2022-01-01 – 2022-01-06 (×10): 15 mL via OROMUCOSAL
  Filled 2022-01-01 (×9): qty 15

## 2022-01-01 MED ORDER — FUROSEMIDE 10 MG/ML IJ SOLN
40.0000 mg | Freq: Once | INTRAMUSCULAR | Status: AC
  Administered 2022-01-01: 40 mg via INTRAVENOUS
  Filled 2022-01-01: qty 4

## 2022-01-01 MED ORDER — PIPERACILLIN-TAZOBACTAM 3.375 G IVPB
3.3750 g | Freq: Three times a day (TID) | INTRAVENOUS | Status: DC
Start: 2022-01-01 — End: 2022-01-06
  Administered 2022-01-01 – 2022-01-06 (×14): 3.375 g via INTRAVENOUS
  Filled 2022-01-01 (×14): qty 50

## 2022-01-01 MED ORDER — PIPERACILLIN-TAZOBACTAM 3.375 G IVPB
3.3750 g | Freq: Three times a day (TID) | INTRAVENOUS | Status: DC
  Filled 2022-01-01: qty 50

## 2022-01-01 MED ORDER — OSMOLITE 1.5 CAL PO LIQD
1000.0000 mL | ORAL | Status: DC
  Administered 2022-01-01 – 2022-01-07 (×7): 1000 mL
  Filled 2022-01-01 (×8): qty 1000

## 2022-01-01 MED ORDER — DILTIAZEM HCL 60 MG PO TABS
90.0000 mg | ORAL_TABLET | Freq: Four times a day (QID) | ORAL | Status: DC
  Administered 2022-01-01 – 2022-01-03 (×9): 90 mg via ORAL
  Filled 2022-01-01 (×10): qty 2

## 2022-01-01 NOTE — Progress Notes (Signed)
[facility] Progress Note  CC:  S/P TIPS, cirrhosis, anemia     Subjective: She was agitated and short of breath on 4 L nasal cannula.  Physical therapist and RN are at the bedside to assist her out of bed into the chair.  No chest pain.  No nausea or vomiting.  No abdominal pain.  BM x 1 today, no hematochezia or melena. Charge nurse reported patient was unable to swallow oral Lactulose earlier today, developed coughing, concern for near aspiration. Swallow study with speech pathologist ordered.   Objective:  Vital signs in last 24 hours: Temp:  [97.5 F (36.4 C)-98.6 F (37 C)] 98.1 F (36.7 C) (06/09 1128) Pulse Rate:  [91-163] 108 (06/09 1200) Resp:  [14-33] 24 (06/09 1200) BP: (115-161)/(57-134) 145/80 (06/09 0800) SpO2:  [93 %-100 %] 95 % (06/09 1200) Arterial Line BP: (116-193)/(49-79) 156/62 (06/09 1200) Weight:  [110.2 kg] 110.2 kg (06/08 2030) Last BM Date : 12/31/21 General:  Alert, anxious, conversant. Heart: Regular rhythm, no murmur. Pulm: Breath sounds clear, decreased in the bases.  On oxygen 4 L nasal cannula. Abdomen: Obese abdomen, mildly firm throughout the upper abdomen without tenderness.  Positive bowel sounds all 4 quadrants.  No gross ascites. Extremities: Mild edema to the lower extremities. Neurologic:  Alert and  oriented x 4. Grossly normal neurologically. Psych:  Alert and cooperative. Normal mood and affect.  Intake/Output from previous day: 06/08 0701 - 06/09 0700 In: 1765.4 [I.V.:1165.3; IV Piggyback:600.1] Out: 1300 [Urine:1300] Intake/Output this shift: Total I/O In: 138.9 [I.V.:138.9] Out: -   Lab Results: Recent Labs    12/30/21 1127 12/31/21 0326 01/01/22 0811  WBC 9.3 15.6* 17.1*  HGB 11.8* 11.7* 11.0*  HCT 36.7 35.7* 34.7*  PLT 75* 132* 128*   BMET Recent Labs    12/30/21 1947 12/31/21 0326 01/01/22 0811  NA 142 141 138  K 3.7 4.0 3.9  CL 110 111 113*  CO2 21* 19* 19*  GLUCOSE 156* 177* 189*  BUN '9  12 18  ' CREATININE 1.07* 1.24* 1.63*  CALCIUM 9.1 9.2 9.6   LFT Recent Labs    12/31/21 0326 01/01/22 0811  PROT 7.0 7.3  ALBUMIN 2.2* 2.6*  AST 42* 37  ALT 22 22  ALKPHOS 70 69  BILITOT 3.2* 3.1*  BILIDIR 1.3*  --   IBILI 1.9*  --    PT/INR Recent Labs    12/30/21 0313 01/01/22 0811  LABPROT 19.7* 20.6*  INR 1.7* 1.8*   Hepatitis Panel No results for input(s): "HEPBSAG", "HCVAB", "HEPAIGM", "HEPBIGM" in the last 72 hours.  DG CHEST PORT 1 VIEW  Result Date: 12/31/2021 CLINICAL DATA:  Hypoxia, extubated EXAM: PORTABLE CHEST 1 VIEW COMPARISON:  12/30/2021 FINDINGS: Interval extubation. Similar cardiomegaly with diffuse airspace opacities versus edema. Bilateral pleural effusions layering posteriorly again noted. Little change in aeration. No pneumothorax. Trachea midline. Degenerative changes of the spine. Aorta atherosclerotic. IMPRESSION: Stable cardiomegaly with diffuse airspace process and pleural effusions. CHF is favored over severe pneumonia. Electronically Signed   By: Jerilynn Mages.  Shick M.D.   On: 12/31/2021 08:20    Assessment / Plan:  61) 66 year old female with NASH cirrhosis, esophageal varices with UGI bleed. EGD 6/6 showed grade 3 esophageal varices, one varix ruptured during the EGD resulting in active GI bleeding. She was intubated for airway protection.  Abdominal/pelvic CT angiogram without evidence of active bleeding. She was admitted to the ICU placed on PPI and Octreotide infusions.  She was transferred to  [facility] for emergent TIPS.  S/P TIPS by IR with coil embolization of the left gastric vein 01/19/2022. Admission Hg 9.0 -> Hg 11.7 -> today Hg 11.0.  No hematemesis or hematochezia. On Octreotide and PPI infusions x 72 hrs. T. Bili 3.1. Alk phos 69. ASTS 37. ALT 22.  -CBC in am -Transfuse for hemoglobin < 7  -Discontinue Octreotide  -PPI IV BID   2) Encephalopathy, multifactorial. Ammonia levels decreasing.  -Hold oral Lactulose for now, await SLP  results. May need Lactulose enema if unable to take oral Lactulose -Xifaxan 575m one po bid if patient able to swallow  -Monitor neuro status closely as patient is at a higher risk for hepatic encephalopathy s/p TIPS   3) Thrombocytopenia   4) Coagulopathy. INR 1.8.   5) Dysphagia, difficulty swallowing Lactulose today. SLP study ordered, not yet completed. -NPO -Await SLP results   6) Atrial fibrillation on diltiazem infusion.  Coumadin on hold. -Refer to Dr. MDonneta Rombergaddendum 6/8 regarding anticoagulation recommendations   7) AKI. Cr 1.24 -> 1.63.    8) COPD, possible aspiration pneumonia on Zosyn IV   9) History of CVA  Further recommendations per Dr. MRush Landmark   Principal Problem:   Varices of esophagus determined by endoscopy ([facility] Active Problems:   Esophageal varices in cirrhosis ([place]   Acute upper GI bleed   Hypomagnesemia   Acute respiratory failure with hypoxia (HCC)   Encephalopathy acute   Recurrent right pleural effusion     LOS: 3 days   CNoralyn Pick 01/01/2022, 12:19 PM

## 2022-01-01 NOTE — Progress Notes (Signed)
Pt taken off BiPAP and placed on 4L [place] by RT. Pt tolerating well at this time, RN aware, RT will monitor.

## 2022-01-01 NOTE — Progress Notes (Addendum)
NAME:  Chelsea [name], MRN:  [id number], DOB:  Nov 22, 1955, LOS: 3 ADMISSION DATE:  01/06/2022, CONSULTATION DATE:  12/29/2021 REFERRING MD:  Dr. 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  She had admission from 12/16/21 to 12/21/21 at [facility] for Upper GI bleeding.  She was on coumadin for hx of A fib.  She had EGD on 12/29/21 at [facility] for grade 3 varices.  She had difficulty with bleeding.  She required intubation for airway protection during the procedure.  GI d/w IR about TIPS.  Pt transferred to [facility] for further management.  Hx from chart and medical team.  6/6 tx to [facility], underwent TIPS. Tx back to ICU on vent. No further bleeding.  Pertinent  Medical History  Iron deficiency anemia, DM type 2, A fib, NASH with cirrhosis, COPD, CHF, HLD, OSA, CVA, HTN  Significant Hospital Events: Including procedures, antibiotic start and stop dates in addition to other pertinent events   6/06 EGD with varix banding at [facility], intubated, start octreotide/protonix gtt, transfer to [facility] for TIPS 6/7 extubated 6/8 started on cardizem for HTN and A.fib RVR  Interim History / Subjective:  Had tachycardia with AF RVR and hypertension again yesterday afternoon, restarted on Cardizem. Also placed on BiPAP overnight for some increased WOB.   Objective   Blood pressure (!) 151/61, pulse (!) 107, temperature (!) 97.5 F (36.4 C), temperature source Oral, resp. rate 17, height 5\' 7"  (1.702 m), weight 110.2 kg, SpO2 98 %.        Intake/Output Summary (Last 24 hours) at 01/01/2022 0840 Last data filed at 01/01/2022 3729 Gross per 24 hour  Intake 1738.78 ml  Output 1300 ml  Net 438.78 ml    Filed Weights   01/07/2022 0853 12/31/21 2030  Weight: 104.3 kg 110.2 kg    Examination: General: Adult female, resting in bed, in NAD. Neuro: Opens eyes to voice. Follows intermittent commands. BiPAP in place. HEENT:  /AT. Sclerae anicteric. Cardiovascular: IRIR, no M/R/G.  Lungs: Respirations even and unlabored.  CTA bilaterally, No W/R/R. Abdomen: BS x 4, soft, NT/ND.  Musculoskeletal: No gross deformities, no edema.  Skin: Intact, warm, no rashes.   Assessment & Plan:   Upper GI bleeding from esophageal varices in setting of NASH with cirrhosis - s/p TIPS 01/14/2022. At risk for metabolic encephalopathy post TIPS - IR following, appreciate the assistance - Continue octreotide, protonix gtt through 6/9 overnight (at least 72 hours per GI recs, then transition to PPI BID x 5 days and can stop octreotide). - GI following, appreciate the assistance. - Continue Lactulose and Rifaximin. - day 4/5 of rocephin, stop date 6/10  Respiratory insufficiency - s/p intubation for TIPS procedure above. Now s/p extubation 6/7. Hx of COPD, OSA. - BiPAP PRN for increased WOB (started overnight 6/8, can come off this AM 6/9) - Bronchial hygiene - Continue BDs - Mobilize as able  Chronic Rt pleural effusion - present on imaging studies back to May 2022. 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 - Continue cardizem gtt - Continue home Cardizem PO - PO hydralazine added 6/8 - Hold outpt coumadin. - Need cardiology input regarding consideration of holding further coumadin given hx of GI  bleeding versus resumption of anticoagulation and if in favor of resuming AC, then need to wait until 6/10 for IV heparin challenge (after 72 hours from TIPS).  Iron deficiency anemia in setting of Upper GI bleeding. - transfuse for Hb < 7  DM type 2 poorly controlled with hyperglycemia. - SSI - Hold home Metformin  Goals of care.   - DNR  Best Practice (right click and "Reselect all SmartList Selections" daily)   Diet/type: Regular  consistency (see orders) DVT prophylaxis: SCD GI prophylaxis: PPI Lines: N/A Foley:  N/A Code Status:  DNR Last date of multidisciplinary goals of care discussion [x]   CC time: 30 minutes    Montey Hora, [facility] For pager details, please see AMION or use Epic chat  After 1900, please call [place] for cross coverage needs 01/01/2022, 8:40 AM

## 2022-01-01 NOTE — Progress Notes (Signed)
   Inpatient Rehab Admissions Coordinator :  Per therapy recommendations patient was screened for CIR candidacy by Julicia Krieger RN MSN. Patient is not yet at a level to tolerate the intensity required to pursue a CIR admit . Patient may have the potential to progress to become a candidate. The CIR admissions team will follow and monitor for progress and place a Rehab Consult order if felt to be appropriate. Please contact me with any questions.  Talbot Monarch RN MSN Admissions Coordinator [phone]  

## 2022-01-01 NOTE — Progress Notes (Signed)
Initial Nutrition Assessment  DOCUMENTATION CODES:   Obesity unspecified  INTERVENTION:   Initiate tube feeds via Cortrak tube: - Start Osmolite 1.5 @ 20 ml/hr and advance by 10 ml q 6 hours to goal rate of 50 ml/hr (1200 ml/day) - ProSource TF 45 ml daily  Tube feeding regimen at goal rate provides 1840 kcal, 86 grams of protein, and 914 ml of H2O.   NUTRITION DIAGNOSIS:   Inadequate oral intake related to dysphagia as evidenced by NPO status.  GOAL:   Patient will meet greater than or equal to 90% of their needs  MONITOR:   Diet advancement, Labs, Weight trends, TF tolerance, I & O's  REASON FOR ASSESSMENT:   Consult Enteral/tube feeding initiation and management  ASSESSMENT:   66 year old female who presented on 6/06 for EGD for grade 3 varices. Pt experienced difficulty with bleeding and required intubation for airway protection during the procedure. PMH of NASH with cirrhosis, esophageal varices, atrial fibrillation, iron deficiency anemia, T2DM, COPD, CHF, HLD, OSA, CVA, HTN.  06/06 - s/p TIPS, L gastric vein coil embolization 06/07 - extubated 06/08 - full liquid diet, later NPO 06/09 - Cortrak placed (tip gastric)  Discussed pt with RN and during ICU rounds. Pt did not pass SLP evaluation for diet advancement. Order placed for Cortrak per discussion with CCM MD. Consult received for enteral nutrition initiation and management. Abdomen is distended; discussed with RN. Will start tube feeds at trickle rate and slowly advance to goal.  Unable to obtain diet and weight history at this time. Reviewed weight history in chart. Weight trending up over the last year. Pt weighed 98.9 kg on 01/02/21 and current weight is 110.2 kg. Suspect some of weight gain can be attributed to volume status given CHF diagnosis and current findings of mild BLE edema. Unsure of pt's true dry weight at this time.  Medications reviewed and include: SSI q 4 hours, lactulose 20 grams QID, IV  protonix, IV abx  Labs reviewed: creatinine 1.63, WBC 17.1, platelets 128 CBG's: 179-248 x 24 hours  UOP: 1300 ml x 24 hours I/O's: +573 ml since admit  NUTRITION - FOCUSED PHYSICAL EXAM:  Flowsheet Row Most Recent Value  Orbital Region No depletion  Upper Arm Region No depletion  Thoracic and Lumbar Region No depletion  Buccal Region No depletion  Temple Region No depletion  Clavicle Bone Region No depletion  Clavicle and Acromion Bone Region No depletion  Scapular Bone Region No depletion  Dorsal Hand No depletion  Patellar Region No depletion  Anterior Thigh Region No depletion  Posterior Calf Region No depletion  Edema (RD Assessment) Mild  Hair Reviewed  Eyes Reviewed  Mouth Reviewed  Skin Reviewed  Nails Reviewed       Diet Order:   Diet Order             Diet NPO time specified Except for: Sips with Meds, Ice Chips  Diet effective now                   EDUCATION NEEDS:   Not appropriate for education at this time  Skin:  Skin Assessment: Reviewed RN Assessment (incisions)  Last BM:  01/01/22 large type 7  Height:   Ht Readings from Last 1 Encounters:  12-17-21 5\' 7"  (1.702 m)    Weight:   Wt Readings from Last 1 Encounters:  12/31/21 110.2 kg    BMI:  Body mass index is 38.05 kg/m.  Estimated Nutritional Needs:  Kcal:  1800-2000  Protein:  85-100 grams  Fluid:  1.8-2.0 L    Gustavus Bryant, MS, RD, LDN Inpatient Clinical Dietitian Please see AMiON for contact information.

## 2022-01-01 NOTE — Consult Note (Signed)
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 jugular vein 2. Ultrasound-guided access of the right common femoral vein 3. Hepatic venogram 4. Intravascular ultrasound 5. Catheterization of the portal vein 6. Portal venous and central manometry 7. Portal venogram 8. Creation of a transhepatic portal vein to hepatic vein shunt 9. Coil embolization of gastroesophageal varices MEDICATIONS: The patient was receiving intravenous Rocephin as an inpatient, administered in appropriate time frame prior to start of procedure. ANESTHESIA/SEDATION: General - as administered by the Anesthesia department CONTRAST:  One hundred thirty-five ML Omnipaque 300, intravenous FLUOROSCOPY TIME:  Fluoroscopy Time: 16 minutes, 18 seconds (931 mGy). COMPLICATIONS: None immediate. PROCEDURE: The procedure was performed with the assistance of my associate, Dr. Michaelle Birks. Informed written consent was obtained from the  patient's family after a thorough discussion of the procedural risks, benefits and alternatives. All questions were addressed. Maximal Sterile Barrier Technique was utilized including caps, mask, sterile gowns, sterile gloves, sterile drape, hand hygiene and skin antiseptic. A timeout was performed prior to the initiation of the procedure. A preliminary ultrasound of the right groin was performed and demonstrates a patent right common femoral vein. A permanent ultrasound image was recorded. Using a combination of fluoroscopy and ultrasound, an access site was determined. A small dermatotomy was made at the planned puncture site. Using ultrasound guidance, access into the right common femoral vein was obtained with visualization of needle entry into the vessel using a standard micropuncture technique. A wire was advanced into the IVC insert all fascial dilation performed. An 8 [place], 11 cm vascular sheath was placed into the external iliac vein. Through this access site, an 27 [place] ICE catheter was advanced with ease under fluoroscopic guidance to the level of the intrahepatic inferior vena cava. A preliminary ultrasound of the right neck was performed and demonstrates a patent internal jugular vein. A permanent ultrasound image was recorded. Using a combination of fluoroscopy and ultrasound, an access site was determined. A small dermatotomy was made at the planned puncture site. Using ultrasound guidance, access into the right internal jugular vein was obtained with visualization of needle entry into the vessel using a standard micropuncture technique. A wire was advanced into the IVC and serial fascial dilation performed. A 10 French tips sheath was placed into the internal jugular vein and advanced to the IVC. The jugular sheath was retracted into the right atrium and manometry was performed. A 5 French angled tip catheter was then directed into the right hepatic vein. Hepatic venogram was performed.  These images demonstrated patent hepatic vein with no stenosis. The catheter was advanced to a wedge portion of the a patent vein over which the 10 French sheath was advanced into the right hepatic vein. Using ICE ultrasound visualization the catheter as right hepatic vein as well as the portal anatomy was defined. A planned exit site from the hepatic vein and puncture site from the portal vein was placed into a single sonographic plane. Under direct ultrasound visualization, the ScorpionX needle was advanced into the central right portal vein. Hand injection of contrast confirmed position within the portal system. A Glidewire Advantage was then advanced into the superior mesenteric vein. A 5 French marking pigtail catheter was then advanced over the wire into the main portal vein and wire removed. Portal venogram was performed which demonstrated a patent portal vein. Multiple prominent varices were seen arising from the main portal vein via the left gastric vein. Portal manometry was then performed. The tract was then dilated to 8 mm with an 8 mm x 8 cm Athletis balloon. A 8-10 mm by 7 + 2 mm of Viatorr was placed. After placement of the shunt, right atrial and portal pressures were repeated. Portal venogram demonstrates a patent TIPS endograft without unchanged gastroesophageal varices. Therefore, the pigtail catheter was exchanged for a Navicross catheter. There was difficulty selecting the left gastric vein due to angulation, therefore the Navicross was exchanged for a C2 catheter which successfully selected the left gastric vein. Venogram was performed which demonstrated prominent gastroesophageal varices without active extravasation, draining in hepatofugal fashion via  a splenorenal shunt. The C2 catheter was then exchanged for a Navicross catheter. Total of 3, 10 mm and 2, 13 mm 0.035 "[place] detachable coils were deployed in the central aspect of the left gastric vein. Completion portal venogram was then  performed which demonstrated successful embolization of the left gastric vein and brisk antegrade flow through the main portal vein and indwelling TIPS endograft. The catheters and sheath were removed and manual compression was applied to the right internal jugular and right common femoral venous access sites until hemostasis was achieved. The patient was transferred to the PACU in stable condition. Pre-TIPS Mean Pressures (mmHg): Right atrium: 23 Portal vein: 42 Portosystemic gradient: 19 Post-TIPS Mean Pressures (mmHg): Right atrium:34 Portal vein: 38 Portosystemic gradient: 4 Despite appropriate waveforms and multiple measurements, these pressures were thought to be erroneously elevated. IMPRESSION: 1. Successful transjugular portosystemic shunt creation. 2. Successful coil embolization of left gastric vein. 3. Portosystemic gradient of 19 mm Hg (absolute portal venous pressure 42 mm Hg) before shunt placement and 4 mm Hg (absolute portal venous pressure 38 mm Hg) after shunt placement. PLAN: Patient will be followed closely by the Interventional Radiology team while inpatient, and by the Interventional Radiology Portal Hypertension Clinic after discharge. Ruthann Cancer, MD [facility] Radiology Electronically Signed   By: Ruthann Cancer M.D.   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 Partial control of varices with banding. CT today demonstrates cirrhosis, gastroesophageal varices, splenomegaly, mild ascites, and hepatic hydrothorax. EXAM: 1. Ultrasound-guided access of the right internal jugular vein 2. Ultrasound-guided access of the right common femoral vein 3. Hepatic venogram 4. Intravascular ultrasound 5. Catheterization of the portal vein 6. Portal venous and  central manometry 7. Portal venogram 8. Creation of a transhepatic portal vein to hepatic vein shunt 9. Coil embolization of gastroesophageal varices MEDICATIONS: The patient was receiving intravenous Rocephin as an inpatient, administered in appropriate time frame prior to start of procedure. ANESTHESIA/SEDATION: General - as administered by the Anesthesia department CONTRAST:  One hundred thirty-five ML Omnipaque 300, intravenous FLUOROSCOPY TIME:  Fluoroscopy Time: 16 minutes, 18 seconds (931 mGy). COMPLICATIONS: None immediate. PROCEDURE: The procedure was performed with the assistance of my associate, Dr. Michaelle Birks. Informed written consent was obtained from the patient's family after a thorough discussion of the procedural risks, benefits and alternatives. All questions were addressed. Maximal Sterile Barrier Technique was utilized including caps, mask, sterile gowns, sterile gloves, sterile drape, hand hygiene and skin antiseptic. A timeout was performed prior to the initiation of the procedure. A preliminary ultrasound of the right groin was performed and demonstrates a patent right common femoral vein. A permanent ultrasound image was recorded. Using a combination of fluoroscopy and ultrasound, an access site was determined. A small dermatotomy was made at the planned puncture site. Using ultrasound guidance, access into the right common femoral vein was obtained with visualization of needle entry into the vessel using a standard micropuncture technique. A wire was advanced into the IVC insert all fascial dilation performed. An 8 [place], 11 cm vascular sheath was placed into the external iliac vein. Through this access site, an 46 [place] ICE catheter was advanced with ease under fluoroscopic guidance to the level of the intrahepatic inferior vena cava. A preliminary ultrasound of the right neck was performed  and demonstrates a patent internal jugular vein. A permanent ultrasound image was recorded.  Using a combination of fluoroscopy and ultrasound, an access site was determined. A small dermatotomy was made at the planned puncture site. Using ultrasound guidance, access into the right internal jugular vein was obtained with visualization of needle entry into the vessel using a standard micropuncture technique. A wire was advanced into the IVC and serial fascial dilation performed. A 10 French tips sheath was placed into the internal jugular vein and advanced to the IVC. The jugular sheath was retracted into the right atrium and manometry was performed. A 5 French angled tip catheter was then directed into the right hepatic vein. Hepatic venogram was performed. These images demonstrated patent hepatic vein with no stenosis. The catheter was advanced to a wedge portion of the a patent vein over which the 10 French sheath was advanced into the right hepatic vein. Using ICE ultrasound visualization the catheter as right hepatic vein as well as the portal anatomy was defined. A planned exit site from the hepatic vein and puncture site from the portal vein was placed into a single sonographic plane. Under direct ultrasound visualization, the ScorpionX needle was advanced into the central right portal vein. Hand injection of contrast confirmed position within the portal system. A Glidewire Advantage was then advanced into the superior mesenteric vein. A 5 French marking pigtail catheter was then advanced over the wire into the main portal vein and wire removed. Portal venogram was performed which demonstrated a patent portal vein. Multiple prominent varices were seen arising from the main portal vein via the left gastric vein. Portal manometry was then performed. The tract was then dilated to 8 mm with an 8 mm x 8 cm Athletis balloon. A 8-10 mm by 7 + 2 mm of Viatorr was placed. After placement of the shunt, right atrial and portal pressures were repeated. Portal venogram demonstrates a patent TIPS endograft without  unchanged gastroesophageal varices. Therefore, the pigtail catheter was exchanged for a Navicross catheter. There was difficulty selecting the left gastric vein due to angulation, therefore the Navicross was exchanged for a C2 catheter which successfully selected the left gastric vein. Venogram was performed which demonstrated prominent gastroesophageal varices without active extravasation, draining in hepatofugal fashion via a splenorenal shunt. The C2 catheter was then exchanged for a Navicross catheter. Total of 3, 10 mm and 2, 13 mm 0.035 "Azur detachable coils were deployed in the central aspect of the left gastric vein. Completion portal venogram was then performed which demonstrated successful embolization of the left gastric vein and brisk antegrade flow through the main portal vein and indwelling TIPS endograft. The catheters and sheath were removed and manual compression was applied to the right internal jugular and right common femoral venous access sites until hemostasis was achieved. The patient was transferred to the PACU in stable condition. Pre-TIPS Mean Pressures (mmHg): Right atrium: 23 Portal vein: 42 Portosystemic gradient: 19 Post-TIPS Mean Pressures (mmHg): Right atrium:34 Portal vein: 38 Portosystemic gradient: 4 Despite appropriate waveforms and multiple measurements, these pressures were thought to be erroneously elevated. IMPRESSION: 1. Successful transjugular portosystemic shunt creation. 2. Successful coil embolization of left gastric vein. 3. Portosystemic gradient of 19 mm Hg (absolute portal venous pressure 42 mm Hg) before shunt placement and 4 mm Hg (absolute portal venous pressure 38 mm Hg) after shunt placement. PLAN: Patient will be followed closely by the Interventional Radiology team while inpatient, and by the Interventional Radiology Portal Hypertension Clinic after discharge. Dylan  Serafina Royals, MD [facility] Inter[facility] Radiology Special[facility] Radiology  Electronically Signed   By: Ruthann Cancer M.D.   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 Partial control of varices with banding. CT today demonstrates cirrhosis, gastroesophageal varices, splenomegaly, mild ascites, and hepatic hydrothorax. EXAM: 1. Ultrasound-guided access of the right internal jugular vein 2. Ultrasound-guided access of the right common femoral vein 3. Hepatic venogram 4. Intravascular ultrasound 5. Catheterization of the portal vein 6. Portal venous and central manometry 7. Portal venogram 8. Creation of a transhepatic portal vein to hepatic vein shunt 9. Coil embolization of gastroesophageal varices MEDICATIONS: The patient was receiving intravenous Rocephin as an inpatient, administered in appropriate time frame prior to start of procedure. ANESTHESIA/SEDATION: General - as administered by the Anesthesia department CONTRAST:  One hundred thirty-five ML Omnipaque 300, intravenous FLUOROSCOPY TIME:  Fluoroscopy Time: 16 minutes, 18 seconds (931 mGy). COMPLICATIONS: None immediate. PROCEDURE: The procedure was performed with the assistance of my associate, Dr. Michaelle Birks. Informed written consent was obtained from the patient's family after a thorough discussion of the procedural risks, benefits and alternatives. All questions were addressed. Maximal Sterile Barrier Technique was utilized including caps, mask, sterile gowns, sterile gloves, sterile drape, hand hygiene and skin antiseptic. A timeout was performed prior to the initiation of the procedure. A preliminary ultrasound of the right groin was performed and demonstrates a patent right common femoral vein. A permanent ultrasound image was recorded. Using a combination of fluoroscopy and ultrasound, an access site  was determined. A small dermatotomy was made at the planned puncture site. Using ultrasound guidance, access into the right common femoral vein was obtained with visualization of needle entry into the vessel using a standard micropuncture technique. A wire was advanced into the IVC insert all fascial dilation performed. An 8 [place], 11 cm vascular sheath was placed into the external iliac vein. Through this access site, an 31 [place] ICE catheter was advanced with ease under fluoroscopic guidance to the level of the intrahepatic inferior vena cava. A preliminary ultrasound of the right neck was performed and demonstrates a patent internal jugular vein. A permanent ultrasound image was recorded. Using a combination of fluoroscopy and ultrasound, an access site was determined. A small dermatotomy was made at the planned puncture site. Using ultrasound guidance, access into the right internal jugular vein was obtained with visualization of needle entry into the vessel using a standard micropuncture technique. A wire was advanced into the IVC and serial fascial dilation performed. A 10 French tips sheath was placed into the internal jugular vein and advanced to the IVC. The jugular sheath was retracted into the right atrium and manometry was performed. A 5 French angled tip catheter was then directed into the right hepatic vein. Hepatic venogram was performed. These images demonstrated patent hepatic vein with no stenosis. The catheter was advanced to a wedge portion of the a patent vein over which the 10 French sheath was advanced into the right hepatic vein. Using ICE ultrasound visualization the catheter as right hepatic vein as well as the portal anatomy was defined. A planned exit site from the hepatic vein and puncture site from the portal vein was placed into a single sonographic plane. Under direct ultrasound visualization, the ScorpionX needle was advanced into the central  right portal vein. Hand  injection of contrast confirmed position within the portal system. A Glidewire Advantage was then advanced into the superior mesenteric vein. A 5 French marking pigtail catheter was then advanced over the wire into the main portal vein and wire removed. Portal venogram was performed which demonstrated a patent portal vein. Multiple prominent varices were seen arising from the main portal vein via the left gastric vein. Portal manometry was then performed. The tract was then dilated to 8 mm with an 8 mm x 8 cm Athletis balloon. A 8-10 mm by 7 + 2 mm of Viatorr was placed. After placement of the shunt, right atrial and portal pressures were repeated. Portal venogram demonstrates a patent TIPS endograft without unchanged gastroesophageal varices. Therefore, the pigtail catheter was exchanged for a Navicross catheter. There was difficulty selecting the left gastric vein due to angulation, therefore the Navicross was exchanged for a C2 catheter which successfully selected the left gastric vein. Venogram was performed which demonstrated prominent gastroesophageal varices without active extravasation, draining in hepatofugal fashion via a splenorenal shunt. The C2 catheter was then exchanged for a Navicross catheter. Total of 3, 10 mm and 2, 13 mm 0.035 "Azur detachable coils were deployed in the central aspect of the left gastric vein. Completion portal venogram was then performed which demonstrated successful embolization of the left gastric vein and brisk antegrade flow through the main portal vein and indwelling TIPS endograft. The catheters and sheath were removed and manual compression was applied to the right internal jugular and right common femoral venous access sites until hemostasis was achieved. The patient was transferred to the PACU in stable condition. Pre-TIPS Mean Pressures (mmHg): Right atrium: 23 Portal vein: 42 Portosystemic gradient: 19 Post-TIPS Mean Pressures (mmHg): Right atrium:34 Portal vein: 38  Portosystemic gradient: 4 Despite appropriate waveforms and multiple measurements, these pressures were thought to be erroneously elevated. IMPRESSION: 1. Successful transjugular portosystemic shunt creation. 2. Successful coil embolization of left gastric vein. 3. Portosystemic gradient of 19 mm Hg (absolute portal venous pressure 42 mm Hg) before shunt placement and 4 mm Hg (absolute portal venous pressure 38 mm Hg) after shunt placement. PLAN: Patient will be followed closely by the Interventional Radiology team while inpatient, and by the Interventional Radiology Portal Hypertension Clinic after discharge. Ruthann Cancer, MD [facility] Inter[facility] Radiology Electronically Signed   By: Ruthann Cancer M.D.   On: 12/30/2021 14:34   DG Chest Port 1 View  Result Date: 12/30/2021 CLINICAL DATA:  Her story failure EXAM: PORTABLE CHEST 1 VIEW COMPARISON:  CT chest 12/13/2020 FINDINGS: Endotracheal tube with the tip 5.3 cm above the carina. Bilateral interstitial and patchy alveolar airspace opacities, right greater than left. Small bilateral pleural effusions. No pneumothorax. Stable cardiomegaly. No acute osseous abnormality. IMPRESSION: 1. Endotracheal tube with the tip 5.3 cm above the carina. 2. Bilateral interstitial and patchy alveolar airspace opacities, right greater than left, and small bilateral pleural effusions. Differential considerations include pulmonary edema versus multilobar pneumonia. Electronically Signed   By: Kathreen Devoid M.D.   On: 12/30/2021 07:29   ECHOCARDIOGRAM LIMITED  Result Date: 01/14/2022    ECHOCARDIOGRAM LIMITED REPORT   Patient Name:   Chelsea [name] Date of Exam: 01/20/2022 Medical Rec #:  [id number]         Height:       67.0 in Accession #:    [id number]        Weight:       230.0 lb Date  of Birth:  November 21, 1955         BSA:          2.146 m Patient Age:    80 years          BP:           124/75 mmHg Patient Gender: F                 HR:            78 bpm. Exam Location:  Inpatient Procedure: Limited Echo, Cardiac Doppler and Color Doppler                     STAT ECHO Reported to: Dr Marry Guan on 01/13/2022 4:37:00 PM. Indications:    Abnormal ECG  History:        Patient has prior history of Echocardiogram examinations, most                 recent 11/03/2011. CHF, Arrythmias:Atrial Fibrillation; Risk                 Factors:Diabetes, Hypertension, Sleep Apnea and Dyslipidemia.                 Cirrhosis.  Sonographer:    Clayton Lefort RDCS (AE) Referring Phys: [id number] MATTHEW R HUNSUCKER  Sonographer Comments: Echo performed with patient supine and on artificial respirator. IMPRESSIONS  1. The left ventricle has no regional wall motion abnormalities. Left ventricular diastolic parameters are consistent with Grade II diastolic dysfunction (pseudonormalization).  2. Right ventricular systolic function is normal. The right ventricular size is normal. Tricuspid regurgitation signal is inadequate for assessing PA pressure.  3. Left atrial size was mildly dilated.  4. The mitral valve is grossly normal. Trivial mitral valve regurgitation. No evidence of mitral stenosis.  5. The aortic valve is grossly normal. Aortic valve regurgitation is not visualized. No aortic stenosis is present. FINDINGS  Left Ventricle: The left ventricle has no regional wall motion abnormalities. The left ventricular internal cavity size was normal in size. There is no left ventricular hypertrophy. Left ventricular diastolic parameters are consistent with Grade II diastolic dysfunction (pseudonormalization). Right Ventricle: The right ventricular size is normal. No increase in right ventricular wall thickness. Right ventricular systolic function is normal. Tricuspid regurgitation signal is inadequate for assessing PA pressure. Left Atrium: Left atrial size was mildly dilated. Right Atrium: Right atrial size was normal in size. Pericardium: Trivial pericardial effusion is present. Mitral Valve:  The mitral valve is grossly normal. Mild mitral annular calcification. Trivial mitral valve regurgitation. No evidence of mitral valve stenosis. Tricuspid Valve: The tricuspid valve is grossly normal. Tricuspid valve regurgitation is trivial. No evidence of tricuspid stenosis. Aortic Valve: The aortic valve is grossly normal. Aortic valve regurgitation is not visualized. No aortic stenosis is present. Aortic valve mean gradient measures 6.0 mmHg. Aortic valve peak gradient measures 11.7 mmHg. Aortic valve area, by VTI measures  1.73 cm. Pulmonic Valve: The pulmonic valve was grossly normal. Pulmonic valve regurgitation is not visualized. No evidence of pulmonic stenosis. Aorta: The aortic root and ascending aorta are structurally normal, with no evidence of dilitation. Venous: IVC assessment for right atrial pressure unable to be performed due to mechanical ventilation. IAS/Shunts: The atrial septum is grossly normal. Additional Comments: There is a small pleural effusion in the left lateral region. LEFT VENTRICLE PLAX 2D LVIDd:         4.80 cm   Diastology LVIDs:  3.00 cm   LV e' medial:    5.77 cm/s LV PW:         1.10 cm   LV E/e' medial:  20.3 LV IVS:        1.10 cm   LV e' lateral:   5.11 cm/s LVOT diam:     1.80 cm   LV E/e' lateral: 22.9 LV SV:         68 LV SV Index:   32 LVOT Area:     2.54 cm  IVC IVC diam: 2.40 cm LEFT ATRIUM         Index LA diam:    4.40 cm 2.05 cm/m  AORTIC VALVE AV Area (Vmax):    1.44 cm AV Area (Vmean):   1.50 cm AV Area (VTI):     1.73 cm AV Vmax:           171.00 cm/s AV Vmean:          118.000 cm/s AV VTI:            0.394 m AV Peak Grad:      11.7 mmHg AV Mean Grad:      6.0 mmHg LVOT Vmax:         96.70 cm/s LVOT Vmean:        69.700 cm/s LVOT VTI:          0.268 m LVOT/AV VTI ratio: 0.68  AORTA Ao Root diam: 3.00 cm Ao Asc diam:  3.40 cm MITRAL VALVE MV Area (PHT): 2.86 cm     SHUNTS MV Decel Time: 265 msec     Systemic VTI:  0.27 m MV E velocity: 117.00 cm/s   Systemic Diam: 1.80 cm MV A velocity: 100.00 cm/s MV E/A ratio:  1.17 Eleonore Chiquito MD Electronically signed by Eleonore Chiquito MD Signature Date/Time: 01/19/2022/5:49:02 PM    Final    CT ANGIO GI BLEED  Result Date: 01/12/2022 CLINICAL DATA:  Bleeding esophageal varices. EXAM: CTA ABDOMEN AND PELVIS WITHOUT AND WITH CONTRAST TECHNIQUE: Multidetector CT imaging of the abdomen and pelvis was performed using the standard protocol during bolus administration of intravenous contrast. Multiplanar reconstructed images and MIPs were obtained and reviewed to evaluate the vascular anatomy. RADIATION DOSE REDUCTION: This exam was performed according to the departmental dose-optimization program which includes automated exposure control, adjustment of the mA and/or kV according to patient size and/or use of iterative reconstruction technique. CONTRAST:  152m OMNIPAQUE IOHEXOL 350 MG/ML SOLN COMPARISON:  CT chest dated Dec 13, 2020. CT abdomen pelvis dated October 09, 2017. FINDINGS: VASCULAR Aorta: Normal caliber aorta without aneurysm, dissection, vasculitis or significant stenosis. Mild-to-moderate atherosclerotic calcification. Celiac: Patent without evidence of aneurysm, dissection, vasculitis or significant stenosis. SMA: Patent without evidence of aneurysm, dissection, vasculitis or significant stenosis. Renals: Both renal arteries are patent without evidence of aneurysm, dissection, vasculitis, fibromuscular dysplasia or significant stenosis. IMA: Patent without evidence of aneurysm, dissection, vasculitis or significant stenosis. Inflow: Patent without evidence of aneurysm, dissection, vasculitis or significant stenosis. Proximal Outflow: Bilateral common femoral and visualized portions of the superficial and profunda femoral arteries are patent without evidence of aneurysm, dissection, vasculitis or significant stenosis. Veins: Patent portal system. Large splenorenal shunt noted. Gastric cardia and paraesophageal  varices. Review of the MIP images confirms the above findings. NON-VASCULAR Lower chest: Increased moderate right and new small left pleural effusions. Complete collapse of the visualized left lower lobe. Partial collapse of the right lower lobe. Hepatobiliary: Unchanged nodular liver contour. No focal  liver abnormality. Layering sludge within the gallbladder. No gallbladder wall thickening or biliary dilatation. Pancreas: Unremarkable. No pancreatic ductal dilatation or surrounding inflammatory changes. Spleen: Unchanged moderate splenomegaly.  No focal abnormality. Adrenals/Urinary Tract: Adrenal glands are unremarkable. Small right renal simple cyst inferiorly. No follow-up imaging is recommended. No renal calculi or hydronephrosis. Bladder is decompressed by Foley catheter. Stomach/Bowel: Stomach is within normal limits. Diminutive or absent appendix. No evidence of bowel wall thickening, distention, or inflammatory changes. No intraluminal contrast extravasation or pooling. Lymphatic: No enlarged abdominal or pelvic lymph nodes. Reproductive: Status post hysterectomy. No adnexal masses. Other: Moderate ascites.  No pneumoperitoneum. Musculoskeletal: No acute or significant osseous findings. IMPRESSION: 1. No intraluminal contrast extravasation or pooling to suggest active GI bleeding. 2. Cirrhosis with sequelae of portal hypertension including moderate ascites, large splenorenal shunt, and gastric cardia and paraesophageal varices. 3. Increased moderate right and new small left pleural effusions with complete collapse of the visualized left lower lobe and partial collapse of the right lower lobe. 4.  Aortic atherosclerosis (ICD10-I70.0). Electronically Signed   By: Titus Dubin M.D.   On: 01/03/2022 16:44   DG Chest Port 1 View  Result Date: 01/10/2022 CLINICAL DATA:  Intubation.  COPD. EXAM: PORTABLE CHEST 1 VIEW COMPARISON:  12/13/2020 FINDINGS: Endotracheal tube tip is 1.3 cm above the carina.  Bibasilar opacities with obscuration of the diaphragms. Blunted right costophrenic angle favoring at least a moderate right pleural effusion. Cannot exclude left pleural effusion although the left basilar appearance could be from atelectasis or pneumonia. The patient is rotated to the left on today's radiograph, reducing diagnostic sensitivity and specificity. Thoracic spondylosis. Probable mild cardiomegaly. Atherosclerotic calcification of the aortic arch. IMPRESSION: 1. At least moderate-sized right pleural effusion. Left basilar airspace opacity could be from atelectasis, pneumonia, or left effusion. 2. Suspected mild cardiomegaly although cardiac contours difficult to discern due to leftward rotation. 3. Endotracheal tube tip is 1.3 cm above the carina. Although not malpositioned, consider retracting 1 cm for security. Electronically Signed   By: Van Clines M.D.   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 Can consider IV heparin challenge in 72 hours per GI. Will discuss with MD.  - continue diltiazem 366m daily for rate control - she is no longer on flecainide, unsure as to why -may be candidate for Watchman device. MD to see  For questions or updates, please contact CAldenPlease consult [email or web address] for contact info under    Signed,  HNinfa Meeker PA-C  01/01/2022 11:08 AM

## 2022-01-01 NOTE — Procedures (Signed)
Cortrak  Person Inserting Tube:  Reynald Woods D, RD Tube Type:  Cortrak - 43 inches Tube Size:  10 Tube Location:  Left nare Secured by: Bridle Technique Used to Measure Tube Placement:  Marking at nare/corner of mouth Cortrak Secured At:  63 cm  Cortrak Tube Team Note:  Consult received to place a Cortrak feeding tube.   X-ray is required, abdominal x-ray has been ordered by the Cortrak team. Please confirm tube placement before using the Cortrak tube.   If the tube becomes dislodged please keep the tube and contact the Cortrak team at [email or web address] (password [email or web address]) for replacement.  If after hours and replacement cannot be delayed, place a NG tube and confirm placement with an abdominal x-ray.    Dylana Shaw, RD, LDN Clinical Dietitian RD pager # available in AMION  After hours/weekend pager # available in AMION  

## 2022-01-01 NOTE — Progress Notes (Signed)
Pharmacy Antibiotic Note  Chelsea [name] is a 66 y.o. female admitted on 01/18/2022 with aspiration pneumonia.  Pharmacy has been consulted for Zosyn dosing. CrCL  45 ml/min  Plan: Zosyn 3.375g IV q8h (4 hour infusion). Monitor renal function and C&S  Height: 5\' 7"  (170.2 cm) Weight: 110.2 kg (242 lb 15.2 oz) IBW/kg (Calculated) : 61.6  Temp (24hrs), Avg:98.1 F (36.7 C), Min:97.5 F (36.4 C), Max:98.6 F (37 C)  Recent Labs  Lab 12/26/2021 1047 12/31/2021 1506 01/08/2022 2312 12/30/21 0150 12/30/21 0313 12/30/21 0642 12/30/21 1127 12/30/21 1947 12/31/21 0326 01/01/22 0811  WBC 6.4 3.5* 7.2  --   --  8.6 9.3  --  15.6* 17.1*  CREATININE 0.67  --   --   --  0.98  --   --  1.07* 1.24* 1.63*  LATICACIDVEN  --  2.1*  --  2.8*  --  2.7*  --   --  3.2*  --     Estimated Creatinine Clearance: 44.6 mL/min (A) (by C-G formula based on SCr of 1.63 mg/dL (H)).    Allergies  Allergen Reactions   Bee Venom Anaphylaxis   Lisinopril Cough    Antimicrobials this admission: CTX 6/6 >> 6/9 Zosyn 6/9 >>   Thank you for allowing pharmacy to be a part of this patient's care.  Alanda Slim, PharmD, [facility] Clinical Pharmacist Please see AMION for all Pharmacists' Contact Phone Numbers 01/01/2022, 10:20 AM

## 2022-01-01 NOTE — Evaluation (Signed)
Physical Therapy Evaluation Patient Details Name: Chelsea [name] MRN: [id number] DOB: [birth year] Today's Date: 01/01/2022  History of Present Illness  65 yo female who presented to [facility] for EGD of esophageal varices 6/6 with difficulty breathing, intubation and transferred to [facility] with TIPS. Extubated 6/7. PMhx:Iron deficiency anemia, DM type 2, A fib, NASH with cirrhosis, COPD, CHF, HLD, OSA, CVA, HTN  Clinical Impression  Pt eager to attempt to get OOB but limited by fatigue and HR with Afib and rate up to 138 with limited activity. Pt on 4L maintaining SPO2 >92%. Pt with decreased strength, function, mobility and balance who will benefit from acute therapy to maximize mobility and safety. 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 Encouraged chair positioning throughout the day with nursing and will continue to work toward OOB and further mobility.   HR 107-138 Pre session 175/69 Post 162/64 92-96% on 4L       Recommendations for follow up therapy are one component of a multi-disciplinary discharge planning process, led by the attending physician.  Recommendations may be updated based on patient status, additional functional criteria and insurance authorization.  Follow Up Recommendations Acute inpatient rehab (3hours/day)    Assistance Recommended at Discharge Frequent or constant Supervision/Assistance  Patient can return home with the following  A lot of help with walking and/or transfers;A lot of help with bathing/dressing/bathroom;Assistance with cooking/housework;Direct supervision/assist for financial management;Assist for transportation;Help with stairs or ramp for entrance;Direct supervision/assist for medications management    Equipment Recommendations Rolling walker (2 wheels);BSC/3in1;Hospital bed  Recommendations for Other Services       Functional Status Assessment Patient has had a recent decline in their  functional status and demonstrates the ability to make significant improvements in function in a reasonable and predictable amount of time.     Precautions / Restrictions Precautions Precautions: Fall;Other (comment) Precaution Comments: watch HR and BP      Mobility  Bed Mobility Overal bed mobility: Needs Assistance Bed Mobility: Supine to Sit, Sit to Supine     Supine to sit: Mod assist, HOB elevated Sit to supine: Mod assist   General bed mobility comments: mod assist with HOB 20 degrees, rail and increased time to pivot to right side of bed. Return to supine with pt able to control trunk with assist to lift legs to surface    Transfers Overall transfer level: Needs assistance   Transfers: Sit to/from Stand Sit to Stand: Min assist           General transfer comment: min assist with bil knees blocked to rise from elevated bed x 4 trials. pt with decreased stamina and unable to maintain standing. Last trial able to stand long enough to take side steps toward [facility]. Pt denied transfer to chair after initial stand due to fatigue    Ambulation/Gait               General Gait Details: not yet able  Stairs            Wheelchair Mobility    Modified Rankin (Stroke Patients Only)       Balance Overall balance assessment: Needs assistance   Sitting balance-Leahy Scale: Fair Sitting balance - Comments: static sitting with guarding   Standing balance support: Bilateral upper extremity supported Standing balance-Leahy Scale: Poor Standing balance comment: bil Ue support on therapist arms in standing  Pertinent Vitals/Pain Pain Assessment Pain Assessment: No/denies pain    Home Living Family/patient expects to be discharged to:: Private residence Living Arrangements: Alone Available Help at Discharge: Family;Available 24 hours/day Type of Home: House Home Access: Stairs to enter Entrance Stairs-Rails:  None Entrance Stairs-Number of Steps: 2   Home Layout: One level Home Equipment: None Additional Comments: pt is a retired Marine scientist from [place] Prior Level of Function : Independent/Modified Independent                     Journalist, newspaper        Extremity/Trunk Assessment   Upper Extremity Assessment Upper Extremity Assessment: RUE deficits/detail;Generalized weakness RUE Deficits / Details: pt with generalized weakness with decreased fine motor and unable to maintain grip on washcloth to place on face    Lower Extremity Assessment Lower Extremity Assessment: Generalized weakness    Cervical / Trunk Assessment Cervical / Trunk Assessment: Kyphotic;Other exceptions Cervical / Trunk Exceptions: increased body habitus  Communication   Communication: No difficulties  Cognition Arousal/Alertness: Awake/alert Behavior During Therapy: Flat affect, Anxious Overall Cognitive Status: Impaired/Different from baseline Area of Impairment: Following commands, Safety/judgement, Problem solving                       Following Commands: Follows one step commands inconsistently, Follows one step commands with increased time Safety/Judgement: Decreased awareness of safety, Decreased awareness of deficits   Problem Solving: Slow processing, Requires verbal cues, Requires tactile cues General Comments: Pt reports being anxious, agitated and hot. pt able to provide home setup and PLOf with sister present to confirm. Pt with difficulty maintaining eyes open and following commands throughout        General Comments      Exercises     Assessment/Plan    PT Assessment Patient needs continued PT services  PT Problem List Decreased strength;Decreased mobility;Decreased activity tolerance;Decreased balance;Decreased knowledge of use of DME;Cardiopulmonary status limiting activity       PT Treatment Interventions Gait training;Balance training;Functional  mobility training;Therapeutic activities;Patient/family education;DME instruction;Stair training;Therapeutic exercise;Cognitive remediation    PT Goals (Current goals can be found in the Care Plan section)  Acute Rehab PT Goals Patient Stated Goal: return home and read PT Goal Formulation: With patient Time For Goal Achievement: 2022/01/20 Potential to Achieve Goals: Fair    Frequency Min 3X/week     Co-evaluation               AM-PAC PT "6 Clicks" Mobility  Outcome Measure Help needed turning from your back to your side while in a flat bed without using bedrails?: A Little Help needed moving from lying on your back to sitting on the side of a flat bed without using bedrails?: A Lot Help needed moving to and from a bed to a chair (including a wheelchair)?: A Lot Help needed standing up from a chair using your arms (e.g., wheelchair or bedside chair)?: A Little Help needed to walk in hospital room?: Total Help needed climbing 3-5 steps with a railing? : Total 6 Click Score: 12    End of Session Equipment Utilized During Treatment: Gait belt;Oxygen Activity Tolerance: Patient tolerated treatment well Patient left: in bed;with call bell/phone within reach;with bed alarm set Nurse Communication: Mobility status PT Visit Diagnosis: Other abnormalities of gait and mobility (R26.89);Difficulty in walking, not elsewhere classified (R26.2)    Time: [id number] PT Time Calculation (min) (ACUTE ONLY): 33 min   Charges:  PT Evaluation $PT Eval Moderate Complexity: 1 Mod PT Treatments $Therapeutic Activity: 8-22 mins        Bayard Males, PT Acute Rehabilitation Services Office: [phone]   Shondell Fabel B Mellody Masri 01/01/2022, 1:32 PM

## 2022-01-01 NOTE — Progress Notes (Signed)
Speech Language Pathology Treatment: Dysphagia  Patient Details Name: Chelsea [name] MRN: [id number] DOB: 11/08/1955 Today's Date: 01/01/2022 Time: [id number] SLP Time Calculation (min) (ACUTE ONLY): 13 min  Assessment / Plan / Recommendation Clinical Impression  Pt is inattentive, needs cues to look at cup and hold it to take straw sips. Pt not interested in PO, minimal intake today. Pt has delayed throat clearing and RN and family reports she appears distressed, more uncomfortable after POs. Nightshift unable to safely given medication and today RN could not give lactulose without concern. Recommend Cortrak until pts mentation and strength are more consistent. Pt can have ice chips for comfort in the meantime. Will f/u for diet advancement.   HPI HPI: Patient is a 66 y.o. 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 She had recent admisison 5/24 to 12/21/21 at [facility] for upper GI bleeding and had EGD on 6/6 at [facility] for grade 3 varices but had difficulty with bleeding and required intubation for airway protection during the procedure and transferred to [facility] for IR to assess TIPS.      SLP Plan  Continue with current plan of care      Recommendations for follow up therapy are one component of a multi-disciplinary discharge planning process, led by the attending physician.  Recommendations may be updated based on patient status, additional functional criteria and insurance authorization.    Recommendations  Diet recommendations: NPO Medication Administration: Via alternative means Compensations: Minimize environmental distractions;Slow rate;Small sips/bites Postural Changes and/or Swallow Maneuvers: Seated upright 90 degrees                Oral Care Recommendations: Oral care BID Follow Up Recommendations: Skilled nursing-short term rehab (<3 hours/day) Assistance recommended at discharge: Set up Chelsea [name]: Continue with current plan of  care           Chelsea [name], Chelsea [name]  01/01/2022, 1:53 PM

## 2022-01-02 DIAGNOSIS — K7460 Unspecified cirrhosis of liver: Secondary | ICD-10-CM | POA: Diagnosis not present

## 2022-01-02 DIAGNOSIS — Z95828 Presence of other vascular implants and grafts: Secondary | ICD-10-CM | POA: Diagnosis not present

## 2022-01-02 DIAGNOSIS — N289 Disorder of kidney and ureter, unspecified: Secondary | ICD-10-CM | POA: Diagnosis not present

## 2022-01-02 DIAGNOSIS — K922 Gastrointestinal hemorrhage, unspecified: Secondary | ICD-10-CM | POA: Diagnosis not present

## 2022-01-02 DIAGNOSIS — I480 Paroxysmal atrial fibrillation: Secondary | ICD-10-CM

## 2022-01-02 DIAGNOSIS — K7682 Hepatic encephalopathy: Secondary | ICD-10-CM | POA: Diagnosis not present

## 2022-01-02 DIAGNOSIS — J90 Pleural effusion, not elsewhere classified: Secondary | ICD-10-CM

## 2022-01-02 DIAGNOSIS — J9601 Acute respiratory failure with hypoxia: Secondary | ICD-10-CM | POA: Diagnosis not present

## 2022-01-02 LAB — PHOSPHORUS: Phosphorus: 4.5 mg/dL (ref 2.5–4.6)

## 2022-01-02 LAB — GLUCOSE, CAPILLARY
Glucose-Capillary: 259 mg/dL — ABNORMAL HIGH (ref 70–99)
Glucose-Capillary: 239 mg/dL — ABNORMAL HIGH (ref 70–99)
Glucose-Capillary: 257 mg/dL — ABNORMAL HIGH (ref 70–99)
Glucose-Capillary: 261 mg/dL — ABNORMAL HIGH (ref 70–99)
Glucose-Capillary: 313 mg/dL — ABNORMAL HIGH (ref 70–99)
Glucose-Capillary: 324 mg/dL — ABNORMAL HIGH (ref 70–99)

## 2022-01-02 LAB — CBC
HCT: 34.3 % — ABNORMAL LOW (ref 36.0–46.0)
Hemoglobin: 10.7 g/dL — ABNORMAL LOW (ref 12.0–15.0)
MCH: 25.5 pg — ABNORMAL LOW (ref 26.0–34.0)
MCHC: 31.2 g/dL (ref 30.0–36.0)
MCV: 81.7 fL (ref 80.0–100.0)
Platelets: 155 10*3/uL (ref 150–400)
RBC: 4.2 MIL/uL (ref 3.87–5.11)
RDW: 25 % — ABNORMAL HIGH (ref 11.5–15.5)
WBC: 15.5 10*3/uL — ABNORMAL HIGH (ref 4.0–10.5)
nRBC: 0 % (ref 0.0–0.2)

## 2022-01-02 LAB — BASIC METABOLIC PANEL
Anion gap: 8 (ref 5–15)
BUN: 28 mg/dL — ABNORMAL HIGH (ref 8–23)
CO2: 20 mmol/L — ABNORMAL LOW (ref 22–32)
Calcium: 10.1 mg/dL (ref 8.9–10.3)
Chloride: 111 mmol/L (ref 98–111)
Creatinine, Ser: 1.97 mg/dL — ABNORMAL HIGH (ref 0.44–1.00)
GFR, Estimated: 28 mL/min — ABNORMAL LOW (ref >60)
Glucose, Bld: 280 mg/dL — ABNORMAL HIGH (ref 70–99)
Potassium: 4.2 mmol/L (ref 3.5–5.1)
Sodium: 139 mmol/L (ref 135–145)

## 2022-01-02 LAB — SODIUM, URINE, RANDOM: Sodium, Ur: 20 mmol/L

## 2022-01-02 LAB — BRAIN NATRIURETIC PEPTIDE: B Natriuretic Peptide: 619.5 pg/mL — ABNORMAL HIGH (ref 0.0–100.0)

## 2022-01-02 LAB — MAGNESIUM: Magnesium: 2.1 mg/dL (ref 1.7–2.4)

## 2022-01-02 MED ORDER — ALBUMIN HUMAN 25 % IV SOLN
12.5000 g | Freq: Once | INTRAVENOUS | Status: AC
Start: 2022-01-02 — End: 2022-01-02
  Administered 2022-01-02: 12.5 g via INTRAVENOUS
  Filled 2022-01-02: qty 50

## 2022-01-02 MED ORDER — INSULIN GLARGINE-YFGN 100 UNIT/ML ~~LOC~~ SOLN
10.0000 [IU] | Freq: Two times a day (BID) | SUBCUTANEOUS | Status: DC
  Administered 2022-01-02 (×2): 10 [IU] via SUBCUTANEOUS
  Filled 2022-01-02 (×4): qty 0.1

## 2022-01-02 MED ORDER — LIP MEDEX EX OINT
TOPICAL_OINTMENT | CUTANEOUS | Status: DC | PRN
Start: 2022-01-02 — End: 2022-01-15
  Filled 2022-01-02: qty 7

## 2022-01-02 MED ORDER — FUROSEMIDE 10 MG/ML IJ SOLN
60.0000 mg | Freq: Once | INTRAMUSCULAR | Status: AC
  Administered 2022-01-02: 60 mg via INTRAVENOUS
  Filled 2022-01-02: qty 6

## 2022-01-02 NOTE — Evaluation (Signed)
Occupational Therapy Evaluation Patient Details Name: Chelsea [name] MRN: [id number] DOB: [birth year] Today's Date: 01/02/2022   History of Present Illness 66 yo female who presented to [facility] for EGD of esophageal varices 6/6 with difficulty breathing, intubation and transferred to [facility] with TIPS. Extubated 6/7. PMhx:Iron deficiency anemia, DM type 2, A fib, NASH with cirrhosis, COPD, CHF, HLD, OSA, CVA, HTN   Clinical Impression   Pt admitted for concerns listed above. PTA pt's family reported that she was independent with all ADL's and IADLs', including driving, cooking, and cleaning. At this time, she is requiring mod-max A +1-2 for all BADL's and functional mobility. Pt is limited by cognitive deficits, balance deficits, weakness, and decreased activity tolerance. Recommending SNF at this time, however if pt endurance improves, pt may qualify for AIR. OT will continue to follow acutely.      Recommendations for follow up therapy are one component of a multi-disciplinary discharge planning process, led by the attending physician.  Recommendations may be updated based on patient status, additional functional criteria and insurance authorization.   Follow Up Recommendations  Skilled nursing-short term rehab (<3 hours/day)    Assistance Recommended at Discharge Frequent or constant Supervision/Assistance  Patient can return home with the following Two people to help with walking and/or transfers;Two people to help with bathing/dressing/bathroom;Assistance with cooking/housework;Direct supervision/assist for medications management;Direct supervision/assist for financial management;Assist for transportation;Help with stairs or ramp for entrance    Functional Status Assessment  Patient has had a recent decline in their functional status and demonstrates the ability to make significant improvements in function in a reasonable and predictable amount of time.  Equipment Recommendations  Other  (comment) (TBD)    Recommendations for Other Services       Precautions / Restrictions Precautions Precautions: Fall;Other (comment) Precaution Comments: watch HR and BP Restrictions Weight Bearing Restrictions: No      Mobility Bed Mobility Overal bed mobility: Needs Assistance Bed Mobility: Supine to Sit, Sit to Supine     Supine to sit: Mod assist, HOB elevated Sit to supine: Mod assist   General bed mobility comments: Mod A for trunkal control and BLE managment    Transfers                   General transfer comment: deferred      Balance Overall balance assessment: Needs assistance Sitting-balance support: Bilateral upper extremity supported, Feet supported Sitting balance-Leahy Scale: Poor                                     ADL either performed or assessed with clinical judgement   ADL Overall ADL's : Needs assistance/impaired Eating/Feeding: Moderate assistance;With adaptive utensils;Sitting   Grooming: Moderate assistance;Sitting   Upper Body Bathing: Moderate assistance;Sitting   Lower Body Bathing: Maximal assistance;Sitting/lateral leans;Sit to/from stand   Upper Body Dressing : Minimal assistance;Sitting   Lower Body Dressing: Maximal assistance;Sitting/lateral leans;Sit to/from stand   Toilet Transfer: Maximal assistance;+2 for safety/equipment;+2 for physical assistance;Stand-pivot   Toileting- Clothing Manipulation and Hygiene: Maximal assistance;Sitting/lateral lean;Sit to/from stand;+2 for safety/equipment         General ADL Comments: Pt remaining bed level due to weakness, balance deficits, and decreased activity tolerance.     Vision Baseline Vision/History: 1 Wears glasses Ability to See in Adequate Light: 0 Adequate Patient Visual Report: No change from baseline Vision Assessment?: No apparent visual deficits     Perception  Praxis      Pertinent Vitals/Pain Pain Assessment Pain Assessment:  No/denies pain     Hand Dominance Right   Extremity/Trunk Assessment Upper Extremity Assessment Upper Extremity Assessment: RUE deficits/detail;Generalized weakness RUE Deficits / Details: pt with generalized weakness with decreased fine motor and unable to maintain grip on washcloth to place on face   Lower Extremity Assessment Lower Extremity Assessment: Defer to PT evaluation   Cervical / Trunk Assessment Cervical / Trunk Assessment: Kyphotic;Other exceptions Cervical / Trunk Exceptions: increased body habitus   Communication Communication Communication: No difficulties   Cognition Arousal/Alertness: Awake/alert Behavior During Therapy: Flat affect, Anxious Overall Cognitive Status: Impaired/Different from baseline Area of Impairment: Following commands, Safety/judgement, Problem solving                       Following Commands: Follows one step commands inconsistently, Follows one step commands with increased time Safety/Judgement: Decreased awareness of safety, Decreased awareness of deficits   Problem Solving: Slow processing, Requires verbal cues, Requires tactile cues General Comments: Pt minimally verbally responding this session, however following most simple commands.     General Comments  VSS on 2L    Exercises     Shoulder Instructions      Home Living Family/patient expects to be discharged to:: Private residence Living Arrangements: Alone Available Help at Discharge: Family;Available 24 hours/day Type of Home: House Home Access: Stairs to enter Entergy CorporationEntrance Stairs-Number of Steps: 2 Entrance Stairs-Rails: None Home Layout: One level     Bathroom Shower/Tub: Producer, television/film/videoWalk-in shower   Bathroom Toilet: Handicapped height     Home Equipment: None   Additional Comments: pt is a retired Engineer, civil (consulting)nurse from [employer]      Prior Functioning/Environment Prior Level of Function : Independent/Modified Independent                        OT Problem List:  Decreased strength;Decreased activity tolerance;Impaired balance (sitting and/or standing);Decreased cognition;Decreased safety awareness;Cardiopulmonary status limiting activity;Obesity;Impaired UE functional use      OT Treatment/Interventions: Self-care/ADL training;Therapeutic exercise;Energy conservation;DME and/or AE instruction;Therapeutic activities;Cognitive remediation/compensation;Patient/family education;Balance training    OT Goals(Current goals can be found in the care plan section) Acute Rehab OT Goals Patient Stated Goal: None stated OT Goal Formulation: With patient Time For Goal Achievement: 01/16/22 Potential to Achieve Goals: Good ADL Goals Pt Will Perform Grooming: with modified independence;standing Pt Will Perform Lower Body Bathing: with min assist;with adaptive equipment;sitting/lateral leans;sit to/from stand Pt Will Perform Lower Body Dressing: with min assist;sitting/lateral leans;sit to/from stand;with adaptive equipment Pt Will Transfer to Toilet: with min assist;ambulating Pt Will Perform Toileting - Clothing Manipulation and hygiene: with min assist;with adaptive equipment;sitting/lateral leans;sit to/from stand  OT Frequency: Min 2X/week    Co-evaluation              AM-PAC OT "6 Clicks" Daily Activity     Outcome Measure Help from another person eating meals?: A Little Help from another person taking care of personal grooming?: A Little Help from another person toileting, which includes using toliet, bedpan, or urinal?: A Lot Help from another person bathing (including washing, rinsing, drying)?: A Lot Help from another person to put on and taking off regular upper body clothing?: A Little Help from another person to put on and taking off regular lower body clothing?: A Lot 6 Click Score: 15   End of Session Equipment Utilized During Treatment: Oxygen Nurse Communication: Mobility status  Activity Tolerance: Patient tolerated treatment  well Patient left: in bed;with call bell/phone within reach;with bed alarm set;with family/visitor present  OT Visit Diagnosis: Unsteadiness on feet (R26.81);Other abnormalities of gait and mobility (R26.89);Muscle weakness (generalized) (M62.81)                Time: 4076-8088 OT Time Calculation (min): 24 min Charges:  OT General Charges $OT Visit: 1 Visit OT Evaluation $OT Eval Moderate Complexity: 1 Mod OT Treatments $Self Care/Home Management : 8-22 mins  Karlee Staff H., OTR/L Acute Rehabilitation  Geralda Baumgardner Elane Yakir Wenke 01/02/2022, 7:44 PM

## 2022-01-02 NOTE — Progress Notes (Signed)
eLink Physician-Brief Progress Note Patient Name: Chelsea [name] DOB: 03/05/1956 MRN: [id number]   Date of Service  01/02/2022  HPI/Events of Note  Patient is oliguric, it is unclear what her volume status is at this point as her creatinine is rising and her Lasix is on hold due to concern for over-diuresis.  eICU Interventions  Will order a BNP to try to indirectly gauge her volume status.        Thomasene Lot Shakiyla Kook 01/02/2022, 6:19 AM

## 2022-01-02 NOTE — Progress Notes (Signed)
Pt taken off BiPAP by RT and placed on 2L [place]. Pt tolerating well at this time, MD aware, RN at bedside, RT will monitor.

## 2022-01-02 NOTE — Progress Notes (Signed)
[facility] Progress Note  CC:  Cirrhosis s/p TIPS, anemia  Subjective:  A little more awake today.  Opens her eyes and says hello.  She is now receiving her Lactulose through her Cortrak.  Had a very large BM overnight and another one during the day today so they held her 2PM dose of lactulose.  Objective:  Vital signs in last 24 hours: Temp:  [96 F (35.6 C)-97.9 F (36.6 C)] 97.6 F (36.4 C) (06/10 1200) Pulse Rate:  [60-121] 69 (06/10 1100) Resp:  [12-27] 15 (06/10 1100) BP: (128-143)/(67-84) 143/67 (06/10 0349) SpO2:  [93 %-100 %] 98 % (06/10 1155) Arterial Line BP: (96-177)/(56-80) 151/65 (06/10 1100) FiO2 (%):  [40 %] 40 % (06/10 0349) Weight:  [112.2 kg] 112.2 kg (06/10 0500) Last BM Date : 01/01/22 General:  Alert but sleepy.  Chronically ill-appearing. Heart:  Regular rate and rhythm; no murmurs Pulm:  CTAB.  No W/R/R. Abdomen:  Soft, obese abdomen.  BS present.  Non-tender. Extremities:  Mild LE edema. Neurologic:  Alert and oriented x 4;  grossly normal neurologically. Psych:  Alert and cooperative. Normal mood and affect.  Intake/Output from previous day: 06/09 0701 - 06/10 0700 In: 705.8 [I.V.:311; NG/GT:294.8; IV Piggyback:100] Out: 300 [Urine:300]  Lab Results: Recent Labs    12/31/21 0326 01/01/22 0811 01/02/22 0402  WBC 15.6* 17.1* 15.5*  HGB 11.7* 11.0* 10.7*  HCT 35.7* 34.7* 34.3*  PLT 132* 128* 155   BMET Recent Labs    12/31/21 0326 01/01/22 0811 01/02/22 0402  NA 141 138 139  K 4.0 3.9 4.2  CL 111 113* 111  CO2 19* 19* 20*  GLUCOSE 177* 189* 280*  BUN 12 18 28*  CREATININE 1.24* 1.63* 1.97*  CALCIUM 9.2 9.6 10.1   LFT Recent Labs    12/31/21 0326 01/01/22 0811  PROT 7.0 7.3  ALBUMIN 2.2* 2.6*  AST 42* 37  ALT 22 22  ALKPHOS 70 69  BILITOT 3.2* 3.1*  BILIDIR 1.3*  --   IBILI 1.9*  --    PT/INR Recent Labs    01/01/22 0811  LABPROT 20.6*  INR 1.8*   DG Abd Portable 1V  Result Date:  01/01/2022 CLINICAL DATA:  Feeding tube placement EXAM: PORTABLE ABDOMEN - 1 VIEW COMPARISON:  None Available. FINDINGS: Tip of feeding tube is noted at the junction of body and antrum of the stomach. Bowel gas pattern in the upper abdomen is unremarkable. IMPRESSION: Tip of feeding tube is seen in the stomach. Electronically Signed   By: Elmer Picker M.D.   On: 01/01/2022 15:35    Assessment / Plan: 66) 66 year old female with NASH cirrhosis, esophageal varices with UGI bleed. EGD 6/6 showed grade 3 esophageal varices, one varix ruptured during the EGD resulting in active GI bleeding. She was intubated for airway protection.  Abdominal/pelvic CT angiogram without evidence of active bleeding. She was admitted to the ICU placed on PPI and Octreotide infusions.  She was transferred to [facility] for emergent TIPS.  S/P TIPS by IR with coil embolization of the left gastric vein 12/25/2021. Admission Hg 9.0 -> Hg 11.7 -> Hg 11.0->10.7 grams today.  No hematemesis or hematochezia. Received Octreotide and PPI infusions x 72 hrs. Now on pantoprazole 40 mg IV BID.  -Monitor Hgb.  Transfuse for hemoglobin < 7  -PPI IV BID   2) Encephalopathy, multifactorial. Ammonia levels decreasing.  -Receiving lactulose via Cortrak.  Lactulose titrated to 2-3 good, soft BMs  daily. -Xifaxan 550mg  one po bid if patient able to swallow  -Monitor neuro status closely as patient is at a higher risk for hepatic encephalopathy s/p TIPS   3) Thrombocytopenia:  Improved with normal count today.   4) Coagulopathy. INR 1.8.    5) Dysphagia, difficulty swallowing Lactulose today. SLP study ordered, and recommended NPO.  Cortrak was placed.   6) Atrial fibrillation on diltiazem infusion.  Coumadin on hold. -Refer to Dr. Donneta Romberg addendum 6/8 regarding anticoagulation recommendations.    7) AKI. Cr 1.24 -> 1.63->1.97.  -Due to worsening Cr I have ordered a urine sodium.   8) COPD, possible aspiration pneumonia on  Zosyn IV   9) History of CVA    LOS: 4 days   Keelin Sheridan D. Zaley Talley  01/02/2022, 3:28 PM

## 2022-01-02 NOTE — Progress Notes (Signed)
NAME:  Chelsea [name], MRN:  [id number], DOB:  1955-09-04, LOS: 4 ADMISSION DATE:  01/01/2022, CONSULTATION DATE:  12/29/2021 REFERRING MD:  Dr. 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  She had admission from 12/16/21 to 12/21/21 at [facility] for Upper GI bleeding.  She was on coumadin for hx of A fib.  She had EGD on 12/29/21 at [facility] for grade 3 varices.  She had difficulty with bleeding.  She required intubation for airway protection during the procedure.  GI d/w IR about TIPS.  Pt transferred to [facility] for further management.  Hx from chart and medical team.  6/6 tx to [facility], underwent TIPS. Tx back to ICU on vent. No further bleeding.  Pertinent  Medical History  Iron deficiency anemia, DM type 2, A fib, NASH with cirrhosis, COPD, CHF, HLD, OSA, CVA, HTN  Significant Hospital Events: Including procedures, antibiotic start and stop dates in addition to other pertinent events   6/06 EGD with varix banding at [facility], intubated, start octreotide/protonix gtt, transfer to [facility] for TIPS 6/7 extubated 6/8 started on cardizem for HTN and A.fib RVR  Interim History / Subjective:  Patient converted to sinus rhythm, Cardizem drip was stopped Seen by cardiology Used BiPAP overnight, tolerated well, currently on 2 L nasal cannula oxygen   Objective   Blood pressure (!) 143/67, pulse 68, temperature (!) 97.1 F (36.2 C), temperature source Axillary, resp. rate 14, height 5\' 7"  (1.702 m), weight 112.2 kg, SpO2 99 %.    FiO2 (%):  [40 %] 40 %   Intake/Output Summary (Last 24 hours) at 01/02/2022 1010 Last data filed at 01/02/2022 [id number] Gross per 24 hour  Intake 566.95 ml  Output 300 ml  Net 266.95 ml   Filed Weights   01/01/2022 0853 12/31/21 2030 01/02/22 0500  Weight: 104.3 kg 110.2 kg 112.2 kg    Examination: Physical exam: General: Acute on chronically ill-appearing morbidly obese female,  lying on the bed HEENT: [place]/AT, eyes anicteric.  Dry mucus membranes Neuro: Alert, awake following commands Chest: Coarse breath sounds, no wheezes or rhonchi Heart: Regular rate and rhythm, no murmurs or gallops Abdomen: Soft, nontender, nondistended, bowel sounds present Skin: No rash  Assessment & Plan:  Acute upper GI bleeding from esophageal varices in setting of NASH with cirrhosis - s/p TIPS 01/19/2022. Acute hepatic encephalopathy, improved Appreciate GI follow-up Octreotide was stopped yesterday Continue Protonix twice daily She had multiple bowel movements overnight after lactulose rectal enema Switch lactulose to per tube Continue rifaximin Continue antibiotic with Unasyn  Acute hypoxic respiratory failure Probable aspiration pneumonia Probable obstructive sleep apnea Patient used BiPAP overnight  Now she is on 2 L nasal cannula oxygen Titrate with O2 sat goal 92% Continue IV Unasyn to complete 5 days antibiotic therapy  Chronic Rt pleural effusion - present on imaging studies back to May 2022. 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 and if so, would hope to see some improvement with time after TIPS - Follow CXR PRN but no role thora unless respiratory status worsens  Acute kidney injury Serum creatinine continue to rise We will try albumin with Lasix Avoid nephrotoxic agent Repeat BMP in the morning  HTN urgency, improved Paroxysmal A-fib with RVR Hypertension Chronic HFpEF  Blood pressure is better controlled now Continue antihypertensive meds Patient converted to sinus rhythm Appreciate cardiology input Continue Cardizem 90 mg every 6 hours Continue metoprolol 25  mg twice daily Patient CHADVASC score is 2, she will need anticoagulation but with recent GI bleeding, currently holding off for next few days Monitor intake and output  Iron deficiency anemia in setting of Upper GI bleeding. Continue to monitor hemoglobin and transfuse if less than 7  Poorly  controlled diabetes type 2 with hyperglycemia   DM type 2 poorly controlled with hyperglycemia. Patient hemoglobin A1c is 8.9 Her fingersticks remain between 200-300 Started on Lantus 10 units twice daily Continue sliding scale with CBG goal 140-180 Hold metformin  DNR  Best Practice (right click and "Reselect all SmartList Selections" daily)   Diet/type: Tube feeds DVT prophylaxis: SCD GI prophylaxis: PPI Lines: N/A Foley:  N/A Code Status:  DNR Last date of multidisciplinary goals of care discussion [6/10: Patient daughter was updated at bedside, she remains DNR with continue full scope of care]  Total critical care time: 33 minutes  Performed by: Jacky Kindle   Critical care time was exclusive of separately billable procedures and treating other patients.   Critical care was necessary to treat or prevent imminent or life-threatening deterioration.   Critical care was time spent personally by me on the following activities: development of treatment plan with patient and/or surrogate as well as nursing, discussions with consultants, evaluation of patient's response to treatment, examination of patient, obtaining history from patient or surrogate, ordering and performing treatments and interventions, ordering and review of laboratory studies, ordering and review of radiographic studies, pulse oximetry and re-evaluation of patient's condition.   Jacky Kindle, MD [facility] See Amion for pager If no response to pager, please call [phone] until 7pm After 7pm, Please call E-link [phone]

## 2022-01-02 NOTE — Progress Notes (Signed)
Progress Note  Patient Name: Chelsea [name] Date of Encounter: 01/02/2022  [facility] Cardiologist: Carlyle Dolly, MD   Subjective   No CP or dyspnea; on bipap  Inpatient Medications    Scheduled Meds:  sodium chloride   Intravenous Once   chlorhexidine  15 mL Mouth Rinse BID   Chlorhexidine Gluconate Cloth  6 each Topical Daily   diltiazem  90 mg Oral Q6H   feeding supplement (PROSource TF)  45 mL Per Tube Daily   hydrALAZINE  50 mg Oral Q6H   insulin aspart  0-20 Units Subcutaneous Q4H   lactulose  20 g Oral QID   mouth rinse  15 mL Mouth Rinse q12n4p   metoprolol tartrate  25 mg Oral BID   pantoprazole  40 mg Intravenous Q12H   rifaximin  550 mg Oral BID   Continuous Infusions:  feeding supplement (OSMOLITE 1.5 CAL) 40 mL/hr at 01/02/22 0609   piperacillin-tazobactam (ZOSYN)  IV 3.375 g (01/02/22 0609)   PRN Meds: albuterol, hydrALAZINE, Racepinephrine HCl   Vital Signs    Vitals:   01/02/22 0615 01/02/22 0630 01/02/22 0645 01/02/22 0700  BP:      Pulse: 70 71 68   Resp: 17 18 14    Temp:    (!) 97.1 F (36.2 C)  TempSrc:    Axillary  SpO2: 99% 99% 99%   Weight:      Height:        Intake/Output Summary (Last 24 hours) at 01/02/2022 0922 Last data filed at 01/02/2022 [id number] Gross per 24 hour  Intake 611.99 ml  Output 300 ml  Net 311.99 ml      01/02/2022    5:00 AM 12/31/2021    8:30 PM 01/06/2022    8:53 AM  Last 3 Weights  Weight (lbs) 247 lb 5.7 oz 242 lb 15.2 oz 230 lb  Weight (kg) 112.2 kg 110.2 kg 104.327 kg      Telemetry    Sinus with PACs - Personally Reviewed  Physical Exam   GEN: No acute distress.  On bipap Neck: supple Cardiac: RRR, no murmurs, rubs, or gallops.  Respiratory: Clear to auscultation bilaterally. GI: Mildly distended MS: No edema Neuro:  Nonfocal  Psych: Normal affect   Labs    High Sensitivity Troponin:   Recent Labs  Lab 12/31/21 1514  TROPONINIHS 17     Chemistry Recent Labs  Lab  12/30/21 0313 12/30/21 1947 12/31/21 0326 01/01/22 0811 01/01/22 1630 01/02/22 0402  NA 139   < > 141 138  --  139  K 3.9   < > 4.0 3.9  --  4.2  CL 110   < > 111 113*  --  111  CO2 21*   < > 19* 19*  --  20*  GLUCOSE 142*   < > 177* 189*  --  280*  BUN 6*   < > 12 18  --  28*  CREATININE 0.98   < > 1.24* 1.63*  --  1.97*  CALCIUM 8.8*   < > 9.2 9.6  --  10.1  MG 1.4*   < > 2.1  --  1.9 2.1  PROT 6.9  --  7.0 7.3  --   --   ALBUMIN 2.1*  --  2.2* 2.6*  --   --   AST 38  --  42* 37  --   --   ALT 21  --  22 22  --   --  ALKPHOS 72  --  70 69  --   --   BILITOT 3.8*  --  3.2* 3.1*  --   --   GFRNONAA >60   < > 49* 35*  --  28*  ANIONGAP 8   < > 11 6  --  8   < > = values in this interval not displayed.    Lipids  Recent Labs  Lab 12/30/21 0313  TRIG 57    Hematology Recent Labs  Lab 12/31/21 0326 01/01/22 0811 01/02/22 0402  WBC 15.6* 17.1* 15.5*  RBC 4.47 4.22 4.20  HGB 11.7* 11.0* 10.7*  HCT 35.7* 34.7* 34.3*  MCV 79.9* 82.2 81.7  MCH 26.2 26.1 25.5*  MCHC 32.8 31.7 31.2  RDW 23.9* 24.3* 25.0*  PLT 132* 128* 155    BNP Recent Labs  Lab 01/02/22 0630  BNP 619.5*     Radiology    DG Abd Portable 1V  Result Date: 01/01/2022 CLINICAL DATA:  Feeding tube placement EXAM: PORTABLE ABDOMEN - 1 VIEW COMPARISON:  None Available. FINDINGS: Tip of feeding tube is noted at the junction of body and antrum of the stomach. Bowel gas pattern in the upper abdomen is unremarkable. IMPRESSION: Tip of feeding tube is seen in the stomach. Electronically Signed   By: Elmer Picker M.D.   On: 01/01/2022 15:35     Patient Profile     66 y.o. female with past medical history of paroxysmal atrial fibrillation (on Coumadin as apparently declined DOAC therapy), hypertension, diabetes mellitus, cirrhosis, chronic kidney disease, thrombocytopenia for evaluation of atrial fibrillation and need for anticoagulation.  Echocardiogram this admission shows normal LV function, grade 2  diastolic dysfunction, mild left atrial enlargement.  Assessment & Plan    1 paroxysmal atrial fibrillation-patient has converted to sinus rhythm.  We will continue Cardizem (can change to Cardizem CD 360 mg daily once it is clear she is stable).  Continue metoprolol.  She is high risk for anticoagulation.  Can resume Coumadin once it is clear all bleeding has stopped.  I do not think there is urgency to this and could be delayed for several weeks if necessary.  Ultimately would be reasonable to be evaluated for possible Watchman device.  This could be done as an outpatient.  2 upper GI bleed with esophageal varices-status post TIPS.  Management per gastroenterology.  3 acute on chronic stage IIIa kidney disease-diurese further at this point.  Continue to follow.  For questions or updates, please contact [place] Please consult [email or web address] for contact info under        Signed, Kirk Ruths, MD  01/02/2022, 9:22 AM

## 2022-01-03 DIAGNOSIS — J9601 Acute respiratory failure with hypoxia: Secondary | ICD-10-CM | POA: Diagnosis not present

## 2022-01-03 DIAGNOSIS — N289 Disorder of kidney and ureter, unspecified: Secondary | ICD-10-CM | POA: Diagnosis not present

## 2022-01-03 DIAGNOSIS — I8500 Esophageal varices without bleeding: Secondary | ICD-10-CM | POA: Diagnosis not present

## 2022-01-03 DIAGNOSIS — G9340 Encephalopathy, unspecified: Secondary | ICD-10-CM | POA: Diagnosis not present

## 2022-01-03 DIAGNOSIS — K7682 Hepatic encephalopathy: Secondary | ICD-10-CM | POA: Diagnosis not present

## 2022-01-03 DIAGNOSIS — K922 Gastrointestinal hemorrhage, unspecified: Secondary | ICD-10-CM | POA: Diagnosis not present

## 2022-01-03 DIAGNOSIS — Z95828 Presence of other vascular implants and grafts: Secondary | ICD-10-CM | POA: Diagnosis not present

## 2022-01-03 LAB — COMPREHENSIVE METABOLIC PANEL WITH GFR
ALT: 21 U/L (ref 0–44)
AST: 30 U/L (ref 15–41)
Albumin: 2.5 g/dL — ABNORMAL LOW (ref 3.5–5.0)
Alkaline Phosphatase: 84 U/L (ref 38–126)
Anion gap: 9 (ref 5–15)
BUN: 40 mg/dL — ABNORMAL HIGH (ref 8–23)
CO2: 21 mmol/L — ABNORMAL LOW (ref 22–32)
Calcium: 10.2 mg/dL (ref 8.9–10.3)
Chloride: 109 mmol/L (ref 98–111)
Creatinine, Ser: 2.64 mg/dL — ABNORMAL HIGH (ref 0.44–1.00)
GFR, Estimated: 20 mL/min — ABNORMAL LOW
Glucose, Bld: 269 mg/dL — ABNORMAL HIGH (ref 70–99)
Potassium: 4.2 mmol/L (ref 3.5–5.1)
Sodium: 139 mmol/L (ref 135–145)
Total Bilirubin: 2.5 mg/dL — ABNORMAL HIGH (ref 0.3–1.2)
Total Protein: 7.2 g/dL (ref 6.5–8.1)

## 2022-01-03 LAB — CBC WITH DIFFERENTIAL/PLATELET
Abs Immature Granulocytes: 0.1 10*3/uL — ABNORMAL HIGH (ref 0.00–0.07)
Basophils Absolute: 0 10*3/uL (ref 0.0–0.1)
Basophils Relative: 0 %
Eosinophils Absolute: 0.1 10*3/uL (ref 0.0–0.5)
Eosinophils Relative: 1 %
HCT: 33.8 % — ABNORMAL LOW (ref 36.0–46.0)
Hemoglobin: 10.5 g/dL — ABNORMAL LOW (ref 12.0–15.0)
Immature Granulocytes: 1 %
Lymphocytes Relative: 4 %
Lymphs Abs: 0.6 10*3/uL — ABNORMAL LOW (ref 0.7–4.0)
MCH: 25.9 pg — ABNORMAL LOW (ref 26.0–34.0)
MCHC: 31.1 g/dL (ref 30.0–36.0)
MCV: 83.3 fL (ref 80.0–100.0)
Monocytes Absolute: 1.2 10*3/uL — ABNORMAL HIGH (ref 0.1–1.0)
Monocytes Relative: 9 %
Neutro Abs: 11.7 10*3/uL — ABNORMAL HIGH (ref 1.7–7.7)
Neutrophils Relative %: 85 %
Platelets: 133 10*3/uL — ABNORMAL LOW (ref 150–400)
RBC: 4.06 MIL/uL (ref 3.87–5.11)
RDW: 25.4 % — ABNORMAL HIGH (ref 11.5–15.5)
WBC: 13.7 10*3/uL — ABNORMAL HIGH (ref 4.0–10.5)
nRBC: 0 % (ref 0.0–0.2)

## 2022-01-03 LAB — BASIC METABOLIC PANEL
Anion gap: 6 (ref 5–15)
BUN: 46 mg/dL — ABNORMAL HIGH (ref 8–23)
CO2: 23 mmol/L (ref 22–32)
Calcium: 10.3 mg/dL (ref 8.9–10.3)
Chloride: 112 mmol/L — ABNORMAL HIGH (ref 98–111)
Creatinine, Ser: 2.85 mg/dL — ABNORMAL HIGH (ref 0.44–1.00)
GFR, Estimated: 18 mL/min — ABNORMAL LOW (ref >60)
Glucose, Bld: 189 mg/dL — ABNORMAL HIGH (ref 70–99)
Potassium: 4.5 mmol/L (ref 3.5–5.1)
Sodium: 141 mmol/L (ref 135–145)

## 2022-01-03 LAB — GLUCOSE, CAPILLARY
Glucose-Capillary: 252 mg/dL — ABNORMAL HIGH (ref 70–99)
Glucose-Capillary: 219 mg/dL — ABNORMAL HIGH (ref 70–99)
Glucose-Capillary: 163 mg/dL — ABNORMAL HIGH (ref 70–99)
Glucose-Capillary: 177 mg/dL — ABNORMAL HIGH (ref 70–99)
Glucose-Capillary: 187 mg/dL — ABNORMAL HIGH (ref 70–99)
Glucose-Capillary: 199 mg/dL — ABNORMAL HIGH (ref 70–99)

## 2022-01-03 LAB — PHOSPHORUS: Phosphorus: 4.3 mg/dL (ref 2.5–4.6)

## 2022-01-03 LAB — MAGNESIUM: Magnesium: 2.2 mg/dL (ref 1.7–2.4)

## 2022-01-03 MED ORDER — LACTULOSE 10 GM/15ML PO SOLN
20.0000 g | Freq: Four times a day (QID) | ORAL | Status: DC
  Administered 2022-01-03 – 2022-01-05 (×7): 20 g
  Filled 2022-01-03 (×7): qty 30

## 2022-01-03 MED ORDER — ALBUMIN HUMAN 25 % IV SOLN
50.0000 g | Freq: Once | INTRAVENOUS | Status: AC
Start: 2022-01-03 — End: 2022-01-03
  Administered 2022-01-03: 50 g via INTRAVENOUS
  Filled 2022-01-03: qty 200

## 2022-01-03 MED ORDER — HYDRALAZINE HCL 50 MG PO TABS
50.0000 mg | ORAL_TABLET | Freq: Four times a day (QID) | ORAL | Status: DC
Start: 2022-01-03 — End: 2022-01-08
  Administered 2022-01-04 – 2022-01-07 (×9): 50 mg
  Filled 2022-01-03 (×10): qty 1

## 2022-01-03 MED ORDER — INSULIN GLARGINE-YFGN 100 UNIT/ML ~~LOC~~ SOLN
20.0000 [IU] | Freq: Two times a day (BID) | SUBCUTANEOUS | Status: DC
  Administered 2022-01-03 (×2): 20 [IU] via SUBCUTANEOUS
  Filled 2022-01-03 (×4): qty 0.2

## 2022-01-03 MED ORDER — LACTATED RINGERS IV SOLN: INTRAVENOUS | Status: DC

## 2022-01-03 MED ORDER — DILTIAZEM HCL 60 MG PO TABS
90.0000 mg | ORAL_TABLET | Freq: Four times a day (QID) | ORAL | Status: DC
Start: 2022-01-04 — End: 2022-01-09
  Administered 2022-01-04 – 2022-01-09 (×15): 90 mg
  Filled 2022-01-03 (×17): qty 2

## 2022-01-03 MED ORDER — RINGERS IV SOLN: INTRAVENOUS | Status: DC

## 2022-01-03 NOTE — Progress Notes (Signed)
NAME:  Chelsea [name], MRN:  [id number], DOB:  1956/07/18, LOS: 5 ADMISSION DATE:  01/17/2022, CONSULTATION DATE:  12/29/2021 REFERRING MD:  Dr. 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  She had admission from 12/16/21 to 12/21/21 at [facility] for Upper GI bleeding.  She was on coumadin for hx of A fib.  She had EGD on 12/29/21 at [facility] for grade 3 varices.  She had difficulty with bleeding.  She required intubation for airway protection during the procedure.  GI d/w IR about TIPS.  Pt transferred to [facility] for further management.  Hx from chart and medical team.  6/6 tx to [facility]U, underwent TIPS. Tx back to ICU on vent. No further bleeding.  Pertinent  Medical History  Iron deficiency anemia, DM type 2, A fib, NASH with cirrhosis, COPD, CHF, HLD, OSA, CVA, HTN  Significant Hospital Events: Including procedures, antibiotic start and stop dates in addition to other pertinent events   6/06 EGD with varix banding at [facility], intubated, start octreotide/protonix gtt, transfer to [facility] for TIPS 6/7 extubated 6/8 started on cardizem for HTN and A.fib RVR  Interim History / Subjective:  Patient remained in sinus rhythm   Used BiPAP overnight, tolerated well, currently on 2 L nasal cannula oxygen  Serum creatinine continue to rise Objective   Blood pressure (!) 156/64, pulse 61, temperature 97.7 F (36.5 C), temperature source Axillary, resp. rate 14, height 5\' 7"  (1.702 m), weight 112.9 kg, SpO2 100 %.    FiO2 (%):  [40 %] 40 %   Intake/Output Summary (Last 24 hours) at 01/03/2022 0841 Last data filed at 01/03/2022 0600 Gross per 24 hour  Intake 1847.7 ml  Output 325 ml  Net 1522.7 ml   Filed Weights   12/31/21 2030 01/02/22 0500 01/03/22 0306  Weight: 110.2 kg 112.2 kg 112.9 kg    Examination: Physical exam: General: Acute on chronically ill-appearing morbidly obese female, lying on the  bed HEENT: [place]/AT, eyes anicteric.  Dry mucus membranes Neuro: Alert, awake following commands Chest: Coarse breath sounds, no wheezes or rhonchi Heart: Regular rate and rhythm, no murmurs or gallops Abdomen: Soft, nontender, nondistended, bowel sounds present Skin: No rash  Assessment & Plan:  Acute upper GI bleeding from esophageal varices in setting of NASH with cirrhosis - s/p TIPS 12/26/2021. Acute hepatic encephalopathy, improved Octreotide is off Continue Protonix twice daily Continue lactulose to per tube Continue rifaximin  Acute hypoxic respiratory failure Probable aspiration pneumonia Probable obstructive sleep apnea Patient used BiPAP overnight, tolerated well Now she is on 2 L nasal cannula oxygen Titrate with O2 sat goal 92% Continue IV Unasyn to complete 5 days antibiotic therapy  Chronic Rt pleural effusion - present on imaging studies back to May 2022. 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 and if so, would hope to see some improvement with time after TIPS No role thora unless respiratory status worsens  Acute kidney injury Serum creatinine continue to rise We will hold off diuretic therapy Started on gentle IV fluid hydration Repeat BMP in the afternoon Avoid nephrotoxic agent  HTN urgency, improved Paroxysmal A-fib with RVR Hypertension Chronic HFpEF  Blood pressure is better controlled now Continue antihypertensive meds Patient converted to sinus rhythm Continue Cardizem 90 mg every 6 hours Continue metoprolol 25 mg twice daily Patient CHADVASC score is 2, she will need anticoagulation but with recent GI bleeding, currently holding off for  next few days Monitor intake and output  Iron deficiency anemia in setting of Upper GI bleeding. Continue to monitor hemoglobin and transfuse if less than 7  Poorly controlled diabetes type 2 with hyperglycemia  Patient hemoglobin A1c is 8.9 Her fingersticks remain 250 - 350 Increase Lantus to 20 units twice  daily Continue sliding scale with CBG goal 140-180 Hold metformin  DNR  Best Practice (right click and "Reselect all SmartList Selections" daily)   Diet/type: Tube feeds DVT prophylaxis: SCD GI prophylaxis: PPI Lines: N/A Foley:  N/A Code Status:  DNR Last date of multidisciplinary goals of care discussion [6/10: Patient daughter was updated at bedside, she remains DNR with continue full scope of care]  Total critical care time: 33 minutes  Performed by: Jacky Kindle   Critical care time was exclusive of separately billable procedures and treating other patients.   Critical care was necessary to treat or prevent imminent or life-threatening deterioration.   Critical care was time spent personally by me on the following activities: development of treatment plan with patient and/or surrogate as well as nursing, discussions with consultants, evaluation of patient's response to treatment, examination of patient, obtaining history from patient or surrogate, ordering and performing treatments and interventions, ordering and review of laboratory studies, ordering and review of radiographic studies, pulse oximetry and re-evaluation of patient's condition.   Jacky Kindle, MD [place] See Amion for pager If no response to pager, please call [phone] until 7pm After 7pm, Please call E-link [phone]

## 2022-01-03 NOTE — Progress Notes (Signed)
Pt placed on Bipap by RT, Pt tolerating well at this time. RN aware, RT will  monitor.

## 2022-01-03 NOTE — Progress Notes (Signed)
Pt taken off BiPAP and placed on 2L [place], pt tolerating well at this time. RN at bedside, RT will monitor.

## 2022-01-03 NOTE — Progress Notes (Signed)
[facility] Progress Note  CC:  Cirrhosis s/p TIPS, anemia  Subjective:  Mental status about the same as yesterday.  Opens her eyes and says hello.  Receiving lactulose through her Cortrak.  Objective:  Vital signs in last 24 hours: Temp:  [96.8 F (36 C)-97.8 F (36.6 C)] 97.7 F (36.5 C) (06/11 0700) Pulse Rate:  [58-111] 61 (06/11 0645) Resp:  [12-22] 14 (06/11 0645) BP: (156)/(64) 156/64 (06/10 2105) SpO2:  [93 %-100 %] 100 % (06/11 0802) Arterial Line BP: (109-167)/(44-71) 128/47 (06/11 0645) FiO2 (%):  [40 %] 40 % (06/11 1032) Weight:  [112.9 kg] 112.9 kg (06/11 0306) Last BM Date : 01/03/22 General:  Sleepy but easily arousable, in NAD Heart:  Regular rate and rhythm; no murmurs Pulm:  CTAB B/L Abdomen:  Soft, obese, BS present.  No significant tenderness expressed/ Extremities:  Mild LE edema.  Intake/Output from previous day: 06/10 0701 - 06/11 0700 In: 1910.2 [P.O.:120; NG/GT:1618.5; IV Piggyback:171.7] Out: 325 [Urine:325] Intake/Output this shift: Total I/O In: 278.4 [I.V.:40.2; NG/GT:210; IV Piggyback:28.2] Out: -   Lab Results: Recent Labs    01/01/22 0811 01/02/22 0402 01/03/22 0307  WBC 17.1* 15.5* 13.7*  HGB 11.0* 10.7* 10.5*  HCT 34.7* 34.3* 33.8*  PLT 128* 155 133*   BMET Recent Labs    01/01/22 0811 01/02/22 0402 01/03/22 0307  NA 138 139 139  K 3.9 4.2 4.2  CL 113* 111 109  CO2 19* 20* 21*  GLUCOSE 189* 280* 269*  BUN 18 28* 40*  CREATININE 1.63* 1.97* 2.64*  CALCIUM 9.6 10.1 10.2   LFT Recent Labs    01/03/22 0307  PROT 7.2  ALBUMIN 2.5*  AST 30  ALT 21  ALKPHOS 84  BILITOT 2.5*   PT/INR Recent Labs    01/01/22 0811  LABPROT 20.6*  INR 1.8*   DG Abd Portable 1V  Result Date: 01/01/2022 CLINICAL DATA:  Feeding tube placement EXAM: PORTABLE ABDOMEN - 1 VIEW COMPARISON:  None Available. FINDINGS: Tip of feeding tube is noted at the junction of body and antrum of the stomach. Bowel gas pattern in  the upper abdomen is unremarkable. IMPRESSION: Tip of feeding tube is seen in the stomach. Electronically Signed   By: Ernie Avena M.D.   On: 01/01/2022 15:35    Assessment / Plan: 60) 66 year old female with NASH cirrhosis, esophageal varices with UGI bleed. EGD 6/6 showed grade 3 esophageal varices, one varix ruptured during the EGD resulting in active GI bleeding. She was intubated for airway protection.  Abdominal/pelvic CT angiogram without evidence of active bleeding. She was admitted to the ICU placed on PPI and Octreotide infusions.  She was transferred to [facility] for emergent TIPS.  S/P TIPS by IR with coil embolization of the left gastric vein 01/19/2022. Admission Hg 9.0 -> Hg 11.7 -> Hg 11.0->10.7 grams today.  No hematemesis or hematochezia. Received Octreotide and PPI infusions x 72 hrs. Now on pantoprazole 40 mg IV BID.  -Monitor Hgb.  Transfuse for hemoglobin < 7  -PPI IV BID.   2) Encephalopathy, multifactorial. Ammonia levels decreasing.  -Receiving lactulose via Cortrak.  Lactulose titrated to 2-3 good, soft BMs daily. -Xifaxan 550mg  one po bid if patient able to swallow  -Monitor neuro status closely as patient is at a higher risk for hepatic encephalopathy s/p TIPS   3) Thrombocytopenia:  133K today   4) Coagulopathy. Last INR 1.8.    5) Dysphagia:  SLP study ordered  and recommended NPO.  Cortrak was placed.   6) Atrial fibrillation on diltiazem infusion.  Coumadin on hold. -Refer to Dr. Donneta Romberg addendum 6/8 regarding anticoagulation recommendations.    7) AKI. Cr 1.24 -> 1.63->1.97->2.64.  -Urine sodium was 20 and as renal function is worsening will hold off on initiating diuretics.  If renal function still worsening tomorrow then will need to consider initiating octreotide and renal consult for possible developing HRS.  Will give an albumin challenge with 50 grams of 25% albumin.   8) COPD, possible aspiration pneumonia on Zosyn IV   9) History  of CVA   LOS: 5 days   Zanyla Klebba D. Ludell Zacarias  01/03/2022, 1:32 PM

## 2022-01-04 ENCOUNTER — Encounter (HOSPITAL_COMMUNITY): Payer: Self-pay | Admitting: Gastroenterology

## 2022-01-04 DIAGNOSIS — K922 Gastrointestinal hemorrhage, unspecified: Secondary | ICD-10-CM | POA: Diagnosis not present

## 2022-01-04 DIAGNOSIS — I8510 Secondary esophageal varices without bleeding: Secondary | ICD-10-CM | POA: Diagnosis not present

## 2022-01-04 DIAGNOSIS — K7581 Nonalcoholic steatohepatitis (NASH): Secondary | ICD-10-CM

## 2022-01-04 DIAGNOSIS — I8500 Esophageal varices without bleeding: Secondary | ICD-10-CM | POA: Diagnosis not present

## 2022-01-04 DIAGNOSIS — G9340 Encephalopathy, unspecified: Secondary | ICD-10-CM | POA: Diagnosis not present

## 2022-01-04 DIAGNOSIS — K7460 Unspecified cirrhosis of liver: Secondary | ICD-10-CM | POA: Diagnosis not present

## 2022-01-04 DIAGNOSIS — K7682 Hepatic encephalopathy: Secondary | ICD-10-CM | POA: Diagnosis not present

## 2022-01-04 DIAGNOSIS — K767 Hepatorenal syndrome: Secondary | ICD-10-CM

## 2022-01-04 DIAGNOSIS — J9601 Acute respiratory failure with hypoxia: Secondary | ICD-10-CM | POA: Diagnosis not present

## 2022-01-04 LAB — GLUCOSE, CAPILLARY
Glucose-Capillary: 228 mg/dL — ABNORMAL HIGH (ref 70–99)
Glucose-Capillary: 223 mg/dL — ABNORMAL HIGH (ref 70–99)
Glucose-Capillary: 198 mg/dL — ABNORMAL HIGH (ref 70–99)
Glucose-Capillary: 211 mg/dL — ABNORMAL HIGH (ref 70–99)
Glucose-Capillary: 179 mg/dL — ABNORMAL HIGH (ref 70–99)
Glucose-Capillary: 171 mg/dL — ABNORMAL HIGH (ref 70–99)

## 2022-01-04 LAB — BASIC METABOLIC PANEL
Anion gap: 6 (ref 5–15)
BUN: 47 mg/dL — ABNORMAL HIGH (ref 8–23)
CO2: 22 mmol/L (ref 22–32)
Calcium: 10 mg/dL (ref 8.9–10.3)
Chloride: 112 mmol/L — ABNORMAL HIGH (ref 98–111)
Creatinine, Ser: 3.09 mg/dL — ABNORMAL HIGH (ref 0.44–1.00)
GFR, Estimated: 16 mL/min — ABNORMAL LOW (ref >60)
Glucose, Bld: 258 mg/dL — ABNORMAL HIGH (ref 70–99)
Potassium: 4.7 mmol/L (ref 3.5–5.1)
Sodium: 140 mmol/L (ref 135–145)

## 2022-01-04 LAB — URINALYSIS, ROUTINE W REFLEX MICROSCOPIC
Bilirubin Urine: NEGATIVE
Glucose, UA: NEGATIVE mg/dL
Ketones, ur: NEGATIVE mg/dL
Nitrite: NEGATIVE
Protein, ur: 30 mg/dL — AB
RBC / HPF: >50 RBC/hpf — ABNORMAL HIGH (ref 0–5)
Specific Gravity, Urine: 1.026 (ref 1.005–1.030)
pH: 5 (ref 5.0–8.0)

## 2022-01-04 LAB — CBC
HCT: 35.1 % — ABNORMAL LOW (ref 36.0–46.0)
Hemoglobin: 10.9 g/dL — ABNORMAL LOW (ref 12.0–15.0)
MCH: 26.5 pg (ref 26.0–34.0)
MCHC: 31.1 g/dL (ref 30.0–36.0)
MCV: 85.2 fL (ref 80.0–100.0)
Platelets: 108 10*3/uL — ABNORMAL LOW (ref 150–400)
RBC: 4.12 MIL/uL (ref 3.87–5.11)
RDW: 25.5 % — ABNORMAL HIGH (ref 11.5–15.5)
WBC: 10.2 10*3/uL (ref 4.0–10.5)
nRBC: 0 % (ref 0.0–0.2)

## 2022-01-04 MED ORDER — MIDODRINE HCL 5 MG PO TABS
10.0000 mg | ORAL_TABLET | Freq: Three times a day (TID) | ORAL | Status: DC
  Administered 2022-01-04 – 2022-01-07 (×9): 10 mg
  Filled 2022-01-04 (×9): qty 2

## 2022-01-04 MED ORDER — ALBUMIN HUMAN 25 % IV SOLN
100.0000 g | Freq: Once | INTRAVENOUS | Status: AC
  Administered 2022-01-04: 100 g via INTRAVENOUS
  Filled 2022-01-04: qty 400

## 2022-01-04 MED ORDER — INSULIN GLARGINE-YFGN 100 UNIT/ML ~~LOC~~ SOLN
26.0000 [IU] | Freq: Two times a day (BID) | SUBCUTANEOUS | Status: DC
  Administered 2022-01-04 – 2022-01-14 (×21): 26 [IU] via SUBCUTANEOUS
  Filled 2022-01-04 (×22): qty 0.26

## 2022-01-04 MED ORDER — RIFAXIMIN 550 MG PO TABS
550.0000 mg | ORAL_TABLET | Freq: Two times a day (BID) | ORAL | Status: DC
  Administered 2022-01-04 – 2022-01-14 (×20): 550 mg
  Filled 2022-01-04 (×21): qty 1

## 2022-01-04 MED ORDER — ALBUMIN HUMAN 25 % IV SOLN
100.0000 g | Freq: Once | INTRAVENOUS | Status: DC
  Filled 2022-01-04: qty 400

## 2022-01-04 MED ORDER — SODIUM CHLORIDE 0.9 % IV SOLN
50.0000 ug/h | INTRAVENOUS | Status: DC
  Administered 2022-01-04 – 2022-01-06 (×5): 50 ug/h via INTRAVENOUS
  Filled 2022-01-04 (×6): qty 1

## 2022-01-04 MED ORDER — METOPROLOL TARTRATE 25 MG PO TABS
25.0000 mg | ORAL_TABLET | Freq: Two times a day (BID) | ORAL | Status: DC
  Administered 2022-01-04 – 2022-01-08 (×7): 25 mg
  Filled 2022-01-04 (×8): qty 1

## 2022-01-04 NOTE — Progress Notes (Signed)
Progress Note  Patient Name: Chelsea [name] Date of Encounter: 01/04/2022  [facility] Cardiologist: Carlyle Dolly, MD   Subjective   Did TIPS in the past.  No further bleeding. Is looking forward to getting a Sprite when she is able. No CP, SOB, palpitations  Inpatient Medications    Scheduled Meds:  chlorhexidine  15 mL Mouth Rinse BID   Chlorhexidine Gluconate Cloth  6 each Topical Daily   diltiazem  90 mg Per Tube Q6H   feeding supplement (PROSource TF)  45 mL Per Tube Daily   hydrALAZINE  50 mg Per Tube Q6H   insulin aspart  0-20 Units Subcutaneous Q4H   insulin glargine-yfgn  26 Units Subcutaneous BID   lactulose  20 g Per Tube QID   mouth rinse  15 mL Mouth Rinse q12n4p   metoprolol tartrate  25 mg Per Tube BID   midodrine  10 mg Per Tube TID WC   pantoprazole  40 mg Intravenous Q12H   rifaximin  550 mg Per Tube BID   Continuous Infusions:  feeding supplement (OSMOLITE 1.5 CAL) 50 mL/hr at 01/04/22 0600   octreotide (SANDOSTATIN) 500 mcg in sodium chloride 0.9 % 250 mL (2 mcg/mL) infusion 50 mcg/hr (01/04/22 0932)   piperacillin-tazobactam (ZOSYN)  IV 12.5 mL/hr at 01/04/22 0600   PRN Meds: albuterol, hydrALAZINE, lip balm, Racepinephrine HCl   Vital Signs    Vitals:   01/04/22 0700 01/04/22 0732 01/04/22 0800 01/04/22 0919  BP: 126/69  126/62 (!) 143/78  Pulse: 66  68 68  Resp: 17  15   Temp:  (!) 97.4 F (36.3 C)    TempSrc:  Axillary    SpO2: 100%  100%   Weight:      Height:        Intake/Output Summary (Last 24 hours) at 01/04/2022 1025 Last data filed at 01/04/2022 0853 Gross per 24 hour  Intake 3066.48 ml  Output 250 ml  Net 2816.48 ml      01/04/2022    2:42 AM 01/03/2022    3:06 AM 01/02/2022    5:00 AM  Last 3 Weights  Weight (lbs) 251 lb 15.8 oz 248 lb 14.4 oz 247 lb 5.7 oz  Weight (kg) 114.3 kg 112.9 kg 112.2 kg      Telemetry    Sinus rhythm - Personally Reviewed  Physical Exam   Gen: No distress, morbid obesity    Neck: No JVD,  Cardiac: No Rubs or Gallops, Systolic Murmur, RRR+2 radial pulses Respiratory: Clear to auscultation bilaterally, normal effort, normal  respiratory rate GI: Soft, nontender, non-distended  MS: +2 bilateral pitting edema;  moves all extremities Integument: Skin feels warm Neuro:  At time of evaluation, alert and oriented to person and place Psych: Normal affect   Labs    High Sensitivity Troponin:   Recent Labs  Lab 12/31/21 1514  TROPONINIHS 17     Chemistry Recent Labs  Lab 12/31/21 0326 01/01/22 0811 01/01/22 1630 01/02/22 0402 01/03/22 0307 01/03/22 1542 01/04/22 0314  NA 141 138  --  139 139 141 140  K 4.0 3.9  --  4.2 4.2 4.5 4.7  CL 111 113*  --  111 109 112* 112*  CO2 19* 19*  --  20* 21* 23 22  GLUCOSE 177* 189*  --  280* 269* 189* 258*  BUN 12 18  --  28* 40* 46* 47*  CREATININE 1.24* 1.63*  --  1.97* 2.64* 2.85* 3.09*  CALCIUM 9.2 9.6  --  10.1 10.2 10.3 10.0  MG 2.1  --  1.9 2.1 2.2  --   --   PROT 7.0 7.3  --   --  7.2  --   --   ALBUMIN 2.2* 2.6*  --   --  2.5*  --   --   AST 42* 37  --   --  30  --   --   ALT 22 22  --   --  21  --   --   ALKPHOS 70 69  --   --  84  --   --   BILITOT 3.2* 3.1*  --   --  2.5*  --   --   GFRNONAA 49* 35*  --  28* 20* 18* 16*  ANIONGAP 11 6  --  8 9 6 6     Lipids  Recent Labs  Lab 12/30/21 0313  TRIG 57    Hematology Recent Labs  Lab 01/02/22 0402 01/03/22 0307 01/04/22 0314  WBC 15.5* 13.7* 10.2  RBC 4.20 4.06 4.12  HGB 10.7* 10.5* 10.9*  HCT 34.3* 33.8* 35.1*  MCV 81.7 83.3 85.2  MCH 25.5* 25.9* 26.5  MCHC 31.2 31.1 31.1  RDW 25.0* 25.4* 25.5*  PLT 155 133* 108*    BNP Recent Labs  Lab 01/02/22 0630  BNP 619.5*     Radiology    No results found.   Patient Profile     66 y.o. female with past medical history of paroxysmal atrial fibrillation (on Coumadin as apparently declined DOAC therapy), hypertension, diabetes mellitus, cirrhosis, chronic kidney disease,  thrombocytopenia for evaluation of atrial fibrillation and need for anticoagulation.  Echocardiogram this admission shows normal LV function, grade 2 diastolic dysfunction, mild left atrial enlargement.  Assessment & Plan    Paroxysmal atrial fibrillation Upper GI bleed with esophageal varicies; IDA AKI on CKD with worsening AKI (potential CIN component) NASH cirrhosis HTN with DM Morbid obesity, hypoalbuminemia with LE swelling New, worsening thrombocytopenia - CHADSVASC 2; restart AC when able (was on coumadin; this can be started as outpatient ) - when patient is able to transition to PO, would transition to diltiazem 360 mg PO daily  [facility] will sign off.   Medication Recommendations:  Coumadin when cleared, diltiazem with XL transition when on PO Other recommendations (labs, testing, etc):  NA Follow up as an outpatient:  We will arrange outpatient f/u    For questions or updates, please contact [place] Please consult [email or web address] for contact info under        Signed, Werner Lean, MD  01/04/2022, 10:25 AM

## 2022-01-04 NOTE — Progress Notes (Signed)
NAME:  JOCELINE RINGLEY, MRN:  [id number], DOB:  27-Apr-1956, LOS: 6 ADMISSION DATE:  12/28/2021, CONSULTATION DATE:  12/29/2021 REFERRING MD:  Dr. 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  She had admission from 12/16/21 to 12/21/21 at [facility] for Upper GI bleeding.  She was on coumadin for hx of A fib.  She had EGD on 12/29/21 at [facility] for grade 3 varices.  She had difficulty with bleeding.  She required intubation for airway protection during the procedure.  GI d/w IR about TIPS.  Pt transferred to [facility] for further management.  Hx from chart and medical team.  6/6 tx to [facility], underwent TIPS. Tx back to ICU on vent. No further bleeding.  Pertinent  Medical History  Iron deficiency anemia, DM type 2, A fib, NASH with cirrhosis, COPD, CHF, HLD, OSA, CVA, HTN  Significant Hospital Events: Including procedures, antibiotic start and stop dates in addition to other pertinent events   6/06 EGD with varix banding at [facility], intubated, start octreotide/protonix gtt, transfer to [facility] for TIPS 6/7 extubated 6/8 started on cardizem for HTN and A.fib RVR  Interim History / Subjective:  Patient serum creatinine continue to rise She became anuric since yesterday Remained afebrile  Objective   Blood pressure 126/62, pulse 68, temperature (!) 97.4 F (36.3 C), temperature source Axillary, resp. rate 15, height 5\' 7"  (1.702 m), weight 114.3 kg, SpO2 100 %.    FiO2 (%):  [40 %] 40 %   Intake/Output Summary (Last 24 hours) at 01/04/2022 0915 Last data filed at 01/04/2022 0853 Gross per 24 hour  Intake 3159.88 ml  Output 250 ml  Net 2909.88 ml   Filed Weights   01/02/22 0500 01/03/22 0306 01/04/22 0242  Weight: 112.2 kg 112.9 kg 114.3 kg    Examination: Physical exam: General: Acute on chronically ill-appearing morbidly obese female, lying on the bed HEENT: [place]/AT, eyes anicteric.  Dry mucus  membranes Neuro: Awake, following commands, slow to respond in general Chest: Coarse breath sounds, no wheezes or rhonchi Heart: Regular rate and rhythm, no murmurs or gallops Abdomen: Soft, nontender, nondistended, bowel sounds present Skin: No rash  Assessment & Plan:  Acute upper GI bleeding from esophageal varices in setting of NASH with cirrhosis - s/p TIPS 12/28/2021. Acute hepatic encephalopathy, improved Continue Protonix twice daily Continue lactulose to per tube with a goal BM 3 times a day Continue rifaximin  Acute hypoxic respiratory failure Probable aspiration pneumonia Probable obstructive sleep apnea Patient used BiPAP overnight, tolerated well Now she is back on on 2 L nasal cannula oxygen Titrate with O2 sat goal 92% Continue IV Unasyn to complete 7 days antibiotic therapy  Chronic Rt pleural effusion - present on imaging studies back to May 2022. 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 and if so, would hope to see some improvement with time after TIPS No role thora unless respiratory status worsens  Acute kidney injury due to hepatorenal syndrome versus contrast-induced nephropathy Serum creatinine continue to rise Diuretics have been on hold her serum creatinine continue to trend up Studies she received IV fluid and albumin without much improvement Avoid nephrotoxic agent Started on octreotide and midodrine Continue albumin for possible HRS GI is on board Nephrology consult is requested  HTN urgency, improved Paroxysmal A-fib with RVR Hypertension Chronic HFpEF  Blood pressure is better controlled now Continue antihypertensive meds Patient remained in sinus rhythm Continue  Cardizem 90 mg every 6 hours Continue metoprolol 25 mg twice daily Patient CHADVASC score is 2, she will need anticoagulation but with recent GI bleeding, currently holding off for next few days Monitor intake and output  Iron deficiency anemia in setting of Upper GI  bleeding. Continue to monitor hemoglobin and transfuse if less than 7  Poorly controlled diabetes type 2 with hyperglycemia  Patient hemoglobin A1c is 8.9 Her fingersticks remain 200 - 300 Increase Lantus to 26 units twice daily Continue sliding scale with CBG goal 140-180 Hold metformin  DNR  Best Practice (right click and "Reselect all SmartList Selections" daily)   Diet/type: Tube feeds DVT prophylaxis: SCD GI prophylaxis: PPI Lines: N/A Foley:  N/A Code Status:  DNR Last date of multidisciplinary goals of care discussion [6/12: Patient daughter was updated at bedside, she remains DNR with continue full scope of care]  Total critical care time: 34 minutes  Performed by: Jacky Kindle   Critical care time was exclusive of separately billable procedures and treating other patients.   Critical care was necessary to treat or prevent imminent or life-threatening deterioration.   Critical care was time spent personally by me on the following activities: development of treatment plan with patient and/or surrogate as well as nursing, discussions with consultants, evaluation of patient's response to treatment, examination of patient, obtaining history from patient or surrogate, ordering and performing treatments and interventions, ordering and review of laboratory studies, ordering and review of radiographic studies, pulse oximetry and re-evaluation of patient's condition.   Jacky Kindle, MD [place] [facility] See Amion for pager If no response to pager, please call [phone] until 7pm After 7pm, Please call E-link [phone]

## 2022-01-04 NOTE — Progress Notes (Signed)
Pt placed on bipap to rest by RT and RN. RT will cont to monitor as needed.

## 2022-01-04 NOTE — Progress Notes (Signed)
Speech Language Pathology Treatment: Dysphagia  Patient Details Name: Chelsea [name] MRN: [id number] DOB: Apr 25, 1956 Today's Date: 01/04/2022 Time: [id number] SLP Time Calculation (min) (ACUTE ONLY): 21 min  Assessment / Plan / Recommendation Clinical Impression  Pt with improved alertness this am, although RN reports continued fluctuation. Oral care provided prior to trials of ice chips x3, sips of thin liquids by cup/straw and x5 bites of puree. With initial trials of ice chips and cup sips of water, pt with overt coughing x2 and frequent throat clearing. Small sips by straw and bites of puree were without obvious signs of aspiration. She was able to orally clear solid. She was noted with intermittent oral holding and suspected delay in swallow initiation impacted likely by reduced sustained attention to POs. Given clinical presentation this date and improved, but continued fluctuating mentation, recommend continue NPO with allowance of ice chips after oral care. Will f/u to determine if diet can be advanced at bedside or if an instrumental assessment is warranted as mentation shows more consistency.    HPI HPI: Patient is a 66 y.o. 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 She had recent admisison 5/24 to 12/21/21 at [facility] for upper GI bleeding and had EGD on 6/6 at [facility] for grade 3 varices but had difficulty with bleeding and required intubation for airway protection during the procedure and transferred to [facility] for IR to assess TIPS.      SLP Plan  Continue with current plan of care      Recommendations for follow up therapy are one component of a multi-disciplinary discharge planning process, led by the attending physician.  Recommendations may be updated based on patient status, additional functional criteria and insurance authorization.    Recommendations  Diet recommendations: NPO;Other(comment) (Ice chips after oral care) Medication Administration: Via  alternative means Supervision: Full supervision/cueing for compensatory strategies;Staff to assist with self feeding;Trained caregiver to feed patient Compensations: Minimize environmental distractions;Slow rate;Small sips/bites Postural Changes and/or Swallow Maneuvers: Seated upright 90 degrees                Oral Care Recommendations: Oral care prior to ice chip/H20 Follow Up Recommendations: Skilled nursing-short term rehab (<3 hours/day) Assistance recommended at discharge: Set up Supervision/Assistance SLP Visit Diagnosis: Dysphagia, unspecified (R13.10) Plan: Continue with current plan of care           Ellwood Dense, [place], [place] Office Number: [place]  01/04/2022, 9:16 AM

## 2022-01-04 NOTE — Progress Notes (Signed)
Inpatient Diabetes Program Recommendations  AACE/ADA: New Consensus Statement on Inpatient Glycemic Control (2015)  Target Ranges:  Prepandial:   less than 140 mg/dL      Peak postprandial:   less than 180 mg/dL (1-2 hours)      Critically ill patients:  140 - 180 mg/dL   Lab Results  Component Value Date   GLUCAP 223 (H) 01/04/2022   HGBA1C 8.9 (H) 12/16/2021    Review of Glycemic Control  Diabetes history: DM 2 Outpatient Diabetes medications: Metformin 1000 mg bid Current orders for Inpatient glycemic control:  Novolog 0-20 uits Q4 hours Semglee 26 units bid  Osmolite 50 ml/hour Note: Semglee increased from 20 to 26 units bid today.  Inpatient Diabetes Program Recommendations:    -   May consider lowering basal insulin dose back down and starting Novolog Tube Feed overage 4 units Q4 hours. Novolog can be titrated quicker than Semglee if Tube Feeds are adjusted.  Thanks,  Christena Deem RN, MSN, BC-ADM Inpatient Diabetes Coordinator Team Pager [phone] (8a-5p)

## 2022-01-04 NOTE — Progress Notes (Signed)
Pharmacy Antibiotic Note  Chelsea [name] is a 66 y.o. female admitted on 12/26/2021 with aspiration pneumonia.  Pharmacy has been consulted for Zosyn dosing. CrCL  45 ml/min  Renal function worsening - current CrCL 24 ml/min - if continues to worsen will need to decrease Zosyn dosing.  Plan for a total of 7 days - stop date entered.  Plan: Continue Zosyn 3.375g IV q8h (4 hour infusion). Monitor renal function and C&S  Height: 5\' 7"  (170.2 cm) Weight: 114.3 kg (251 lb 15.8 oz) IBW/kg (Calculated) : 61.6  Temp (24hrs), Avg:97.5 F (36.4 C), Min:97.1 F (36.2 C), Max:98 F (36.7 C)  Recent Labs  Lab 01/09/2022 1506 12/30/2021 2312 12/30/21 0150 12/30/21 0313 12/30/21 [id number] 12/30/21 1127 12/31/21 0326 01/01/22 0811 01/02/22 0402 01/03/22 0307 01/03/22 1542 01/04/22 0314  WBC 3.5*   < >  --   --  8.6   < > 15.6* 17.1* 15.5* 13.7*  --  10.2  CREATININE  --   --   --    < >  --    < > 1.24* 1.63* 1.97* 2.64* 2.85* 3.09*  LATICACIDVEN 2.1*  --  2.8*  --  2.7*  --  3.2*  --   --   --   --   --    < > = values in this interval not displayed.     Estimated Creatinine Clearance: 24 mL/min (A) (by C-G formula based on SCr of 3.09 mg/dL (H)).    Allergies  Allergen Reactions   Bee Venom Anaphylaxis   Lisinopril Cough    Antimicrobials this admission: CTX 6/6 >> 6/9 Zosyn 6/9 >>   Thank you for allowing pharmacy to be a part of this patient's care.  Alanda Slim, PharmD, [facility] Clinical Pharmacist Please see AMION for all Pharmacists' Contact Phone Numbers 01/04/2022, 8:18 AM

## 2022-01-04 NOTE — Progress Notes (Signed)
Daily Rounding Note  01/04/2022, 1:10 PM  LOS: 6 days   SUBJECTIVE:   Chief complaint:      Stools brown and watery in setting of Lactulose.  Off vent for a couple of days but prn bipap in use.  Denises abd pain.   Remains NPO on tube feeds.    OBJECTIVE:         Vital signs in last 24 hours:    Temp:  [97.1 F (36.2 C)-97.6 F (36.4 C)] 97.6 F (36.4 C) (06/12 1100) Pulse Rate:  [56-169] 68 (06/12 1300) Resp:  [10-29] 16 (06/12 1300) BP: (112-218)/(57-110) 114/65 (06/12 1300) SpO2:  [97 %-100 %] 99 % (06/12 1300) Arterial Line BP: (77-155)/(49-73) 77/63 (06/11 1930) FiO2 (%):  [40 %] 40 % (06/12 0430) Weight:  [114.3 kg] 114.3 kg (06/12 0242) Last BM Date : 01/04/22 Filed Weights   01/02/22 0500 01/03/22 0306 01/04/22 0242  Weight: 112.2 kg 112.9 kg 114.3 kg   General: obese, alert, comfortable, bipap mask in place   Heart: RRR Chest: no labored breathing Abdomen: soft, obese, NT, active BS.   Watery brown stool in flexiseal Extremities: slight pitting of marked pedal edema.  Feet are warm.   Neuro/Psych:  following command, 5/5 LUE grip strength, 4/5 RUE grip strength.  No asterixis.    Intake/Output from previous day: 06/11 0701 - 06/12 0700 In: 3344.9 [I.V.:2003.3; NG/GT:1210; IV Piggyback:131.5] Out: 100 [Stool:100]  Intake/Output this shift: Total I/O In: -  Out: 150 [Urine:150]  Lab Results: Recent Labs    01/02/22 0402 01/03/22 0307 01/04/22 0314  WBC 15.5* 13.7* 10.2  HGB 10.7* 10.5* 10.9*  HCT 34.3* 33.8* 35.1*  PLT 155 133* 108*   BMET Recent Labs    01/03/22 0307 01/03/22 1542 01/04/22 0314  NA 139 141 140  K 4.2 4.5 4.7  CL 109 112* 112*  CO2 21* 23 22  GLUCOSE 269* 189* 258*  BUN 40* 46* 47*  CREATININE 2.64* 2.85* 3.09*  CALCIUM 10.2 10.3 10.0   LFT Recent Labs    01/03/22 0307  PROT 7.2  ALBUMIN 2.5*  AST 30  ALT 21  ALKPHOS 84  BILITOT 2.5*   PT/INR No  results for input(s): "LABPROT", "INR" in the last 72 hours. Hepatitis Panel No results for input(s): "HEPBSAG", "HCVAB", "HEPAIGM", "HEPBIGM" in the last 72 hours.  Studies/Results: No results found.  Scheduled Meds:  chlorhexidine  15 mL Mouth Rinse BID   Chlorhexidine Gluconate Cloth  6 each Topical Daily   diltiazem  90 mg Per Tube Q6H   feeding supplement (PROSource TF)  45 mL Per Tube Daily   hydrALAZINE  50 mg Per Tube Q6H   insulin aspart  0-20 Units Subcutaneous Q4H   insulin glargine-yfgn  26 Units Subcutaneous BID   lactulose  20 g Per Tube QID   mouth rinse  15 mL Mouth Rinse q12n4p   metoprolol tartrate  25 mg Per Tube BID   midodrine  10 mg Per Tube TID WC   pantoprazole  40 mg Intravenous Q12H   rifaximin  550 mg Per Tube BID   Continuous Infusions:  feeding supplement (OSMOLITE 1.5 CAL) 50 mL/hr at 01/04/22 0600   octreotide (SANDOSTATIN) 500 mcg in sodium chloride 0.9 % 250 mL (2 mcg/mL) infusion 50 mcg/hr (01/04/22 0932)   piperacillin-tazobactam (ZOSYN)  IV 12.5 mL/hr at 01/04/22 0600   PRN Meds:.albuterol, hydrALAZINE, lip balm, Racepinephrine HCl   ASSESMENT:    *  Bleeding Esophageal varices,  6/6 EGD with grade 3 esophageal varices.  1 banded, 1 varix ruptured resulting in active bleeding and need for intubation for airway protection.  Transfer to [facility] on PPI, octreotide.  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 Octreotide discontinued 6/9.  Protonix 40 IV bid remains in place.    Blood loss anemia.  Received 2 PRBCs on 6/6.  Hgb stable 10.9.  Thrombocytopenia.  Platelets 108K.  Up from nadir 51 but down from subsequent level of 155.    NASH cirrhosis.  Dysphagia.  Core track placed.  SLP recommended NPO.  Atrial fibrillation.  Coumadin on hold.  Possible aspiration pneumonia, background COPD.  Zosyn in place.  AMS.   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  Ammonia 64.    AKI.  Urine sodium 20, HRS?,  Contrast nephropathy?   Received doses of IV albumin x 2, 1 x yest, 1 x today.  On midocrine po as of this AM.  IV octreotide in place.  Urine output 325 mL 2 d ago, nothing recorded yesterday, 150 mL today.  Nephro consult today, see note.  No current need for RRT, but could be headed in that direction.    Coagulopathy.  INR 1.8.    Bacteriuria,leukosuria, micro hematuria.  No urine clx thus far.    Moderate ascites per CT scan 6/6.  Increase of moderate right and new left small pleural effusions with collapse of LLL and partial collapse RLL per 6/6 CT.  Has not had paracentesis   PLAN   Although from bleeding standpoint patient does not need IV octreotide after 72 hours of index variceal bleed, givenAKI, continue this.    ? Role for more Albumin.       Azucena Freed  01/04/2022, 1:10 PM Phone [phone]
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
## 2022-01-05 ENCOUNTER — Inpatient Hospital Stay (HOSPITAL_COMMUNITY): Payer: No Typology Code available for payment source

## 2022-01-05 DIAGNOSIS — G9340 Encephalopathy, unspecified: Secondary | ICD-10-CM | POA: Diagnosis not present

## 2022-01-05 DIAGNOSIS — I8500 Esophageal varices without bleeding: Secondary | ICD-10-CM | POA: Diagnosis not present

## 2022-01-05 DIAGNOSIS — I8510 Secondary esophageal varices without bleeding: Secondary | ICD-10-CM | POA: Diagnosis not present

## 2022-01-05 DIAGNOSIS — K922 Gastrointestinal hemorrhage, unspecified: Secondary | ICD-10-CM | POA: Diagnosis not present

## 2022-01-05 DIAGNOSIS — K7460 Unspecified cirrhosis of liver: Secondary | ICD-10-CM | POA: Diagnosis not present

## 2022-01-05 DIAGNOSIS — K7581 Nonalcoholic steatohepatitis (NASH): Secondary | ICD-10-CM | POA: Diagnosis not present

## 2022-01-05 DIAGNOSIS — J9601 Acute respiratory failure with hypoxia: Secondary | ICD-10-CM | POA: Diagnosis not present

## 2022-01-05 DIAGNOSIS — K7682 Hepatic encephalopathy: Secondary | ICD-10-CM | POA: Diagnosis not present

## 2022-01-05 LAB — CBC
HCT: 33.3 % — ABNORMAL LOW (ref 36.0–46.0)
Hemoglobin: 10.1 g/dL — ABNORMAL LOW (ref 12.0–15.0)
MCH: 26.1 pg (ref 26.0–34.0)
MCHC: 30.3 g/dL (ref 30.0–36.0)
MCV: 86 fL (ref 80.0–100.0)
Platelets: 106 10*3/uL — ABNORMAL LOW (ref 150–400)
RBC: 3.87 MIL/uL (ref 3.87–5.11)
RDW: 26 % — ABNORMAL HIGH (ref 11.5–15.5)
WBC: 9.7 10*3/uL (ref 4.0–10.5)
nRBC: 0 % (ref 0.0–0.2)

## 2022-01-05 LAB — GLUCOSE, CAPILLARY
Glucose-Capillary: 150 mg/dL — ABNORMAL HIGH (ref 70–99)
Glucose-Capillary: 159 mg/dL — ABNORMAL HIGH (ref 70–99)
Glucose-Capillary: 151 mg/dL — ABNORMAL HIGH (ref 70–99)
Glucose-Capillary: 161 mg/dL — ABNORMAL HIGH (ref 70–99)
Glucose-Capillary: 206 mg/dL — ABNORMAL HIGH (ref 70–99)
Glucose-Capillary: 207 mg/dL — ABNORMAL HIGH (ref 70–99)

## 2022-01-05 LAB — BASIC METABOLIC PANEL
Anion gap: 10 (ref 5–15)
BUN: 59 mg/dL — ABNORMAL HIGH (ref 8–23)
CO2: 20 mmol/L — ABNORMAL LOW (ref 22–32)
Calcium: 10.5 mg/dL — ABNORMAL HIGH (ref 8.9–10.3)
Chloride: 109 mmol/L (ref 98–111)
Creatinine, Ser: 3.53 mg/dL — ABNORMAL HIGH (ref 0.44–1.00)
GFR, Estimated: 14 mL/min — ABNORMAL LOW (ref >60)
Glucose, Bld: 158 mg/dL — ABNORMAL HIGH (ref 70–99)
Potassium: 4.6 mmol/L (ref 3.5–5.1)
Sodium: 139 mmol/L (ref 135–145)

## 2022-01-05 LAB — POCT I-STAT 7, (LYTES, BLD GAS, ICA,H+H)
Acid-base deficit: 2 mmol/L (ref 0.0–2.0)
Bicarbonate: 23.5 mmol/L (ref 20.0–28.0)
Calcium, Ion: 1.48 mmol/L — ABNORMAL HIGH (ref 1.15–1.40)
HCT: 33 % — ABNORMAL LOW (ref 36.0–46.0)
Hemoglobin: 11.2 g/dL — ABNORMAL LOW (ref 12.0–15.0)
O2 Saturation: 97 %
Patient temperature: 97.7
Potassium: 4.7 mmol/L (ref 3.5–5.1)
Sodium: 144 mmol/L (ref 135–145)
TCO2: 25 mmol/L (ref 22–32)
pCO2 arterial: 43 mmHg (ref 32–48)
pH, Arterial: 7.344 — ABNORMAL LOW (ref 7.35–7.45)
pO2, Arterial: 91 mmHg (ref 83–108)

## 2022-01-05 LAB — C3 COMPLEMENT: C3 Complement: 68 mg/dL — ABNORMAL LOW (ref 82–167)

## 2022-01-05 LAB — C4 COMPLEMENT: Complement C4, Body Fluid: 6 mg/dL — ABNORMAL LOW (ref 12–38)

## 2022-01-05 MED ORDER — FREE WATER
50.0000 mL | Freq: Four times a day (QID) | Status: DC
Start: 2022-01-05 — End: 2022-01-06
  Administered 2022-01-05 – 2022-01-06 (×5): 50 mL

## 2022-01-05 MED ORDER — LACTULOSE 10 GM/15ML PO SOLN
20.0000 g | Freq: Three times a day (TID) | ORAL | Status: DC
  Administered 2022-01-05 – 2022-01-06 (×2): 20 g
  Filled 2022-01-05 (×2): qty 30

## 2022-01-05 NOTE — Progress Notes (Signed)
Speech Language Pathology Treatment: Dysphagia  Patient Details Name: Chelsea [name] MRN: [id number] DOB: 1955-08-19 Today's Date: 01/05/2022 Time: [id number] SLP Time Calculation (min) (ACUTE ONLY): 15 min  Assessment / Plan / Recommendation Clinical Impression  Signs of potential penetration/aspiration present in pt with acute deconditioning. There were immediate and delayed throat clears after cup sips thin and increased work of breathing. Applesauce with minimal dyspnea. Given level of poor endurance, respiratory concern, several day intubation and clinical observations recommend MBS today at 1330.    HPI HPI: Patient is a 66 y.o. 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 She had recent admisison 5/24 to 12/21/21 at [facility] for upper GI bleeding and had EGD on 6/6 at [facility] for grade 3 varices but had difficulty with bleeding and required intubation for airway protection during the procedure and transferred to [facility] for IR to assess TIPS.      SLP Plan  MBS      Recommendations for follow up therapy are one component of a multi-disciplinary discharge planning process, led by the attending physician.  Recommendations may be updated based on patient status, additional functional criteria and insurance authorization.    Recommendations  Diet recommendations: Other(comment) (ice chips) Medication Administration: Via alternative means                Oral Care Recommendations: Oral care prior to ice chip/H20 Follow Up Recommendations: Skilled nursing-short term rehab (<3 hours/day) Assistance recommended at discharge: Intermittent Supervision/Assistance SLP Visit Diagnosis: Dysphagia, unspecified (R13.10) Plan: MBS           Houston Siren  01/05/2022, 12:19 PM

## 2022-01-05 NOTE — Progress Notes (Signed)
[facility] Progress Note   66 y.o. 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  Cr baseline is 0.7-1 as recently as 27-Jan-2022 with worsening renal function since 6/7 which has steadily increased each day to 3.09 at time of consultation. Patient received Omnipaque x2-3 on 6/6. Patient also received Lasix from 6/6-6/10. Patient was also in afib RVR  intermittently from 6/7-6/9 treated with initially a CCB gtt and then with a Bblr.  She also had a foley catheter also from 6/6-6/11. Sister usually bedside.  Assessment/ Plan:   Acute Kidney Injury - likely secondary to contrast induced nephropathy as renal function started worsening after the needed IV contrast administration. But part of the differential is MPGN and HRS type 1. She is already being treated with Midodrine and Octreotide.  - Fortunately no absolute indication for RRT but headed in that direction which I communicated to the patient and the sister who was bedside. She is already +6.3 L during this hospitalization and requires O2 support + poor UOP (oliguric) -> in and out cath today . Hopefully she plateaus and UOP may be starting to pick up.   - Will evaluate urine microscopically today. The blood and WBC + bacteria could have been secondary to the foley that was in place for 5 days. C3C4 may be low from cirrhosis; would like to avoid a biopsy given recent GIB + this is more likely to be CIN but certainly HRS still in the differential. Renal function was normal prior to and then steadily worsened post contrast. Contrast was required for the TIPS procedure.    -Maintain MAP>65 for optimal renal perfusion.  - Avoid nephrotoxic medications including NSAIDs and iodinated intravenous contrast exposure unless the latter is absolutely indicated.   - Preferred narcotic agents for pain control are hydromorphone, fentanyl, and methadone.  Morphine should not be used.  - Avoid Baclofen and avoid oral sodium phosphate and magnesium citrate based laxatives / bowel preps.  - Continue strict Input and Output monitoring. Will monitor the patient closely with you and intervene or adjust therapy as indicated by changes in clinical status/labs   Iron deficiency anemia with h/o UGI bleed.  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  Subjective:   Mild dyspnea but denies nausea/ vomiting/ fevers / chills. 1L stool output /24hrs.   Objective:   BP 134/68   Pulse 67   Temp (!) 97.5 F (36.4 C) (Oral)   Resp 14   Ht 5\' 7"  (1.702 m)   Wt 118.7 kg   SpO2 98%   BMI 40.99 kg/m   Intake/Output Summary (Last 24 hours) at 01/05/2022 0938 Last data filed at 01/05/2022 0700 Gross per 24 hour  Intake 2231.44 ml  Output 1450 ml  Net 781.44 ml   Weight change: 4.4 kg  Physical Exam: GEN: NAD,  NCAT HEENT: No conjunctival pallor, EOMI NECK: Supple, no thyromegaly LUNGS: CTA B/L no rales, rhonchi or wheezing CV: RRR, No M/R/G ABD: SNDNT +BS  EXT: 1+ lower extremity edema   Imaging: No results found.  Labs: BMET Recent Labs  Lab 12/30/21 0313 12/30/21 1947 12/31/21 0326 01/01/22 03/03/22 01/01/22 1630 01/02/22 0402 01/03/22 0307 01/03/22 1542 01/04/22 0314 01/05/22 0630  NA 139   < > 141 138  --  139 139 141 140  139  K 3.9   < > 4.0 3.9  --  4.2 4.2 4.5 4.7 4.6  CL 110   < > 111 113*  --  111 109 112* 112* 109  CO2 21*   < > 19* 19*  --  20* 21* 23 22 20*  GLUCOSE 142*   < > 177* 189*  --  280* 269* 189* 258* 158*  BUN 6*   < > 12 18  --  28* 40* 46* 47* 59*  CREATININE 0.98   < > 1.24* 1.63*  --  1.97* 2.64* 2.85* 3.09* 3.53*  CALCIUM 8.8*   < > 9.2 9.6  --  10.1 10.2 10.3 10.0 10.5*  PHOS 3.9  --  4.0  --  3.2 4.5 4.3  --   --   --    < > = values in this interval not displayed.   CBC Recent Labs  Lab  01/13/2022 1506 01/03/2022 2033 01/02/22 0402 01/03/22 0307 01/04/22 0314 01/05/22 0630  WBC 3.5*   < > 15.5* 13.7* 10.2 9.7  NEUTROABS 2.8  --   --  11.7*  --   --   HGB 7.9*   < > 10.7* 10.5* 10.9* 10.1*  HCT 26.0*   < > 34.3* 33.8* 35.1* 33.3*  MCV 80.2   < > 81.7 83.3 85.2 86.0  PLT 51*   < > 155 133* 108* 106*   < > = values in this interval not displayed.    Medications:     chlorhexidine  15 mL Mouth Rinse BID   Chlorhexidine Gluconate Cloth  6 each Topical Daily   diltiazem  90 mg Per Tube Q6H   feeding supplement (PROSource TF)  45 mL Per Tube Daily   hydrALAZINE  50 mg Per Tube Q6H   insulin aspart  0-20 Units Subcutaneous Q4H   insulin glargine-yfgn  26 Units Subcutaneous BID   lactulose  20 g Per Tube QID   mouth rinse  15 mL Mouth Rinse q12n4p   metoprolol tartrate  25 mg Per Tube BID   midodrine  10 mg Per Tube TID WC   pantoprazole  40 mg Intravenous Q12H   rifaximin  550 mg Per Tube BID      Paulene Floor, MD 01/05/2022, 9:38 AM

## 2022-01-05 NOTE — Progress Notes (Signed)
Pt taken to CT1 from 3M11 and back w/ bipap w/o complications. RT will cont to monitor as needed.

## 2022-01-05 NOTE — Progress Notes (Signed)
Daily Rounding Note  01/05/2022, 10:49 AM  LOS: 7 days   SUBJECTIVE:   Chief complaint:  bleeding esoph varices, s/p TIPS.  Decompensated NASH cirrhosis.      Urine output yest: 150 mL.  Flexiseal in place.  TF running at goal rate of 50/hour Watery brown stool output to Flexi-Seal.  Measured at 1.45 L yesterday.  No volume entered today yet.  OBJECTIVE:         Vital signs in last 24 hours:    Temp:  [97.5 F (36.4 C)-98 F (36.7 C)] 97.5 F (36.4 C) (06/13 0748) Pulse Rate:  [61-78] 66 (06/13 1000) Resp:  [11-19] 12 (06/13 1000) BP: (114-140)/(56-75) 129/73 (06/13 1000) SpO2:  [98 %-100 %] 99 % (06/13 1000) FiO2 (%):  [40 %] 40 % (06/13 0600) Weight:  [118.7 kg] 118.7 kg (06/13 0500) Last BM Date : 01/05/22 Filed Weights   01/03/22 0306 01/04/22 0242 01/05/22 0500  Weight: 112.9 kg 114.3 kg 118.7 kg   General: Lethargic.  Speaks very little. Heart: RRR. Chest: No labored breathing or cough. Abdomen: Sitting in chair so exam not ideal but abdomen feels tight especially across the upper abdomen.  No tenderness.  Bowel sounds present and normal quality but hypoactive. Extremities: Edema in upper and lower extremities.  A little bit of weeping edema noted in the top of the right hand. Neuro/Psych: Awake but lethargic.  Needs repeated prompting to follow commands.  Unable to hold her arm out and flex her wrist for asterixis testing, as soon as I let go she dropped her arm down to the pillow beneath.  Was not able to elicit asterixis.  Intake/Output from previous day: 06/12 0701 - 06/13 0700 In: 2231.4 [I.V.:532.5; NG/GT:1530; IV Piggyback:169] Out: 1600 [Urine:150; [id number]  Intake/Output this shift: No intake/output data recorded.  Lab Results: Recent Labs    01/03/22 0307 01/04/22 0314 01/05/22 0630  WBC 13.7* 10.2 9.7  HGB 10.5* 10.9* 10.1*  HCT 33.8* 35.1* 33.3*  PLT 133* 108* 106*   BMET Recent  Labs    01/03/22 1542 01/04/22 0314 01/05/22 0630  NA 141 140 139  K 4.5 4.7 4.6  CL 112* 112* 109  CO2 23 22 20*  GLUCOSE 189* 258* 158*  BUN 46* 47* 59*  CREATININE 2.85* 3.09* 3.53*  CALCIUM 10.3 10.0 10.5*   LFT Recent Labs    01/03/22 0307  PROT 7.2  ALBUMIN 2.5*  AST 30  ALT 21  ALKPHOS 84  BILITOT 2.5*   PT/INR No results for input(s): "LABPROT", "INR" in the last 72 hours. Hepatitis Panel No results for input(s): "HEPBSAG", "HCVAB", "HEPAIGM", "HEPBIGM" in the last 72 hours.  Studies/Results: No results found.  Scheduled Meds:  chlorhexidine  15 mL Mouth Rinse BID   Chlorhexidine Gluconate Cloth  6 each Topical Daily   diltiazem  90 mg Per Tube Q6H   feeding supplement (PROSource TF)  45 mL Per Tube Daily   hydrALAZINE  50 mg Per Tube Q6H   insulin aspart  0-20 Units Subcutaneous Q4H   insulin glargine-yfgn  26 Units Subcutaneous BID   lactulose  20 g Per Tube QID   mouth rinse  15 mL Mouth Rinse q12n4p   metoprolol tartrate  25 mg Per Tube BID   midodrine  10 mg Per Tube TID WC   pantoprazole  40 mg Intravenous Q12H   rifaximin  550 mg Per Tube BID   Continuous Infusions:  feeding supplement (OSMOLITE 1.5 CAL) 50 mL/hr at 01/05/22 0700   octreotide (SANDOSTATIN) 500 mcg in sodium chloride 0.9 % 250 mL (2 mcg/mL) infusion 50 mcg/hr (01/05/22 0700)   piperacillin-tazobactam (ZOSYN)  IV 12.5 mL/hr at 01/05/22 0700   PRN Meds:.albuterol, hydrALAZINE, lip balm, Racepinephrine HCl   ASSESMENT:    *   Bleeding Esophageal varices,  6/6 EGD with grade 3 esophageal varices.  1 banded, 1 varix ruptured resulting in active bleeding and need for intubation for airway protection.  Transfer to [facility] on PPI, octreotide.   01/05/2022 TIPS, coil embolization left gastric vein. Octreotide gtt, Protonix 40 IV bid in place.     Blood loss anemia.  Received 2 PRBCs on 6/6.  Hgb stable 10.9.   AKI.  Oliguric despite being 6.3 L + this admission.  Urine sodium  20, HRS?,  nephrologist D Lin favoring dx of contrast nephropathy (CIN).   #s worsening.  GFR now 14.  IV Albumin 6/10, 6/11.  Midodrine day2.  IV octreotide ongoing.  Urine output 325 mL 2 d ago, nothing recorded yesterday, 150 mL today.  Nephro following.  No current need for RRT, but could be headed in that direction.    Thrombocytopenia.  Platelets 106.  Up from nadir 51 but down from subsequent level of 155.    [place]wey anemia from blood loss and suspect anemia chronic dz.  Hgb as low as 4 at presentation to [facility].  Iron parameters WNL.       NASH cirrhosis.  Decompensated.     Dysphagia.  Core track placed.  SLP recommended NPO.  Tolerating tube feeds at goal rate of 50 mL/hour   Atrial fibrillation.  Coumadin on hold.   Possible aspiration pneumonia, background COPD.  Zosyn in place.   AMS.   Chronic rifaximin, high-dose lactulose in place.  Ammonia 64 last week.      Coagulopathy.  INR 1.8.     Bacteriuria,leukosuria, micro hematuria.  No urine clx thus far.     Moderate ascites per CT scan 6/6.  Has not had paracentesis    Bil small pleural effusions with collapse of LLL and partial collapse RLL per 6/6 CT.     PLAN   Wonder if we should drop back on the lactulose to 3 times daily rather than 4 times daily?  Continue supportive care.  In addition to chemistries will get a repeat INR in the morning    Azucena Freed  01/05/2022, 10:49 AM Phone [phone]

## 2022-01-05 NOTE — Progress Notes (Signed)
eLink Physician-Brief Progress Note Patient Name: Chelsea [name] DOB: 24-Jan-1956 MRN: [id number]   Date of Service  01/05/2022  HPI/Events of Note  Nursing reports that patient is having difficulty with verbal response. Does not follow commands with RUE, This is a change from her 8 PM assessment. Blood glucose = 207.   eICU Interventions  Plan: ABG STAT. Ammonia level STAT. Head CT Scan w/o contrast STAT.     Intervention Category Major Interventions: Other:  Lenell Antu 01/05/2022, 10:52 PM

## 2022-01-05 NOTE — Progress Notes (Signed)
Modified Barium Swallow Progress Note  Patient Details  Name: Chelsea [name] MRN: [id number] Date of Birth: 1956/04/19  Today's Date: 01/05/2022  Modified Barium Swallow completed.  Full report located under Chart Review in the Imaging Section.  Brief recommendations include the following:  Clinical Impression  Minimal oral and mild pharyngeal dysphagia without aspiration demonstrated during study. Reduced lingual elevation and palatal contact with mild lingual residue with nectar and thin. There was premature spill to valleculae with thin x 2 due to reduced lingual cupping and control. Pt was able to control and synchronize swallow with her respirations by hesitating to propel oral bolus until before expiratory phase with mild dyspnea present. Nectar thick (2nd trial) briefly entered and exited laryngeal vestibule (PAS 2) during swallow. Thin liquid with cup nor straw was penetrated or aspirated during study. Mild decreased tongue base retraction and mild vallecular residue and intermittently pyriform sinus residue cleared with spontaneous and cued second swallow. Pt demonstrated mild increased work of breathing and recommend Dys 2, thin via cup or straw and crush meds. She was unable to propel pill in puree and expectorated. Small sips and allow for rest breaks with full supervision and assist with meals. ST will continue to treat.   Swallow Evaluation Recommendations       SLP Diet Recommendations: Thin liquid;Dysphagia 2 (Fine chop) solids   Liquid Administration via: Straw;Cup   Medication Administration: Crushed with puree   Supervision: Full supervision/cueing for compensatory strategies;Staff to assist with self feeding   Compensations: Minimize environmental distractions;Slow rate;Small sips/bites   Postural Changes: Seated upright at 90 degrees   Oral Care Recommendations: Oral care BID        Royce Macadamia 01/05/2022,3:12 PM

## 2022-01-05 NOTE — Progress Notes (Signed)
Pt already on the BiPAP for the evening and resting comfortably. VSS stable, RT will cont to monitor as needed.

## 2022-01-05 NOTE — TOC Initial Note (Signed)
Transition of Care [facility]) - Initial/Assessment Note    Patient Details  Name: Chelsea [name] MRN: [id number] Date of Birth: 12/24/1955  Transition of [facility]) CM/SW Contact:    Milinda Antis, [place] Phone Number: 01/05/2022, 4:45 PM  Clinical Narrative:                 CSW received consult for possible SNF placement at time of discharge. CSW spoke with patient's sister, Chelsea [name].  Patient's sibling expressed understanding of PT recommendation and is agreeable to SNF placement at time of discharge. The family reports preference for [facility] in [place]. CSW discussed insurance authorization process and will provide Medicare SNF ratings list. Patient has received 3 COVID vaccines. CSW will send out referrals for review once patient is closer to being medically ready as patient is still ICU status at this time.  No further questions reported at this time.   Skilled Nursing Rehab Facilities-   [email or web address]   Ratings out of 5 possible   Name Address  Phone # [place] Inspection Overall  [facility] [address] [place] 4 5 2 3   [facility]  [address] [place], [place] [phone] 3 2 5 5   [facility], [facility] 3 1 1 1   [place], [place] 3 2 4 4   [facility] [address] [place] 1 1 2 1   [place], [place] 2 1 4 3   [facility] [address], [place] 5 2 3 4   [facility] [address] [place] 5 2 2 3   [address] ([place]) [place], [place] [phone] 5 1 2 2   [facility] [phone], [place] [phone] 4 1 2 1   [facility] [address], Surgical Suite Of [place] [phone] 4 1 2 1   [facility] ([place]) [place]. [place], [place] [phone] 4 1  1 1   [facility]ustin Flock [address] [place] [phone] 3 2 4 4           [place] 4 2 3 3   [facility] [place] [address], [place] 4 1 5 4   [place], [place] [phone] 2 1 1 1   [facility] [address] [employer] [phone] 2 1 3 2           [address] (no [facility]) [address], [place] [phone] 4 5 5 5   [facility] (No Hum[employer]) [address] [place], [place] 3 1 4 3   [place] (No UHC) [place], [place] [phone] 5 5 5 5   [facility] [address], [employer] [phone] 3 2 4 4   [place] [address] [place] 1 1 2 1   [place] [address], [place] [phone] 2 1 1 1   [place], [place] 5 2 4 5   [facility] [address] [place] 3 1 1 1   [facility] [address], [place] 2 1 2 1           [facility] [address] [place] [phone] 1 1 1 1   [place] [address], [place]  [phone] 2 4 2 2   [place] [address],  [place] [phone] 5 2 3 4   [facility] [place] [address], [place] 2 1 1 1   [place] (No Humana) [address], [place] [phone] 2 1 3 2   [facility] [address] [place] [phone] 3 1 1 1           [facility] [place], [place] 5 4 5 5   [facility])  [address], [place] 2 2 3 3   [facility] [place]) [place] [address] [place] 3 2 4 4   [facility] [address], [place] 4 3 4 4   [address] [place], [place] 3 3 1 1   [place] [facility]) [address] [place] [phone] 2 2 4 4      Expected Discharge Plan: [place] Barriers to Discharge: Continued Medical Work up, Ship broker, SNF Pending bed offer   Patient Goals and CMS Choice Patient states their goals for this hospitalization and ongoing recovery are:: To return home CMS Medicare.gov Compare Post Acute Care list provided to:: Patient Represenative (must comment) Choice offered to / list presented to : Sibling  Expected Discharge Plan and Services Expected Discharge Plan: [place]   Discharge Planning Services: CM Consult   Living arrangements for the past 2 months: Single Family Home                                      Prior Living Arrangements/Services Living arrangements for the past 2 months: Single Family Home Lives with:: Self Patient language and need for interpreter reviewed:: Yes Do you feel safe going back to the place where you live?: Yes      Need for Family Participation in Patient Care: Yes (Comment) Care giver support system in place?: Yes (comment)   Criminal Activity/Legal Involvement Pertinent to Current Situation/Hospitalization: No - Comment as needed  Activities of Daily Living      Permission Sought/Granted Permission sought to share information with : Case Manager, Family Supports Permission granted to share information with : Yes, Verbal Permission Granted              Emotional Assessment Appearance:: Appears stated age Attitude/Demeanor/Rapport: Engaged, Gracious Affect (typically observed): Accepting, Appropriate, Calm, Hopeful Orientation: : Oriented to Self Alcohol / Substance Use: Not Applicable Psych Involvement: No (comment)  Admission diagnosis:  Varices of esophagus determined by endoscopy ([place]) [I85.00] Acute upper GI bleed [K92.2] Patient Active Problem List   Diagnosis Date Noted   Encephalopathy, hepatic ([place])    Hypomagnesemia    Acute  respiratory failure with hypoxia (HCC)    Encephalopathy acute    Recurrent right pleural effusion    Varices of esophagus determined by endoscopy ([place]) 12/28/2021   Acute upper GI bleed 01/09/2022   Supratherapeutic INR    Acute blood loss anemia 12/16/2021   Lower extremity edema 11/30/2021   Elevated LFTs 11/30/2021   Medical non-compliance 06/01/2021   Liver cirrhosis secondary to NASH ([place]) 08/14/2020   Esophageal varices ([place]) 08/14/2020   Esophageal varices in cirrhosis ([place]) 05/05/2020   Nausea without vomiting 05/05/2020   Sleep apnea 09/10/2011   S/P subdural hematoma evacuation 11/12/2010   Chronic anticoagulation 11/12/2010   Atrial fibrillation ([place]) 09/04/2009   FINGER PAIN 09/04/2009   Diabetes mellitus ([place]) 08/07/2009   Essential hypertension, benign 08/07/2009  PALPITATIONS 08/07/2009   CHEST PAIN, PRECORDIAL 08/07/2009   PCP:  [name], MD Pharmacy:   [id number] [place], [place] [id number] [place] [id number] Phone: [phone] Fax: [phone]     Social Determinants of Health (SDOH) Interventions    Readmission Risk Interventions     No data to display

## 2022-01-05 NOTE — Progress Notes (Signed)
Physical Therapy Treatment Patient Details Name: Chelsea [name] MRN: [id number] DOB: [birth year] Today's Date: 01/05/2022   History of Present Illness 66 yo female who presented to [facility] for EGD of esophageal varices 6/6 with difficulty breathing, intubation and transferred to [facility] with TIPS. Extubated 6/7. PMhx:Iron deficiency anemia, DM type 2, A fib, NASH with cirrhosis, COPD, CHF, HLD, OSA, CVA, HTN    PT Comments    Pt with flat affect, decreased awareness and orientation this session with increased assist for standing and balance. Pt without anxiety or tachycardia this session and required lift to chair due to lack of weight shifting and may benefit from trial with bari stedy next session. Pt with bil UE edema limiting function and decreased bil UE fine motor with retrograde massage, HEP and education for elevation. Pt with lack of significant progression from eval with D/C plan updated to sNF. PT tolerating session well on RA with SPO2 93%  HR 65-72  Recommendations for follow up therapy are one component of a multi-disciplinary discharge planning process, led by the attending physician.  Recommendations may be updated based on patient status, additional functional criteria and insurance authorization.  Follow Up Recommendations  Skilled nursing-short term rehab (<3 hours/day)     Assistance Recommended at Discharge Frequent or constant Supervision/Assistance  Patient can return home with the following A lot of help with walking and/or transfers;A lot of help with bathing/dressing/bathroom;Assistance with cooking/housework;Direct supervision/assist for financial management;Assist for transportation;Help with stairs or ramp for entrance;Direct supervision/assist for medications management   Equipment Recommendations  Rolling walker (2 wheels);BSC/3in1;Hospital bed    Recommendations for Other Services       Precautions / Restrictions Precautions Precautions: Fall;Other  (comment) Precaution Comments: flexiseal, cortrak     Mobility  Bed Mobility Overal bed mobility: Needs Assistance Bed Mobility: Supine to Sit     Supine to sit: Mod assist, HOB elevated     General bed mobility comments: mod assist to pivot fully to right side of bed with pt advancing bil LE without physical assist, HOB 25 degrees with assist to fully lift trunk    Transfers Overall transfer level: Needs assistance   Transfers: Sit to/from Stand, Bed to chair/wheelchair/BSC Sit to Stand: Mod assist, +2 safety/equipment           General transfer comment: mod assist to stand x 4 trials. initial stand pt remains flexed with limited rise from surface with bil knees blocked. performed standing trials with and without +2 physical assist with most successful rise last trial with face to face technique and assist of belt/pad and pt hands on therapist elbows with pt ablet to acheive full standing but could not weight shift. Use of maxisky from EOB to Forensic psychologist: Maxisky  Ambulation/Gait               General Gait Details: not yet able   Chief Strategy Officer    Modified Rankin (Stroke Patients Only)       Balance Overall balance assessment: Needs assistance Sitting-balance support: Bilateral upper extremity supported, Feet supported Sitting balance-Leahy Scale: Poor Sitting balance - Comments: min assist-minguard for static sitting Postural control: Posterior lean Standing balance support: Bilateral upper extremity supported Standing balance-Leahy Scale: Poor Standing balance comment: bil Ue support on therapist arms in standing  Cognition Arousal/Alertness: Awake/alert Behavior During Therapy: Flat affect Overall Cognitive Status: Impaired/Different from baseline Area of Impairment: Following commands, Safety/judgement, Problem solving, Orientation, Attention, Memory                  Orientation Level: Disoriented to, Time, Situation, Place Current Attention Level: Focused Memory: Decreased short-term memory Following Commands: Follows one step commands inconsistently, Follows one step commands with increased time Safety/Judgement: Decreased awareness of safety, Decreased awareness of deficits   Problem Solving: Slow processing, Requires verbal cues, Requires tactile cues General Comments: pt with decreased responsiveness, awareness and orientation this session with delay with commands and questions        Exercises General Exercises - Upper Extremity Shoulder Flexion: AAROM, Both, 10 reps, Seated Digit Composite Flexion: AROM, Both, Seated, 10 reps General Exercises - Lower Extremity Long Arc Quad: AROM, Both, Seated, 10 reps Hip Flexion/Marching: AAROM, Both, 10 reps, Seated    General Comments        Pertinent Vitals/Pain Pain Assessment Pain Assessment: No/denies pain    Home Living                          Prior Function            PT Goals (current goals can now be found in the care plan section) Progress towards PT goals: Progressing toward goals    Frequency    Min 3X/week      PT Plan Discharge plan needs to be updated    Co-evaluation              AM-PAC PT "6 Clicks" Mobility   Outcome Measure  Help needed turning from your back to your side while in a flat bed without using bedrails?: A Little Help needed moving from lying on your back to sitting on the side of a flat bed without using bedrails?: A Lot Help needed moving to and from a bed to a chair (including a wheelchair)?: Total Help needed standing up from a chair using your arms (e.g., wheelchair or bedside chair)?: A Lot Help needed to walk in hospital room?: Total Help needed climbing 3-5 steps with a railing? : Total 6 Click Score: 10    End of Session Equipment Utilized During Treatment: Gait belt Activity Tolerance: Patient  tolerated treatment well Patient left: with call bell/phone within reach;in chair;with chair alarm set Nurse Communication: Mobility status PT Visit Diagnosis: Other abnormalities of gait and mobility (R26.89);Difficulty in walking, not elsewhere classified (R26.2)     Time: [id number] PT Time Calculation (min) (ACUTE ONLY): 25 min  Charges:  $Therapeutic Activity: 23-37 mins                     Bayard Males, PT Acute Rehabilitation Services Office: [phone]    Sandy Salaam Caelan Atchley 01/05/2022, 12:29 PM

## 2022-01-05 NOTE — Progress Notes (Signed)
NAME:  KELLSIE GERL, MRN:  [id number], DOB:  06-Aug-1955, LOS: 7 ADMISSION DATE:  01/17/2022, CONSULTATION DATE:  12/29/2021 REFERRING MD:  Dr. 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  She had admission from 12/16/21 to 12/21/21 at [facility] for Upper GI bleeding.  She was on coumadin for hx of A fib.  She had EGD on 12/29/21 at [facility] for grade 3 varices.  She had difficulty with bleeding.  She required intubation for airway protection during the procedure.  GI d/w IR about TIPS.  Pt transferred to [facility] for further management.  Hx from chart and medical team.  6/6 tx to [facility], underwent TIPS. Tx back to ICU on vent. No further bleeding.  Pertinent  Medical History  Iron deficiency anemia, DM type 2, A fib, NASH with cirrhosis, COPD, CHF, HLD, OSA, CVA, HTN  Significant Hospital Events: Including procedures, antibiotic start and stop dates in addition to other pertinent events   6/06 EGD with varix banding at [facility], intubated, start octreotide/protonix gtt, transfer to [facility] for TIPS 6/7 extubated 6/8 started on cardizem for HTN and A.fib RVR  Interim History / Subjective:  Patient serum creatinine continue to rise She remains anuric for past 2 days Remained afebrile  Objective   Blood pressure 129/73, pulse 66, temperature (!) 97.5 F (36.4 C), temperature source Oral, resp. rate 12, height 5\' 7"  (1.702 m), weight 118.7 kg, SpO2 99 %.    FiO2 (%):  [40 %] 40 %   Intake/Output Summary (Last 24 hours) at 01/05/2022 1034 Last data filed at 01/05/2022 0700 Gross per 24 hour  Intake 2231.44 ml  Output 1450 ml  Net 781.44 ml   Filed Weights   01/03/22 0306 01/04/22 0242 01/05/22 0500  Weight: 112.9 kg 114.3 kg 118.7 kg    Examination: Physical exam: General: Acute on chronically ill-appearing morbidly obese female, lying on the bed HEENT: [place]/AT, eyes anicteric.  Dry mucus  membranes Neuro: Awake, following commands, slow to respond in general Chest: Coarse breath sounds, no wheezes or rhonchi Heart: Regular rate and rhythm, no murmurs or gallops Abdomen: Soft, nontender, nondistended, bowel sounds present Skin: No rash  Assessment & Plan:  Acute upper GI bleeding from esophageal varices in setting of NASH with cirrhosis - s/p TIPS 01/19/2022. Acute hepatic encephalopathy, improved Continue Protonix twice daily Continue lactulose to per tube Had good stool output Continue rifaximin Mental status has improved  Acute hypoxic respiratory failure Probable aspiration pneumonia Probable obstructive sleep apnea Patient used BiPAP overnight, tolerated well Now she is back on on 2 L nasal cannula oxygen Titrate with O2 sat goal 92% Continue antibiotics to complete 7 days antibiotic therapy  Chronic Rt pleural effusion - present on imaging studies back to May 2022. 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 and if so, would hope to see some improvement with time after TIPS No role thora unless respiratory status worsens  Acute kidney injury due to hepatorenal syndrome versus contrast-induced nephropathy Serum creatinine continue to rise Diuretics have been on hold her serum creatinine continue to trend up She was started on octreotide, albumin and midodrine yesterday for possible HRS I think she has contrast induced nephropathy while she received contrast during TIPS GI and nephrology are following  HTN urgency, improved Paroxysmal A-fib with RVR Hypertension Chronic HFpEF  Blood pressure is better controlled now Continue antihypertensive meds Patient remained in sinus rhythm Continue  Cardizem 90 mg every 6 hours Continue metoprolol 25 mg twice daily Patient CHADVASC score is 2, she will need anticoagulation but with recent GI bleeding, currently holding off for next few days Monitor intake and output  Iron deficiency anemia in setting of Upper GI  bleeding. Continue to monitor hemoglobin and transfuse if less than 7  Poorly controlled diabetes type 2 with hyperglycemia  Patient hemoglobin A1c is 8.9 Blood sugars are better controlled now Continue Lantus to 26 units twice daily Continue sliding scale with CBG goal 140-180 Hold metformin  DNR  Best Practice (right click and "Reselect all SmartList Selections" daily)   Diet/type: Tube feeds DVT prophylaxis: SCD GI prophylaxis: PPI Lines: N/A Foley:  N/A Code Status:  DNR Last date of multidisciplinary goals of care discussion [6/12: Patient daughter was updated at bedside, she remains DNR with continue full scope of care]    Jacky Kindle, MD [facility] See Amion for pager If no response to pager, please call [phone] until 7pm After 7pm, Please call E-link [phone]

## 2022-01-06 ENCOUNTER — Inpatient Hospital Stay (HOSPITAL_COMMUNITY): Payer: No Typology Code available for payment source

## 2022-01-06 DIAGNOSIS — L8990 Pressure ulcer of unspecified site, unspecified stage: Secondary | ICD-10-CM | POA: Diagnosis present

## 2022-01-06 DIAGNOSIS — K7581 Nonalcoholic steatohepatitis (NASH): Secondary | ICD-10-CM | POA: Diagnosis not present

## 2022-01-06 DIAGNOSIS — I8500 Esophageal varices without bleeding: Secondary | ICD-10-CM | POA: Diagnosis not present

## 2022-01-06 DIAGNOSIS — I8510 Secondary esophageal varices without bleeding: Secondary | ICD-10-CM | POA: Diagnosis not present

## 2022-01-06 DIAGNOSIS — K7460 Unspecified cirrhosis of liver: Secondary | ICD-10-CM | POA: Diagnosis not present

## 2022-01-06 DIAGNOSIS — K7682 Hepatic encephalopathy: Secondary | ICD-10-CM | POA: Diagnosis not present

## 2022-01-06 LAB — PROTIME-INR
INR: 1.7 — ABNORMAL HIGH (ref 0.8–1.2)
Prothrombin Time: 19.7 seconds — ABNORMAL HIGH (ref 11.4–15.2)

## 2022-01-06 LAB — GLUCOSE, CAPILLARY
Glucose-Capillary: 179 mg/dL — ABNORMAL HIGH (ref 70–99)
Glucose-Capillary: 157 mg/dL — ABNORMAL HIGH (ref 70–99)
Glucose-Capillary: 227 mg/dL — ABNORMAL HIGH (ref 70–99)
Glucose-Capillary: 205 mg/dL — ABNORMAL HIGH (ref 70–99)
Glucose-Capillary: 211 mg/dL — ABNORMAL HIGH (ref 70–99)

## 2022-01-06 LAB — HEPATITIS C ANTIBODY: HCV Ab: NONREACTIVE

## 2022-01-06 LAB — COMPREHENSIVE METABOLIC PANEL
ALT: 22 U/L (ref 0–44)
AST: 38 U/L (ref 15–41)
Albumin: 3.1 g/dL — ABNORMAL LOW (ref 3.5–5.0)
Alkaline Phosphatase: 86 U/L (ref 38–126)
Anion gap: 8 (ref 5–15)
BUN: 65 mg/dL — ABNORMAL HIGH (ref 8–23)
CO2: 21 mmol/L — ABNORMAL LOW (ref 22–32)
Calcium: 10.5 mg/dL — ABNORMAL HIGH (ref 8.9–10.3)
Chloride: 111 mmol/L (ref 98–111)
Creatinine, Ser: 3.71 mg/dL — ABNORMAL HIGH (ref 0.44–1.00)
GFR, Estimated: 13 mL/min — ABNORMAL LOW (ref >60)
Glucose, Bld: 212 mg/dL — ABNORMAL HIGH (ref 70–99)
Potassium: 4.7 mmol/L (ref 3.5–5.1)
Sodium: 140 mmol/L (ref 135–145)
Total Bilirubin: 2.9 mg/dL — ABNORMAL HIGH (ref 0.3–1.2)
Total Protein: 7.3 g/dL (ref 6.5–8.1)

## 2022-01-06 LAB — CBC
HCT: 33.9 % — ABNORMAL LOW (ref 36.0–46.0)
Hemoglobin: 10.2 g/dL — ABNORMAL LOW (ref 12.0–15.0)
MCH: 25.6 pg — ABNORMAL LOW (ref 26.0–34.0)
MCHC: 30.1 g/dL (ref 30.0–36.0)
MCV: 85 fL (ref 80.0–100.0)
Platelets: 120 10*3/uL — ABNORMAL LOW (ref 150–400)
RBC: 3.99 MIL/uL (ref 3.87–5.11)
RDW: 26.6 % — ABNORMAL HIGH (ref 11.5–15.5)
WBC: 11 10*3/uL — ABNORMAL HIGH (ref 4.0–10.5)
nRBC: 0.2 % (ref 0.0–0.2)

## 2022-01-06 LAB — AMMONIA: Ammonia: 58 umol/L — ABNORMAL HIGH (ref 9–35)

## 2022-01-06 LAB — HEPATITIS B SURFACE ANTIGEN: Hepatitis B Surface Ag: NONREACTIVE

## 2022-01-06 LAB — HEPATITIS B SURFACE ANTIBODY,QUALITATIVE: Hep B S Ab: REACTIVE — AB

## 2022-01-06 LAB — HEPATITIS B CORE ANTIBODY, TOTAL: Hep B Core Total Ab: NONREACTIVE

## 2022-01-06 MED ORDER — FUROSEMIDE 10 MG/ML IJ SOLN
120.0000 mg | Freq: Once | INTRAMUSCULAR | Status: AC
Start: 2022-01-06 — End: 2022-01-06
  Administered 2022-01-06: 120 mg via INTRAVENOUS
  Filled 2022-01-06: qty 10

## 2022-01-06 MED ORDER — HEPARIN SODIUM (PORCINE) 1000 UNIT/ML IJ SOLN: 2.4000 mL | Freq: Once | INTRAMUSCULAR | Status: AC

## 2022-01-06 MED ORDER — HEPARIN SODIUM (PORCINE) 1000 UNIT/ML DIALYSIS
1000.0000 [IU] | INTRAMUSCULAR | Status: DC | PRN
  Administered 2022-01-07: 1000 [IU]
  Filled 2022-01-06: qty 1

## 2022-01-06 MED ORDER — ORAL CARE MOUTH RINSE
15.0000 mL | OROMUCOSAL | Status: DC | PRN
Start: 2022-01-06 — End: 2022-01-15
  Administered 2022-01-09: 15 mL via OROMUCOSAL

## 2022-01-06 MED ORDER — ALTEPLASE 2 MG IJ SOLR: 2.0000 mg | Freq: Once | INTRAMUSCULAR | Status: DC | PRN

## 2022-01-06 MED ORDER — FUROSEMIDE 10 MG/ML IJ SOLN
80.0000 mg | Freq: Once | INTRAMUSCULAR | Status: AC
  Administered 2022-01-06: 80 mg via INTRAVENOUS
  Filled 2022-01-06: qty 8

## 2022-01-06 MED ORDER — LIDOCAINE HCL (PF) 1 % IJ SOLN: 5.0000 mL | INTRAMUSCULAR | Status: DC | PRN

## 2022-01-06 MED ORDER — CHLORHEXIDINE GLUCONATE CLOTH 2 % EX PADS: 6.0000 | MEDICATED_PAD | Freq: Every day | CUTANEOUS | Status: DC

## 2022-01-06 MED ORDER — LACTULOSE 10 GM/15ML PO SOLN
20.0000 g | Freq: Two times a day (BID) | ORAL | Status: DC
  Administered 2022-01-06 – 2022-01-09 (×5): 20 g
  Filled 2022-01-06 (×6): qty 30

## 2022-01-06 MED ORDER — LIDOCAINE-PRILOCAINE 2.5-2.5 % EX CREA: 1.0000 "application " | TOPICAL_CREAM | CUTANEOUS | Status: DC | PRN

## 2022-01-06 MED ORDER — PENTAFLUOROPROP-TETRAFLUOROETH EX AERO: 1.0000 "application " | INHALATION_SPRAY | CUTANEOUS | Status: DC | PRN

## 2022-01-06 MED ORDER — HEPARIN SODIUM (PORCINE) 1000 UNIT/ML IJ SOLN
INTRAMUSCULAR | Status: AC
  Administered 2022-01-06: 2400 [IU]
  Filled 2022-01-06: qty 4

## 2022-01-06 MED ORDER — PIPERACILLIN-TAZOBACTAM IN DEX 2-0.25 GM/50ML IV SOLN
2.2500 g | Freq: Three times a day (TID) | INTRAVENOUS | Status: AC
Start: 2022-01-06 — End: 2022-01-07
  Administered 2022-01-06 (×2): 2.25 g via INTRAVENOUS
  Filled 2022-01-06 (×2): qty 50

## 2022-01-06 MED ORDER — ANTICOAGULANT SODIUM CITRATE 4% (200MG/5ML) IV SOLN: 5.0000 mL | Status: DC | PRN

## 2022-01-06 NOTE — Progress Notes (Addendum)
Daily Rounding Note  01/06/2022, 12:27 PM  LOS: 8 days   SUBJECTIVE:   Chief complaint:    Decompensated NASH.  Bleeding esophageal varices.  S/p TIPS.  Encephalopathy.  AKI.  Remains on bipap.  Ate chopped AM meal today.  Remains oliguric.  Status is waxing and waning.  Confused this morning but not so much this afternoon per RN.  OBJECTIVE:         Vital signs in last 24 hours:    Temp:  [97.5 F (36.4 C)-98.3 F (36.8 C)] 98.2 F (36.8 C) (06/14 1207) Pulse Rate:  [60-76] 61 (06/14 1015) Resp:  [13-21] 14 (06/14 1015) BP: (121-161)/(58-94) 139/69 (06/14 1000) SpO2:  [95 %-100 %] 100 % (06/14 1015) FiO2 (%):  [40 %] 40 % (06/14 0931) Weight:  [118.9 kg] 118.9 kg (06/14 0500) Last BM Date : 01/06/22 (FMS) Filed Weights   01/04/22 0242 01/05/22 0500 01/06/22 0500  Weight: 114.3 kg 118.7 kg 118.9 kg   General: She is more alert this afternoon than she was yesterday. Heart: RRR. Chest: No labored breathing or cough.  Not currently on BiPAP. Abdomen: Obese, tense/distended, nontender.  Normal quality bowel sounds without high-pitched or tympanitic quality. Extremities: Extremity edema. Neuro/Psych: Today she verbalizes that she is at [place] commands.  Was able to lift her arm, was having difficulty flexing her wrist.  I flexed her wrist and was not able to elicit asterixis  Intake/Output from previous day: 06/13 0701 - 06/14 0700 In: 2184.9 [I.V.:573.5; NG/GT:1500; IV Piggyback:111.3] Out: [id number] [Urine:875; Stool:800]  Intake/Output this shift: Total I/O In: 618 [P.O.:120; I.V.:99.9; Other:10; NG/GT:350; IV Piggyback:38.1] Out: -   Lab Results: Recent Labs    01/04/22 0314 01/05/22 0630 01/05/22 2302 01/06/22 0403  WBC 10.2 9.7  --  11.0*  HGB 10.9* 10.1* 11.2* 10.2*  HCT 35.1* 33.3* 33.0* 33.9*  PLT 108* 106*  --  120*   BMET Recent Labs    01/04/22 0314 01/05/22 0630 01/05/22 2302  01/06/22 0403  NA 140 139 144 140  K 4.7 4.6 4.7 4.7  CL 112* 109  --  111  CO2 22 20*  --  21*  GLUCOSE 258* 158*  --  212*  BUN 47* 59*  --  65*  CREATININE 3.09* 3.53*  --  3.71*  CALCIUM 10.0 10.5*  --  10.5*   LFT Recent Labs    01/06/22 0403  PROT 7.3  ALBUMIN 3.1*  AST 38  ALT 22  ALKPHOS 86  BILITOT 2.9*   PT/INR Recent Labs    01/06/22 0403  LABPROT 19.7*  INR 1.7*   Hepatitis Panel No results for input(s): "HEPBSAG", "HCVAB", "HEPAIGM", "HEPBIGM" in the last 72 hours.  Studies/Results: CT HEAD WO CONTRAST (5MM)  Result Date: 01/05/2022 IMPRESSION: 1. No acute intracranial pathology. 2. Moderate age-related atrophy and chronic microvascular ischemic changes. Old area of infarct in the left parietal lobe. Electronically Signed   By: Anner Crete M.D.   On: 01/05/2022 23:33   DG Swallowing Func-Speech Pathology  Result Date: 01/05/2022 01/05/2022   2:23 PM Clinical Impressions Clinical Impression Minimal oral and mild pharyngeal dysphagia without aspiration demonstrated during study. Reduced lingual elevation and palatal contact with mild lingual residue with nectar and thin. There was premature spill to valleculae with thin x 2 due to reduced lingual cupping and control. Pt was able to control and synchronize swallow with her respirations by hesitating to propel  oral bolus until before expiratory phase with mild dyspnea present. Nectar thick (2nd trial) briefly entered and exited laryngeal vestibule (PAS 2) during swallow. Thin liquid with cup nor straw was penetrated or aspirated during study. Mild decreased tongue base retraction and mild vallecular residue and intermittently pyriform sinus residue cleared with spontaneous and cued second swallow. Pt demonstrated mild increased work of breathing and recommend Dys 2, thin via cup or straw and crush meds. She was unable to propel pill in puree and expectorated. Small sips and allow for rest breaks with full  supervision and assist with meals. ST will continue to treat. SLP Visit Diagnosis Dysphagia, oropharyngeal phase (R13.12) Impact on safety and function Mild aspiration risk     01/05/2022   2:23 PM Treatment Recommendations Treatment Recommendations Therapy as outlined in treatment plan below     01/05/2022   2:23 PM Prognosis Prognosis for Safe Diet Advancement Good   01/05/2022   2:23 PM Diet Recommendations SLP Diet Recommendations Thin liquid;Dysphagia 2 (Fine chop) solids Liquid Administration via Straw;Cup Medication Administration Crushed with puree Compensations [place] Siren 01/05/2022, 3:11 PM        Scheduled Meds:  Chlorhexidine Gluconate Cloth  6 each Topical Daily   diltiazem  90 mg Per Tube Q6H   feeding supplement (PROSource TF)  45 mL Per Tube Daily   free water  50 mL Per Tube Q6H   hydrALAZINE  50 mg Per Tube Q6H   insulin aspart  0-20 Units Subcutaneous Q4H   insulin glargine-yfgn  26 Units Subcutaneous BID   lactulose  20 g Per Tube TID   metoprolol tartrate  25 mg Per Tube BID   midodrine  10 mg Per Tube TID WC   pantoprazole  40 mg Intravenous Q12H   rifaximin  550 mg Per Tube BID   Continuous Infusions:  feeding supplement (OSMOLITE 1.5 CAL) 50 mL/hr at 01/06/22 1100   octreotide (SANDOSTATIN) 500 mcg in sodium chloride 0.9 % 250 mL (2 mcg/mL) infusion 50 mcg/hr (01/06/22 1100)   piperacillin-tazobactam (ZOSYN)  IV     PRN Meds:.albuterol, hydrALAZINE, lip balm, mouth rinse, Racepinephrine HCl                  ASSESMENT:    *   Bleeding Esophageal varices,  6/6 EGD with grade 3 esophageal varices.  1 banded, 1 varix ruptured resulting in active bleeding and need for intubation for airway protection.  Transfer to [place] on PPI, octreotide.   01/13/2022 TIPS, coil embolization left gastric vein. Octreotide gtt, Protonix 40 IV bid in place.     Blood loss anemia.  Received 2 PRBCs on 6/6.  Hgb stable 10.9.    AKI.  worsening.  Oliguric.  Urine sodium 20,  HRS?,  nephrologist D Lin favoring dx of contrast nephropathy (CIN).   IV Albumin 6/10, 6/11.  Midodrine day 3.  IV octreotide ongoing for several days.  Urine output 495 yest, 235 mL today.  Nephro following.  No current need for RRT, but could be headed in that direction.Dr Augustin Coupe gave single dose Lasix 80 mg IV today for volume overload.     Thrombocytopenia.  Platelets 120.       [place] anemia from blood loss and suspect anemia chronic dz.  Hgb as low as 4 at presentation to [facility].  received PRBCs.  Iron parameters WNL.  Hgb 10.2 today.       NASH cirrhosis.  Decompensated.     Dysphagia.  Core track .  SLP recommended NPO.  Tolerating tube feeds at goal rate of 50 mL/hour.  SLP swallow study yesterday with Oropharyngeal dysphagia, mild aspiration risk.  Recommended dysphagia 2/fine chopped solids and thin liquids   Atrial fibrillation.  Coumadin on hold.   Possible aspiration pneumonia, background COPD.  Zosyn in place.   AMS.  CT head non-acute.  Chronic rifaximin, high-dose lactulose in place.  Ammonia 64 last week.  Per RN her level of alertness, relative confusion waxes and wanes.  This morning she was more confused, she seems less confused right now compared with yesterday during my interaction.    Coagulopathy.  INR 1.8.     Moderate ascites per CT scan 6/6.  Has not had paracentesis.  Abdomen more distended, tense but not tender.     Bil small pleural effusions with collapse of LLL and partial collapse RLL per 6/6 CT.      PLAN    Reducing lactulose to 20 g bid, continue rifaximin.  Stopping octreotide, this was cleared with the renal doctor.   ? Paracentesis, abdomen is distended.      Azucena Freed  01/06/2022, 12:27 PM Phone [phone]   GI attending:  Main issue is AKI right noiw. GI/liver issues stable. Not adding to care right now - will sign off. Please call if ?'s  Gatha Mayer, MD, [facility] See Shea Evans on call -  gastroenterology for best contact person 01/06/2022 4:54 PM

## 2022-01-06 NOTE — Progress Notes (Signed)
[facility]  Throughout day she has required BIPAP more frequently, complaining of dyspnea.  As she has not been making adequate urine we discussed with nephrology who agrees we should start hemodialysis.   HD catheter placed.  [place] Meade, MD [facility] Pager: [phone] Cell: [phone] After 7:00 pm call Elink  [phone]

## 2022-01-06 NOTE — Progress Notes (Addendum)
SLP Cancellation Note  Patient Details Name: Chelsea [name] MRN: [id number] DOB: 1956-05-27   Cancelled treatment:        Pt currently on Bipap. Per RN on Bipap this am, came off for breakfast then back on. SLP will continue efforts. Diet upgraded from clears to Dys 2.   Spoke with RN this afternoon reporting she came of Bipap- ate a few bites (did ok) and went back on. SLP will continue efforts tomorrow.   Royce Macadamia 01/06/2022, 11:44 AM
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
## 2022-01-06 NOTE — Progress Notes (Addendum)
Nutrition Follow-up  DOCUMENTATION CODES:   Obesity unspecified  INTERVENTION:   Continue tube feeds via Cortrak: - Change to Pivot 1.5 @ 55 ml/hr (1320 ml/day)  Tube feeding regimen provides 1980 kcal, 124 grams of protein, and 990 ml of H2O.  - Add B-complex with vitamin C, vitamin C 761 mg BID, and folic acid 1 mg daily per tube to account for losses with CRRT  - Encourage PO intake when pt able to tolerate being off BiPAP  NUTRITION DIAGNOSIS:   Inadequate oral intake related to dysphagia as evidenced by NPO status.  Ongoing, being addressed via tube feeds  GOAL:   Patient will meet greater than or equal to 90% of their needs  Met via TF  MONITOR:   PO intake, Labs, Weight trends, TF tolerance, I & O's  REASON FOR ASSESSMENT:   Consult Enteral/tube feeding initiation and management  ASSESSMENT:   66 year old female who presented on 6/06 for EGD for grade 3 varices. Pt experienced difficulty with bleeding and required intubation for airway protection during the procedure. PMH of NASH with cirrhosis, esophageal varices, atrial fibrillation, iron deficiency anemia, T2DM, COPD, CHF, HLD, OSA, CVA, HTN.  06/06 - s/p TIPS, L gastric vein coil embolization 06/07 - extubated 06/08 - full liquid diet, later NPO 06/09 - Cortrak placed (tip gastric) 06/13 - s/p MBS, diet advanced to clear liquids 06/14 - diet advanced to dysphagia 2 with thin liquids 06/15 - first iHD, starting CRRT  Discussed pt with RN and during ICU rounds. Pt has been unable to come off BiPAP. Pt received HD overnight with 3000 ml net UF. CRRT started today due to staffing issues and significant volume overload.  Pt on BiPAP at time of RD visit. Pt not able to be off BiPAP long enough to eat at mealtimes. Discussed with SLP. Will continue with continuous tube feeds at this time.  Admit weight: 110.2 kg Current weight: 118.9 kg  Pt with deep pitting edema to BUE and BLE.  Current TF: Osmolite  1.5 @ 50 ml/hr, ProSource TF 45 ml daily  Medications reviewed and include: SSI q 4 hours, semglee 26 units BID, lactulose 20 grams BID, IV protonix, IV abx  Labs reviewed: BUN 39, creatinine 2.48, hemoglobin 10.0, platelets 105 CBG's: 108-227 x 24 hours  UOP: 755 ml x 24 hours Stool: 475 ml via rectal tube I/O's: +4.4 L since admit  Diet Order:   Diet Order             DIET DYS 2 Room service appropriate? Yes; Fluid consistency: Thin  Diet effective now                   EDUCATION NEEDS:   Not appropriate for education at this time  Skin:  Skin Assessment: Skin Integrity Issues: Stage I: nose  Last BM:  01/06/22 rectal tube  Height:   Ht Readings from Last 1 Encounters:  01/04/2022 _0  (1.702 m)    Weight:   Wt Readings from Last 1 Encounters:  01/06/22 118.9 kg    BMI:  Body mass index is 41.05 kg/m.  Estimated Nutritional Needs:   Kcal:  1950-2150  Protein:  120-135 grams  Fluid:  1.8 L    Gustavus Bryant, MS, RD, LDN Inpatient Clinical Dietitian Please see AMiON for contact information.

## 2022-01-06 NOTE — Progress Notes (Signed)
eLink Physician-Brief Progress Note Patient Name: Chelsea [name] DOB: 04-14-1956 MRN: [id number]   Date of Service  01/06/2022  HPI/Events of Note  Review of Head CT Scan wo contrast reveals: 1. No acute intracranial pathology. 2. Moderate age-related atrophy and chronic microvascular ischemic changes. Old area of infarct in the left parietal lobe. ABG = 7.34/43/125/23.5. Ammonia level = 64 --> 58 in spite of Lactulose. Patient is also on Rifaximin. Nursing states that patient is stooling.   eICU Interventions  Plan: Continue Lactulose and Rifaximin     Intervention Category Major Interventions: Change in mental status - evaluation and management  [name] 01/06/2022, 12:24 AM

## 2022-01-06 NOTE — Progress Notes (Signed)
NAME:  Chelsea [name], MRN:  [id number], DOB:  Oct 28, 1955, LOS: 8 ADMISSION DATE:  01/21/2022, CONSULTATION DATE:  12/29/2021 REFERRING MD:  Dr. 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  She had admission from 12/16/21 to 12/21/21 at [facility] for Upper GI bleeding.  She was on coumadin for hx of A fib.  She had EGD on 12/29/21 at [facility] for grade 3 varices.  She had difficulty with bleeding.  She required intubation for airway protection during the procedure.  GI d/w IR about TIPS.  Pt transferred to [facility] for further management.  Hx from chart and medical team.  6/6 tx to [facility], underwent TIPS. Tx back to ICU on vent. No further bleeding.  Pertinent  Medical History  Iron deficiency anemia, DM type 2, A fib, NASH with cirrhosis, COPD, CHF, HLD, OSA, CVA, HTN  Significant Hospital Events: Including procedures, antibiotic start and stop dates in addition to other pertinent events   6/06 EGD with varix banding at [facility], intubated, start octreotide/protonix gtt, transfer to [facility] for TIPS 6/7 extubated 6/8 started on cardizem for HTN and A.fib RVR 6/12 renal failure developing anuria. Nephrology consulted. ? HRS. Octreotide started.  6/14 UOP starting to pick up hopefully. Lasix started.   Interim History / Subjective:  No complaints Asked to go on BiPAP for a nap. Didn't sleep well last night. Family hopeful her kidneys can improve without HD.   Objective   Blood pressure (!) 124/59, pulse 62, temperature 97.7 F (36.5 C), temperature source Axillary, resp. rate 13, height 5\' 7"  (1.702 m), weight 118.9 kg, SpO2 100 %.    FiO2 (%):  [40 %] 40 %   Intake/Output Summary (Last 24 hours) at 01/06/2022 0814 Last data filed at 01/06/2022 [id number] Gross per 24 hour  Intake 2098.17 ml  Output 1675 ml  Net 423.17 ml    Filed Weights   01/04/22 0242 01/05/22 0500 01/06/22 0500  Weight: 114.3 kg 118.7  kg 118.9 kg    Examination:  General: Obese elderly appearing female in NAD on BiPAP HEENT: [place]/AT, PERRL, no JVD Neuro: Somnolent, bud will arouse and cooperate with exam appropriately.  Chest: Clear bilateral breath sounds on BiPAP Heart: RRR, no MRG Abdomen: Soft, NT, ND Skin: Grossly intact.   Assessment & Plan:  Acute upper GI bleeding from esophageal varices in setting of NASH with cirrhosis - s/p TIPS 01/08/2022 Acute hepatic encephalopathy, improved - Protonix BID - Continue lactulose to per tube - Continue rifaximin - GI following  Acute hypoxic respiratory failure Probable aspiration pneumonia Probable obstructive sleep apnea - Continue BiPAP QHS for now, not on at home. Has not had sleep study.  - nasal cannula PRN for O2 sat goal 92% - Zosyn, stop date today 5 days total therapy.   Chronic Rt pleural effusion - present on imaging studies back to May 2022. 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 - 387mL out yesterday via straight cath. Nephrology hopeful UOP may be improving.  - lasix today per nephro - She was started on octreotide, albumin and midodrine yesterday for possible HRS. Continue for now.  - Hopefully can avoid HD. Sister has discussed with patient who would reportedly be  OK with HD if necessary.   HTN urgency, improved Paroxysmal A-fib with RVR Hypertension Chronic HFpEF  - Blood pressure is better controlled now - Continue antihypertensive meds - Patient remained in sinus rhythm - Continue Cardizem 90 mg every 6 hours - Continue metoprolol 25 mg twice daily - Patient CHADVASC score is 2, she will need anticoagulation but with recent GI bleeding, currently holding - off for next few days - Monitor intake and output  Iron  deficiency anemia in setting of Upper GI bleeding. - Continue to monitor hemoglobin and transfuse if less than 7  Poorly controlled diabetes type 2 with hyperglycemia: Patient hemoglobin A1c is 8.9 - Continue Lantus 26 units twice daily - Continue sliding scale with CBG goal 140-180 - Hold metformin  Dysphagia - Dys 2, thin via cup or straw and crush meds.  DNR  Best Practice (right click and "Reselect all SmartList Selections" daily)  Diet/type: Tube feeds DVT prophylaxis: SCD GI prophylaxis: PPI Lines: N/A Foley:  N/A Code Status:  DNR Last date of multidisciplinary goals of care discussion [6/12: Patient daughter was updated at bedside, she remains DNR with continue full scope of care]     Georgann Housekeeper, AGACNP-BC [place] for personal pager PCCM on call pager [phone] until 7pm. Please call Elink 7p-7a. [phone]  01/06/2022 8:33 AM

## 2022-01-06 NOTE — Procedures (Signed)
Central Venous Catheter Insertion Procedure Note  SHERRITA RIEDERER  [id number]  05-Jul-1956  Date:01/06/22  Time:5:21 PM   Provider Performing:Brent Khyla Mccumbers   Procedure: Insertion of Non-tunneled Central Venous Catheter(36556)with US guidance (94503)    Indication(s) Hemodialysis  Consent Risks of the procedure as well as the alternatives and risks of each were explained to the patient and/or caregiver.  Consent for the procedure was obtained and is signed in the bedside chart  Anesthesia Topical only with 1% lidocaine   Timeout Verified patient identification, verified procedure, site/side was marked, verified correct patient position, special equipment/implants available, medications/allergies/relevant history reviewed, required imaging and test results available.  Sterile Technique Maximal sterile technique including full sterile barrier drape, hand hygiene, sterile gown, sterile gloves, mask, hair covering, sterile ultrasound probe cover (if used).  Procedure Description Area of catheter insertion was cleaned with chlorhexidine and draped in sterile fashion.   With real-time ultrasound guidance a HD catheter was placed into the right internal jugular vein.  Nonpulsatile blood flow and easy flushing noted in all ports.  The catheter was sutured in place and sterile dressing applied.  Complications/Tolerance None; patient tolerated the procedure well. Chest X-ray is ordered to verify placement for internal jugular or subclavian cannulation.  Chest x-ray is not ordered for femoral cannulation.  EBL Minimal  Specimen(s) None  Heber Salinas, MD [place] PCCM Pager: [phone] Cell: [phone] After 7:00 pm call Elink  [phone]

## 2022-01-07 DIAGNOSIS — N179 Acute kidney failure, unspecified: Principal | ICD-10-CM | POA: Diagnosis not present

## 2022-01-07 DIAGNOSIS — I8500 Esophageal varices without bleeding: Secondary | ICD-10-CM | POA: Diagnosis not present

## 2022-01-07 LAB — RENAL FUNCTION PANEL
Albumin: 3 g/dL — ABNORMAL LOW (ref 3.5–5.0)
Anion gap: 9 (ref 5–15)
BUN: 36 mg/dL — ABNORMAL HIGH (ref 8–23)
CO2: 26 mmol/L (ref 22–32)
Calcium: 9.7 mg/dL (ref 8.9–10.3)
Chloride: 104 mmol/L (ref 98–111)
Creatinine, Ser: 2.33 mg/dL — ABNORMAL HIGH (ref 0.44–1.00)
GFR, Estimated: 23 mL/min — ABNORMAL LOW (ref >60)
Glucose, Bld: 155 mg/dL — ABNORMAL HIGH (ref 70–99)
Phosphorus: 4 mg/dL (ref 2.5–4.6)
Potassium: 4.5 mmol/L (ref 3.5–5.1)
Sodium: 139 mmol/L (ref 135–145)

## 2022-01-07 LAB — CBC
HCT: 31.5 % — ABNORMAL LOW (ref 36.0–46.0)
Hemoglobin: 10 g/dL — ABNORMAL LOW (ref 12.0–15.0)
MCH: 26.5 pg (ref 26.0–34.0)
MCHC: 31.7 g/dL (ref 30.0–36.0)
MCV: 83.3 fL (ref 80.0–100.0)
Platelets: 105 10*3/uL — ABNORMAL LOW (ref 150–400)
RBC: 3.78 MIL/uL — ABNORMAL LOW (ref 3.87–5.11)
RDW: 26.2 % — ABNORMAL HIGH (ref 11.5–15.5)
WBC: 10.1 10*3/uL (ref 4.0–10.5)
nRBC: 0 % (ref 0.0–0.2)

## 2022-01-07 LAB — HEPATITIS B SURFACE ANTIBODY, QUANTITATIVE: Hep B S AB Quant (Post): 18.8 m[IU]/mL (ref >9.9)

## 2022-01-07 LAB — GLUCOSE, CAPILLARY
Glucose-Capillary: 108 mg/dL — ABNORMAL HIGH (ref 70–99)
Glucose-Capillary: 132 mg/dL — ABNORMAL HIGH (ref 70–99)
Glucose-Capillary: 141 mg/dL — ABNORMAL HIGH (ref 70–99)
Glucose-Capillary: 158 mg/dL — ABNORMAL HIGH (ref 70–99)
Glucose-Capillary: 177 mg/dL — ABNORMAL HIGH (ref 70–99)
Glucose-Capillary: 181 mg/dL — ABNORMAL HIGH (ref 70–99)
Glucose-Capillary: 214 mg/dL — ABNORMAL HIGH (ref 70–99)

## 2022-01-07 LAB — BASIC METABOLIC PANEL
Anion gap: 9 (ref 5–15)
BUN: 39 mg/dL — ABNORMAL HIGH (ref 8–23)
CO2: 27 mmol/L (ref 22–32)
Calcium: 9.6 mg/dL (ref 8.9–10.3)
Chloride: 103 mmol/L (ref 98–111)
Creatinine, Ser: 2.48 mg/dL — ABNORMAL HIGH (ref 0.44–1.00)
GFR, Estimated: 21 mL/min — ABNORMAL LOW (ref >60)
Glucose, Bld: 120 mg/dL — ABNORMAL HIGH (ref 70–99)
Potassium: 3.9 mmol/L (ref 3.5–5.1)
Sodium: 139 mmol/L (ref 135–145)

## 2022-01-07 LAB — MAGNESIUM: Magnesium: 2.2 mg/dL (ref 1.7–2.4)

## 2022-01-07 LAB — PHOSPHORUS: Phosphorus: 3.8 mg/dL (ref 2.5–4.6)

## 2022-01-07 MED ORDER — PANTOPRAZOLE SODIUM 40 MG IV SOLR
40.0000 mg | Freq: Two times a day (BID) | INTRAVENOUS | Status: DC
  Administered 2022-01-07 – 2022-01-10 (×7): 40 mg via INTRAVENOUS
  Filled 2022-01-07 (×7): qty 10

## 2022-01-07 MED ORDER — B COMPLEX-C PO TABS
1.0000 | ORAL_TABLET | Freq: Every day | ORAL | Status: DC
  Administered 2022-01-07 – 2022-01-13 (×7): 1
  Filled 2022-01-07 (×7): qty 1

## 2022-01-07 MED ORDER — PRISMASOL BGK 4/2.5 32-4-2.5 MEQ/L REPLACEMENT SOLN
Status: DC
  Filled 2022-01-07 (×8): qty 5000

## 2022-01-07 MED ORDER — VITAL 1.5 CAL PO LIQD: 1000.0000 mL | ORAL | Status: DC

## 2022-01-07 MED ORDER — PIVOT 1.5 CAL PO LIQD
1000.0000 mL | ORAL | Status: DC
Start: 2022-01-07 — End: 2022-01-08
  Administered 2022-01-07 – 2022-01-08 (×2): 1000 mL
  Filled 2022-01-07 (×2): qty 1000

## 2022-01-07 MED ORDER — HEPARIN SODIUM (PORCINE) 1000 UNIT/ML DIALYSIS
1000.0000 [IU] | INTRAMUSCULAR | Status: DC | PRN
  Administered 2022-01-10: 3000 [IU] via INTRAVENOUS_CENTRAL
  Filled 2022-01-07: qty 4
  Filled 2022-01-07: qty 6

## 2022-01-07 MED ORDER — FOLIC ACID 1 MG PO TABS
1.0000 mg | ORAL_TABLET | Freq: Every day | ORAL | Status: DC
  Administered 2022-01-07 – 2022-01-13 (×7): 1 mg
  Filled 2022-01-07 (×8): qty 1

## 2022-01-07 MED ORDER — PROSOURCE TF PO LIQD: 90.0000 mL | Freq: Two times a day (BID) | ORAL | Status: DC

## 2022-01-07 MED ORDER — PRISMASOL BGK 4/2.5 32-4-2.5 MEQ/L EC SOLN
Status: DC
  Filled 2022-01-07 (×30): qty 5000

## 2022-01-07 MED ORDER — SODIUM CHLORIDE 0.9 % FOR CRRT: INTRAVENOUS_CENTRAL | Status: DC | PRN

## 2022-01-07 MED ORDER — ASCORBIC ACID 500 MG PO TABS
250.0000 mg | ORAL_TABLET | Freq: Two times a day (BID) | ORAL | Status: DC
  Administered 2022-01-07 – 2022-01-13 (×12): 250 mg
  Filled 2022-01-07 (×13): qty 1
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
## 2022-01-07 NOTE — Progress Notes (Addendum)
NAME:  Chelsea [name], MRN:  [id number], DOB:  02/04/1956, LOS: 9 ADMISSION DATE:  01/12/2022, CONSULTATION DATE:  12/29/2021 REFERRING MD:  Dr. 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  She had admission from 12/16/21 to 12/21/21 at [facility] for Upper GI bleeding.  She was on coumadin for hx of A fib.  She had EGD on 12/29/21 at [facility] for grade 3 varices.  She had difficulty with bleeding.  She required intubation for airway protection during the procedure.  GI d/w IR about TIPS.  Pt transferred to [facility] for further management.  Hx from chart and medical team.  6/6 tx to MCU, underwent TIPS. Tx back to ICU on vent. No further bleeding.  Pertinent  Medical History  Iron deficiency anemia, DM type 2, A fib, NASH with cirrhosis, COPD, CHF, HLD, OSA, CVA, HTN  Significant Hospital Events: Including procedures, antibiotic start and stop dates in addition to other pertinent events   6/06 EGD with varix banding at [facility], intubated, start octreotide/protonix gtt, transfer to [facility] for TIPS 6/7 extubated 6/8 started on cardizem for HTN and A.fib RVR 6/12 renal failure developing anuria. Nephrology consulted. ? HRS. Octreotide started.  6/14 poor UOP continued and dyspnea worsened. HD started 6/15 starting CRRT, unable to come off BiPAP.   Interim History / Subjective:  On BiPAP still No complaints CRRT getting started.  Overnight did not sleep well and was only able to come off BiPAP for very brief periods.   3L out with UF yesterday.   Objective   Blood pressure 125/76, pulse 62, temperature 97.7 F (36.5 C), temperature source Axillary, resp. rate 13, height 5\' 7"  (1.702 m), weight 118.9 kg, SpO2 99 %.    FiO2 (%):  [40 %] 40 %   Intake/Output Summary (Last 24 hours) at 01/07/2022 0905 Last data filed at 01/07/2022 0600 Gross per 24 hour  Intake 1570 ml  Output 4270 ml  Net -2700 ml     Filed Weights   01/04/22 0242 01/05/22 0500 01/06/22 0500  Weight: 114.3 kg 118.7 kg 118.9 kg    Examination:  General: obese elderly appearing patient in NAD on BiPAP HEENT: [place]/AT, PERRL, no JVD Neuro: Awake, alert, oriented x 3.  Cardio: Clear bilateral breath sounds. 1+ pitting edema b/l le Heart: RRR, no MRG Abdomen: Soft, NT, ND Skin: Grossly intact.   Assessment & Plan:  Acute upper GI bleeding from esophageal varices in setting of NASH with cirrhosis - s/p TIPS 01/20/2022 Acute hepatic encephalopathy, improved - Protonix BID - Continue lactulose to per tube - Continue rifaximin - GI following  Acute hypoxic respiratory failure: primarily d/t pulmonary edema now Probable aspiration pneumonia:  ABX completed 6/14 Probable obstructive sleep apnea - BiPAP QHS and PRN. Has been on pretty much continuously for the past 24 hours.  - Will need to determine wishes regarding intubation should she worsen.  - SpO2 goal 92% - Zosyn completed 6/14 - Aspiration precautions.   Chronic Rt pleural effusion - present on imaging studies back to May 2022. 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 - HD for volume removal started 6/14 - Transition to CRRT today in order to  expedite therapy. - HRS treatment with albumin, octreotide, midodrine. DC octreotide  HTN urgency, improved Paroxysmal A-fib with RVR Hypertension Chronic HFpEF  - Continue Cardizem 90 mg every 6 hours - Continue metoprolol 25 mg twice daily - Patient CHADVASC score is 2, she will need anticoagulation but with recent GI bleeding, currently holding off. Will need to revisit in the next 48 hours.  - Monitor intake and output  Iron deficiency anemia in setting of Upper GI  bleeding. - Continue to monitor hemoglobin and transfuse if less than 7  Poorly controlled diabetes type 2 with hyperglycemia: Patient hemoglobin A1c is 8.9 - Continue Lantus 26 units twice daily - Continue sliding scale with CBG goal 140-180 - Hold metformin  Dysphagia - Dys 2, thin via cup or straw and crush meds.  DNR Family updated at bedside  Best Practice (right click and "Reselect all SmartList Selections" daily)  Diet/type: Tube feeds DVT prophylaxis: SCD GI prophylaxis: PPI Lines: Dialysis Catheter Foley:  Yes, and it is still needed Code Status:  DNR Last date of multidisciplinary goals of care discussion [6/12: Patient daughter was updated at bedside, she remains DNR with continue full scope of care]  Critical care time 42 minutes   Joneen Roach, AGACNP-BC [facility]  See Amion for personal pager PCCM on call pager [phone] until 7pm. Please call Elink 7p-7a. [phone]  01/07/2022 9:05 AM

## 2022-01-07 NOTE — Progress Notes (Signed)
PT Cancellation Note  Patient Details Name: Chelsea [name] MRN: [id number] DOB: [birth year]   Cancelled Treatment:    Reason Eval/Treat Not Completed: Patient not medically ready;Medical issues which prohibited therapy.  Newly placed on CRRT, unable to wean off BIPAP.  Will check on 6/16 and see as appropriate. 01/07/2022  Jacinto Halim., PT Acute Rehabilitation Services [phone]  (pager) [phone]  (office)   [name] Toney Difatta 01/07/2022, 10:49 AM

## 2022-01-07 NOTE — Progress Notes (Signed)
SLP Cancellation Note  Patient Details Name: Chelsea [name] MRN: [id number] DOB: [birth year]   Cancelled treatment:        Pt on Bipap and RN stated they are trying to pull fluid off. Will continue to follow.    Royce Macadamia 01/07/2022, 11:28 AM

## 2022-01-08 ENCOUNTER — Inpatient Hospital Stay (HOSPITAL_COMMUNITY): Payer: No Typology Code available for payment source

## 2022-01-08 DIAGNOSIS — I8500 Esophageal varices without bleeding: Secondary | ICD-10-CM | POA: Diagnosis not present

## 2022-01-08 DIAGNOSIS — J918 Pleural effusion in other conditions classified elsewhere: Secondary | ICD-10-CM

## 2022-01-08 DIAGNOSIS — J810 Acute pulmonary edema: Secondary | ICD-10-CM

## 2022-01-08 LAB — CBC
HCT: 31.5 % — ABNORMAL LOW (ref 36.0–46.0)
Hemoglobin: 9.9 g/dL — ABNORMAL LOW (ref 12.0–15.0)
MCH: 26.5 pg (ref 26.0–34.0)
MCHC: 31.4 g/dL (ref 30.0–36.0)
MCV: 84.5 fL (ref 80.0–100.0)
Platelets: 73 10*3/uL — ABNORMAL LOW (ref 150–400)
RBC: 3.73 MIL/uL — ABNORMAL LOW (ref 3.87–5.11)
RDW: 26.7 % — ABNORMAL HIGH (ref 11.5–15.5)
WBC: 8.3 10*3/uL (ref 4.0–10.5)
nRBC: 0.2 % (ref 0.0–0.2)

## 2022-01-08 LAB — RENAL FUNCTION PANEL
Albumin: 2.8 g/dL — ABNORMAL LOW (ref 3.5–5.0)
Anion gap: 7 (ref 5–15)
BUN: 29 mg/dL — ABNORMAL HIGH (ref 8–23)
CO2: 25 mmol/L (ref 22–32)
Calcium: 9.4 mg/dL (ref 8.9–10.3)
Chloride: 103 mmol/L (ref 98–111)
Creatinine, Ser: 1.84 mg/dL — ABNORMAL HIGH (ref 0.44–1.00)
GFR, Estimated: 30 mL/min — ABNORMAL LOW (ref >60)
Glucose, Bld: 204 mg/dL — ABNORMAL HIGH (ref 70–99)
Phosphorus: 3.3 mg/dL (ref 2.5–4.6)
Potassium: 4.8 mmol/L (ref 3.5–5.1)
Sodium: 135 mmol/L (ref 135–145)

## 2022-01-08 LAB — BODY FLUID CELL COUNT WITH DIFFERENTIAL
Eos, Fluid: 0 %
Lymphs, Fluid: 66 %
Monocyte-Macrophage-Serous Fluid: 24 % — ABNORMAL LOW (ref 50–90)
Neutrophil Count, Fluid: 10 % (ref 0–25)
Total Nucleated Cell Count, Fluid: 239 cu mm (ref 0–1000)

## 2022-01-08 LAB — AMYLASE, PLEURAL OR PERITONEAL FLUID: Amylase, Fluid: 31 U/L

## 2022-01-08 LAB — COMPREHENSIVE METABOLIC PANEL
ALT: 23 U/L (ref 0–44)
AST: 45 U/L — ABNORMAL HIGH (ref 15–41)
Albumin: 3.1 g/dL — ABNORMAL LOW (ref 3.5–5.0)
Alkaline Phosphatase: 95 U/L (ref 38–126)
Anion gap: 9 (ref 5–15)
BUN: 31 mg/dL — ABNORMAL HIGH (ref 8–23)
CO2: 24 mmol/L (ref 22–32)
Calcium: 9.6 mg/dL (ref 8.9–10.3)
Chloride: 104 mmol/L (ref 98–111)
Creatinine, Ser: 2.01 mg/dL — ABNORMAL HIGH (ref 0.44–1.00)
GFR, Estimated: 27 mL/min — ABNORMAL LOW (ref >60)
Glucose, Bld: 171 mg/dL — ABNORMAL HIGH (ref 70–99)
Potassium: 4.5 mmol/L (ref 3.5–5.1)
Sodium: 137 mmol/L (ref 135–145)
Total Bilirubin: 3.7 mg/dL — ABNORMAL HIGH (ref 0.3–1.2)
Total Protein: 7.9 g/dL (ref 6.5–8.1)

## 2022-01-08 LAB — GLUCOSE, CAPILLARY
Glucose-Capillary: 145 mg/dL — ABNORMAL HIGH (ref 70–99)
Glucose-Capillary: 152 mg/dL — ABNORMAL HIGH (ref 70–99)
Glucose-Capillary: 168 mg/dL — ABNORMAL HIGH (ref 70–99)
Glucose-Capillary: 191 mg/dL — ABNORMAL HIGH (ref 70–99)
Glucose-Capillary: 193 mg/dL — ABNORMAL HIGH (ref 70–99)
Glucose-Capillary: 206 mg/dL — ABNORMAL HIGH (ref 70–99)

## 2022-01-08 LAB — LACTATE DEHYDROGENASE
LDH: 188 U/L (ref 98–192)
LDH: 174 U/L (ref 98–192)

## 2022-01-08 LAB — CRYOGLOBULIN

## 2022-01-08 LAB — PROTEIN, PLEURAL OR PERITONEAL FLUID: Total protein, fluid: <3 g/dL

## 2022-01-08 LAB — ALBUMIN, PLEURAL OR PERITONEAL FLUID: Albumin, Fluid: <1.5 g/dL

## 2022-01-08 LAB — PHOSPHORUS: Phosphorus: 3.6 mg/dL (ref 2.5–4.6)

## 2022-01-08 LAB — LACTATE DEHYDROGENASE, PLEURAL OR PERITONEAL FLUID: LD, Fluid: 43 U/L — ABNORMAL HIGH (ref 3–23)

## 2022-01-08 LAB — GLUCOSE, PLEURAL OR PERITONEAL FLUID: Glucose, Fluid: 206 mg/dL

## 2022-01-08 LAB — MAGNESIUM: Magnesium: 2.4 mg/dL (ref 1.7–2.4)

## 2022-01-08 LAB — PROTEIN, TOTAL: Total Protein: 7 g/dL (ref 6.5–8.1)

## 2022-01-08 MED ORDER — THROMBI-PAD 3"X3" EX PADS
1.0000 | MEDICATED_PAD | Freq: Once | CUTANEOUS | Status: AC
Start: 2022-01-08 — End: 2022-01-08
  Filled 2022-01-08: qty 1

## 2022-01-08 MED ORDER — "THROMBI-PAD 3""X3"" EX PADS"
1.0000 | MEDICATED_PAD | Freq: Once | CUTANEOUS | Status: AC
  Administered 2022-01-08: 1 via TOPICAL
  Filled 2022-01-08 (×2): qty 1

## 2022-01-08 MED ORDER — ENSURE ENLIVE PO LIQD
237.0000 mL | Freq: Three times a day (TID) | ORAL | Status: DC
  Administered 2022-01-09: 100 mL via ORAL
  Administered 2022-01-10 – 2022-01-12 (×5): 237 mL via ORAL

## 2022-01-08 MED ORDER — PIVOT 1.5 CAL PO LIQD
1000.0000 mL | ORAL | Status: DC
  Administered 2022-01-08 – 2022-01-09 (×2): 1000 mL
  Filled 2022-01-08: qty 1000

## 2022-01-08 NOTE — Progress Notes (Signed)
[facility] Progress Note   66 y.o. 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  Cr baseline is 0.7-1 as recently as 01-19-22 with worsening renal function since 6/7 which has steadily increased each day to 3.09 at time of consultation. Patient received Omnipaque x2-3 on 6/6. Patient also received Lasix from 6/6-6/10. Patient was also in afib RVR  intermittently from 6/7-6/9 treated with initially a CCB gtt and then with a Bblr.  She also had a foley catheter also from 6/6-6/11. Sister usually bedside.  Assessment/ Plan:   Acute Kidney Injury - likely secondary to contrast induced nephropathy as renal function started worsening after the needed IV contrast administration. But part of the differential is MPGN and HRS type 1. She is already being treated with Midodrine and initially Octreotide (stopped 6/14).  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 Renal function was normal prior to and then steadily worsened post contrast. Significant contrast was required for the TIPS procedure.  - UOP is better but really poor response to high dose diuretics.  - Requiring BIPAP now and unable to come off -> HD on 6/15 (came off @0400 ) and able to get 3L off but still significantly overloaded.  -> started  CRRT because of staffing issues o/w she would not get on iHD till overnight and inability to come off BiPAP   4K baths 400/400/1500 pre/post/dialysate UF 231ml/hr net and net 4.56L   Plan on continuing CRRT for now and will stop tomorrow AM to reassess renal function; if she still requires RRT then will convert to iHD. Given multiple comorbidities and prognosis she is not likely to be a long term candidate.    - Urine microscopy evaluated on 6/13 -> WBC, normomorphic RBC's, budding yeast, no RBC/ WBC casts or dysmorphic RBC's.  -Maintain MAP>65 for  optimal renal perfusion.  - Avoid nephrotoxic medications including NSAIDs and iodinated intravenous contrast exposure unless the latter is absolutely indicated.   - Preferred narcotic agents for pain control are hydromorphone, fentanyl, and methadone. Morphine should not be used.  - Avoid Baclofen and avoid oral sodium phosphate and magnesium citrate based laxatives / bowel preps.  - Continue strict Input and Output monitoring. Will monitor the patient closely with you and intervene or adjust therapy as indicated by changes in clinical status/labs   Iron deficiency anemia with h/o UGI bleed.  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  Subjective:   More alert today  and just off BiPAP -> comfortable; sister bedside.    Objective:   BP 128/63   Pulse 69   Temp 98.1 F (36.7 C) (Axillary)   Resp 17   Ht 5\' 7"  (1.702 m)   Wt 118.9 kg   SpO2 99%   BMI 41.05 kg/m   Intake/Output Summary (Last 24 hours) at 01/08/2022 0754 Last data filed at 01/08/2022 0700 Gross per 24 hour  Intake 1647.75 ml  Output 6211 ml  Net -4563.25 ml   Weight change:   Physical Exam: GEN: NAD,  NCAT, more alert today HEENT: No conjunctival pallor, EOMI NECK: Supple, no thyromegaly LUNGS: CTA B/L no rales, rhonchi or wheezing CV: RRR, No M/R/G ABD: SNDNT +BS, gut wall edema EXT: 1+ lower extremity edema   Imaging: DG  CHEST PORT 1 VIEW  Result Date: 01/06/2022 CLINICAL DATA:  Placement of right IJ central venous catheter EXAM: PORTABLE CHEST 1 VIEW COMPARISON:  Previous studies including the examination of 12/31/2021 FINDINGS: Transverse diameter of heart is increased. Central pulmonary vessels are prominent. Increased interstitial and alveolar markings are seen in both lungs suggesting pulmonary edema. Possibility underlying pneumonia is not excluded. Enteric tube is noted traversing the  esophagus with its distal portion in the stomach. There is interval placement of right IJ central venous catheter with its tip in the superior vena cava. There is blunting of both lateral CP angles. There is no pneumothorax. IMPRESSION: Cardiomegaly.  Pulmonary edema.  Bilateral pleural effusions. Electronically Signed   By: Ernie Avena M.D.   On: 01/06/2022 19:00    Labs: BMET Recent Labs  Lab 01/01/22 1630 01/02/22 0402 01/03/22 0307 01/03/22 1542 01/04/22 0314 01/05/22 0630 01/05/22 2302 01/06/22 0403 01/07/22 0421 01/07/22 1656 01/08/22 0331  NA  --  139 139 141 140 139 144 140 139 139 137   K  --  4.2 4.2 4.5 4.7 4.6 4.7 4.7 3.9 4.5 4.5  CL  --  111 109 112* 112* 109  --  111 103 104 104  CO2  --  20* 21* 23 22 20*  --  21* 27 26 24   GLUCOSE  --  280* 269* 189* 258* 158*  --  212* 120* 155* 171*  BUN  --  28* 40* 46* 47* 59*  --  65* 39* 36* 31*  CREATININE  --  1.97* 2.64* 2.85* 3.09* 3.53*  --  3.71* 2.48* 2.33* 2.01*  CALCIUM  --  10.1 10.2 10.3 10.0 10.5*  --  10.5* 9.6 9.7 9.6  PHOS 3.2 4.5 4.3  --   --   --   --   --  3.8 4.0 3.6   CBC Recent Labs  Lab 01/03/22 0307 01/04/22 0314 01/05/22 0630 01/05/22 2302 01/06/22 0403 01/07/22 0421 01/08/22 0331  WBC 13.7*   < > 9.7  --  11.0* 10.1 8.3  NEUTROABS 11.7*  --   --   --   --   --   --   HGB 10.5*   < > 10.1* 11.2* 10.2* 10.0* 9.9*  HCT 33.8*   < > 33.3* 33.0* 33.9* 31.5* 31.5*  MCV 83.3   < > 86.0  --  85.0 83.3 84.5  PLT 133*   < > 106*  --  120* 105* 73*   < > = values in this interval not displayed.    Medications:     vitamin C  250 mg Per Tube BID   B-complex with vitamin C  1 tablet Per Tube Daily   Chlorhexidine Gluconate Cloth  6 each Topical Daily   Chlorhexidine Gluconate Cloth  6 each Topical Q0600   diltiazem  90 mg Per Tube Q6H   folic acid  1 mg Per Tube Daily   hydrALAZINE  50 mg Per Tube Q6H   insulin aspart  0-20 Units Subcutaneous Q4H   insulin glargine-yfgn  26 Units  Subcutaneous BID   lactulose  20 g Per Tube BID   metoprolol tartrate  25 mg Per Tube BID   pantoprazole  40 mg Intravenous Q12H   rifaximin  550 mg Per Tube BID      01/09/22, MD 01/08/2022, 7:54 AM

## 2022-01-08 NOTE — Progress Notes (Signed)
Attending:    Subjective: Off and on BIPAP Tolerating volume removal with CRRT well Says she feels better this morning  Objective: Vitals:   01/08/22 1100 01/08/22 1123 01/08/22 1200 01/08/22 1205  BP: 97/61 (!) 146/66 120/65   Pulse: (!) 55 61 61   Resp: 17 (!) 33 (!) 21   Temp:    97.7 F (36.5 C)  TempSrc:    Axillary  SpO2: 98% 98% 100%   Weight:      Height:       FiO2 (%):  [40 %] 40 %  Intake/Output Summary (Last 24 hours) at 01/08/2022 1304 Last data filed at 01/08/2022 1200 Gross per 24 hour  Intake 1842.75 ml  Output 6675 ml  Net -4832.25 ml    General:  Resting comfortably in bed HENT: NCAT OP clear PULM: Diminished R base, normal effort CV: RRR, no mgr GI: BS+, soft, nontender MSK: normal bulk and tone Neuro: awake, alert, no distress, MAEW    CBC    Component Value Date/Time   WBC 8.3 01/08/2022 0331   RBC 3.73 (L) 01/08/2022 0331   HGB 9.9 (L) 01/08/2022 0331   HCT 31.5 (L) 01/08/2022 0331   PLT 73 (L) 01/08/2022 0331   MCV 84.5 01/08/2022 0331   MCH 26.5 01/08/2022 0331   MCHC 31.4 01/08/2022 0331   RDW 26.7 (H) 01/08/2022 0331   LYMPHSABS 0.6 (L) 01/03/2022 0307   MONOABS 1.2 (H) 01/03/2022 0307   EOSABS 0.1 01/03/2022 0307   BASOSABS 0.0 01/03/2022 0307    BMET    Component Value Date/Time   NA 137 01/08/2022 0331   K 4.5 01/08/2022 0331   CL 104 01/08/2022 0331   CO2 24 01/08/2022 0331   GLUCOSE 171 (H) 01/08/2022 0331   BUN 31 (H) 01/08/2022 0331   CREATININE 2.01 (H) 01/08/2022 0331   CREATININE 0.68 05/05/2020 1153   CALCIUM 9.6 01/08/2022 0331   GFRNONAA 27 (L) 01/08/2022 0331    CXR images none since 6/14  Impression/Plan: Acute respiratory failure with hypoxemia due to acute pulmonary edema/anasarca> continue BIPAP goal SaO2 > 88%; remove as much volume as tolerated through CRRT Bilateral pleural effusion> f/u exam this afternoon, if not off BIPAP then consider thoracentesis AKI> CRRT per renal Advanced NASH  cirrhosis/variceal GI bleed> continue protonix, rifaximin, lactulose  Updated sister bedside  My cc time 20 minutes  Heber [place], MD  PCCM Pager: [phone] Cell: [phone] After 7pm: [phone]

## 2022-01-08 NOTE — Progress Notes (Signed)
NAME:  Chelsea [name], MRN:  [id number], DOB:  [birth year], LOS: 10 ADMISSION DATE:  Jan 03, 2022, CONSULTATION DATE:  01/03/2022 REFERRING MD:  Dr. 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  She had admission from 12/16/21 to 12/21/21 at [facility] for Upper GI bleeding.  She was on coumadin for hx of A fib.  She had EGD on 2022/01/03 at [facility] for grade 3 varices.  She had difficulty with bleeding.  She required intubation for airway protection during the procedure.  GI d/w IR about TIPS.  Pt transferred to [facility] for further management.  Hx from chart and medical team.  6/6 tx to [facility]U, underwent TIPS. Tx back to ICU on vent. No further bleeding.  Pertinent  Medical History  Iron deficiency anemia, DM type 2, A fib, NASH with cirrhosis, COPD, CHF, HLD, OSA, CVA, HTN  Significant Hospital Events: Including procedures, antibiotic start and stop dates in addition to other pertinent events   6/06 EGD with varix banding at [facility], intubated, start octreotide/protonix gtt, transfer to [facility] for TIPS 6/7 extubated 6/8 started on cardizem for HTN and A.fib RVR 6/12 renal failure developing anuria. Nephrology consulted. ? HRS. Octreotide started.  6/14 poor UOP continued and dyspnea worsened. HD started 6/15 starting CRRT, unable to come off BiPAP.   Interim History / Subjective:  CRRT Removing / hr Off BiPAP and tolerating well.  No complaints  Objective   Blood pressure (!) 141/69, pulse 71, temperature 98.1 F (36.7 C), temperature source Axillary, resp. rate 20, height 5\' 7"  (1.702 m), weight 118.9 kg, SpO2 96 %.    FiO2 (%):  [40 %] 40 %   Intake/Output Summary (Last 24 hours) at 01/08/2022 0856 Last data filed at 01/08/2022 0800 Gross per 24 hour  Intake 1652.75 ml  Output 6571 ml  Net -4918.25 ml    Filed Weights   01/04/22 0242 01/05/22 0500 01/06/22 0500  Weight: 114.3 kg 118.7 kg  118.9 kg    Examination:  General: obese elderly female in NAD HEENT: [place]/AT, PERRL, no JVD Neuro: Awake, alert, oriented x 3.  Resp: Clear bilateral breath sounds  Heart: RRR, no MRG Abdomen: Soft, NT, ND Skin: Grossly intact  Assessment & Plan:  Acute upper GI bleeding from esophageal varices in setting of NASH with cirrhosis - s/p TIPS 2022-01-03 Acute hepatic encephalopathy, improved - Protonix BID - Continue lactulose to per tube - Continue rifaximin - GI following  Acute hypoxic respiratory failure: primarily d/t pulmonary edema now Probable aspiration pneumonia:  ABX completed 6/14 Probable obstructive sleep apnea - BiPAP QHS and PRN.  - SpO2 goal 92% - Zosyn completed 6/14 - Aspiration precautions.   Chronic Rt pleural effusion - present on imaging studies back to May 2022. 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 - HD for volume removal started 6/14 - now CCRT - Plan to stop CRRT tomorrow morning as assess.   HTN urgency, improved Paroxysmal A-fib with RVR Hypertension Chronic HFpEF  - Continue Cardizem 90 mg every 6 hours - Continue metoprolol 25 mg twice daily - Patient CHADVASC score is 2, she will need anticoagulation but with recent GI bleeding, currently holding off. Will  need to revisit in the next 48 hours.  - Monitor intake and output  Iron deficiency anemia in setting of Upper GI bleeding. - Continue to monitor hemoglobin and transfuse if less than 7  Poorly controlled diabetes type 2 with hyperglycemia: Patient hemoglobin A1c is 8.9 - Continue Lantus 26 units twice daily - Continue sliding scale with CBG goal 140-180 - Hold metformin  Dysphagia - Dys 2, thin via cup or straw and crush meds.  DNR Family updated  at bedside  Best Practice (right click and "Reselect all SmartList Selections" daily)  Diet/type: Tube feeds DVT prophylaxis: SCD GI prophylaxis: PPI Lines: Dialysis Catheter Foley:  Yes, and it is still needed Code Status:  DNR Last date of multidisciplinary goals of care discussion [6/12: Patient daughter was updated at bedside, she remains DNR with continue full scope of care]  Critical care time 35 minutes   Joneen Roach, AGACNP-BC [facility]  See Amion for personal pager PCCM on call pager [phone] until 7pm. Please call Elink 7p-7a. [phone]  01/08/2022 8:56 AM

## 2022-01-08 NOTE — TOC Progression Note (Signed)
Transition of Care [facility]) - Initial/Assessment Note    Patient Details  Name: Chelsea [name] MRN: [id number] Date of Birth: 12-26-1955  [facility] of [facility]) CM/SW Contact:    Ralene Bathe, LCSWA Phone Number: 01/08/2022, 4:37 PM  Clinical Narrative:                 Transition of Care Department [facility]) has reviewed patient.  Patient is receiving CRRT.  PT recommendation remains SNF.  SNF process will be initiated once patient is closer to being medically ready for d/c.  We will continue to monitor patient advancement through interdisciplinary progression rounds.     Expected Discharge Plan: Skilled Nursing Facility Barriers to Discharge: Continued Medical Work up, English as a second language teacher, SNF Pending bed offer   Patient Goals and CMS Choice Patient states their goals for this hospitalization and ongoing recovery are:: To return home CMS Medicare.gov Compare Post Acute Care list provided to:: Patient Represenative (must comment) Choice offered to / list presented to : Sibling  Expected Discharge Plan and Services Expected Discharge Plan: Skilled Nursing Facility   Discharge Planning Services: CM Consult   Living arrangements for the past 2 months: Single Family Home                                      Prior Living Arrangements/Services Living arrangements for the past 2 months: Single Family Home Lives with:: Self Patient language and need for interpreter reviewed:: Yes Do you feel safe going back to the place where you live?: Yes      Need for Family Participation in Patient Care: Yes (Comment) Care giver support system in place?: Yes (comment)   Criminal Activity/Legal Involvement Pertinent to Current Situation/Hospitalization: No - Comment as needed  Activities of Daily Living      Permission Sought/Granted Permission sought to share information with : Case Manager, Family Supports Permission granted to share information with : Yes, Verbal Permission  Granted              Emotional Assessment Appearance:: Appears stated age Attitude/Demeanor/Rapport: Engaged, Gracious Affect (typically observed): Accepting, Appropriate, Calm, Hopeful Orientation: : Oriented to Self Alcohol / Substance Use: Not Applicable Psych Involvement: No (comment)  Admission diagnosis:  Varices of esophagus determined by endoscopy (HCC) [I85.00] Acute upper GI bleed [K92.2] Patient Active Problem List   Diagnosis Date Noted   AKI (acute kidney injury) (HCC)    Pressure injury of skin 01/06/2022   Encephalopathy, hepatic (HCC)    Hypomagnesemia    Acute respiratory failure with hypoxia (HCC)    Encephalopathy acute    Recurrent right pleural effusion    Varices of esophagus determined by endoscopy (HCC) 01-06-22   Acute upper GI bleed 2022-01-06   Supratherapeutic INR    Acute blood loss anemia 12/16/2021   Lower extremity edema 11/30/2021   Elevated LFTs 11/30/2021   Medical non-compliance 06/01/2021   Liver cirrhosis secondary to NASH (HCC) 08/14/2020   Esophageal varices (HCC) 08/14/2020   Esophageal varices in cirrhosis (HCC) 05/05/2020   Nausea without vomiting 05/05/2020   Sleep apnea 09/10/2011   S/P subdural hematoma evacuation 11/12/2010   Chronic anticoagulation 11/12/2010   Atrial fibrillation (HCC) 09/04/2009   FINGER PAIN 09/04/2009   Diabetes mellitus (HCC) 08/07/2009   Essential hypertension, benign 08/07/2009   PALPITATIONS 08/07/2009   CHEST PAIN, PRECORDIAL 08/07/2009   PCP:  Richardean Chimera, MD Pharmacy:  [facility] [phone] [place], [place] - [address] [place] [address] Phone: [phone] Fax: [phone]     Social Determinants of Health (SDOH) Interventions    Readmission Risk Interventions     No data to display
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
## 2022-01-08 NOTE — Progress Notes (Signed)
Physical Therapy Treatment Patient Details Name: Chelsea [name] MRN: [id number] DOB: 03-13-1956 Today's Date: 01/08/2022   History of Present Illness 66 yo female who presented to [facility] for EGD of esophageal varices 6/6 with difficulty breathing, intubation and transferred to [facility] with TIPS. Extubated 6/7. PMhx:Iron deficiency anemia, DM type 2, A fib, NASH with cirrhosis, COPD, CHF, HLD, OSA, CVA, HTN    PT Comments    Pt limited to exercise by the CRRT.  Emphasis on graded resistance at larger muscle groups in the U and LE's.  Pt participated well and gave good effort.    Recommendations for follow up therapy are one component of a multi-disciplinary discharge planning process, led by the attending physician.  Recommendations may be updated based on patient status, additional functional criteria and insurance authorization.  Follow Up Recommendations  Skilled nursing-short term rehab (<3 hours/day)     Assistance Recommended at Discharge Frequent or constant Supervision/Assistance  Patient can return home with the following A lot of help with walking and/or transfers;A lot of help with bathing/dressing/bathroom;Assistance with cooking/housework;Direct supervision/assist for financial management;Assist for transportation;Help with stairs or ramp for entrance;Direct supervision/assist for medications management   Equipment Recommendations  Rolling walker (2 wheels);BSC/3in1;Hospital bed    Recommendations for Other Services       Precautions / Restrictions Precautions Precautions: Fall Precaution Comments: flexiseal, cortrak, CRRT     Mobility  Bed Mobility               General bed mobility comments: did not mobilize due to CRRT and back on BIPAP    Transfers                        Ambulation/Gait                   Stairs             Wheelchair Mobility    Modified Rankin (Stroke Patients Only)       Balance                                             Cognition Arousal/Alertness: Awake/alert Behavior During Therapy: WFL for tasks assessed/performed Overall Cognitive Status: Difficult to assess                                          Exercises Other Exercises Other Exercises: bil UE's completed 10 reps each of bicep curls/tricep presses with graded resistance Other Exercises: bil UE's completed 10 reps each of shd flexion (limited on CRRT port side) Other Exercises: bil LE completed 10 reps each of hip/knee flex/ext with graded resistance Other Exercises: bil LE hip Ab/add with graded assist/resistance x 10 reps Other Exercises: bil SLR x10 reps    General Comments        Pertinent Vitals/Pain Pain Assessment Pain Assessment: Faces Faces Pain Scale: No hurt Pain Intervention(s): Monitored during session    Home Living                          Prior Function            PT Goals (current goals can now be found in the care plan section) Acute Rehab  PT Goals Patient Stated Goal: return home and read PT Goal Formulation: With patient Time For Goal Achievement: 01/17/2022 Potential to Achieve Goals: Fair Progress towards PT goals: Progressing toward goals    Frequency    Min 3X/week      PT Plan Discharge plan needs to be updated    Co-evaluation              AM-PAC PT "6 Clicks" Mobility   Outcome Measure  Help needed turning from your back to your side while in a flat bed without using bedrails?: A Little Help needed moving from lying on your back to sitting on the side of a flat bed without using bedrails?: A Lot Help needed moving to and from a bed to a chair (including a wheelchair)?: Total Help needed standing up from a chair using your arms (e.g., wheelchair or bedside chair)?: A Lot Help needed to walk in hospital room?: Total Help needed climbing 3-5 steps with a railing? : Total 6 Click Score: 10    End of Session Equipment  Utilized During Treatment: Oxygen Activity Tolerance: Patient tolerated treatment well Patient left: in bed;with call bell/phone within reach;with bed alarm set Nurse Communication: Mobility status PT Visit Diagnosis: Other abnormalities of gait and mobility (R26.89);Difficulty in walking, not elsewhere classified (R26.2)     Time: 7893-8101 PT Time Calculation (min) (ACUTE ONLY): 15 min  Charges:  $Therapeutic Exercise: 8-22 mins                     01/08/2022  Jacinto Halim., PT Acute Rehabilitation Services [phone]  (pager) [phone]  (office)   [name] [place] 01/08/2022, 5:44 PM

## 2022-01-08 NOTE — Progress Notes (Signed)
Speech Language Pathology Treatment: Dysphagia  Patient Details Name: Chelsea [name] MRN: [id number] DOB: 1956/02/01 Today's Date: 01/08/2022 Time: 3846-6599 SLP Time Calculation (min) (ACUTE ONLY): 9 min  Assessment / Plan / Recommendation Clinical Impression  Pt was off Bipap when seen and eating breakfast with sister. Noted head of bed low and pt slid down in bed and educated need for more upright position. RN stated she slid her up prior to breakfast but her bottom was hurting and pt wiggled down in bed. She consumed eggs, thin milk without indications of aspiration and adequate mastication and propulsion with eggs. She and sister have a good system where pt will state she needs a break and meals are spread out over the day. Therapist reiterated this is a good method so she does not get dyspneic. Continue Dys 2/thin and ST services for safety and upgrade when able.    HPI HPI: Patient is a 66 y.o. 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 She had recent admisison 5/24 to 12/21/21 at [facility] for upper GI bleeding and had EGD on 6/6 at [facility] for grade 3 varices but had difficulty with bleeding and required intubation for airway protection during the procedure and transferred to [facility] for IR to assess TIPS.      SLP Plan  Continue with current plan of care      Recommendations for follow up therapy are one component of a multi-disciplinary discharge planning process, led by the attending physician.  Recommendations may be updated based on patient status, additional functional criteria and insurance authorization.    Recommendations  Diet recommendations: Dysphagia 2 (fine chop);Thin liquid Liquids provided via: Cup;Straw Medication Administration: Whole meds with puree Supervision: Full supervision/cueing for compensatory strategies;Staff to assist with self feeding Compensations: Slow rate;Small sips/bites Postural Changes and/or Swallow Maneuvers: Seated upright  90 degrees                Oral Care Recommendations: Oral care BID Follow Up Recommendations: Skilled nursing-short term rehab (<3 hours/day) Assistance recommended at discharge: Frequent or constant Supervision/Assistance SLP Visit Diagnosis: Dysphagia, oropharyngeal phase (R13.12) Plan: Continue with current plan of care           Royce Macadamia  01/08/2022, 9:00 AM

## 2022-01-08 NOTE — Progress Notes (Signed)
Brief Nutrition Note  Discussed pt during ICU rounds. Discussed pt with RN who reports pt is able to tolerate more time off BiPAP and is eating better. Request evaluate for adjustments in tube feeding regimen given improved PO intake. Noted meal completion of 50% documented for breakfast this morning and meal completion of 15% documented for lunch today. Pt currently on a dysphagia 2 diet with thin liquids.  Will transition pt to nocturnal tube feeds in an attempt to stimulate appetite during the day and promote PO intake. Will still try to meet majority of pt's increased nutrient needs via tube feeds until PO intake is more consistent. Discussed with RN.  Will adjust tube feeding orders: - Pivot 1.5 @ 75 ml/hr x 12 hours from 1800 to 0600 (total of 900 ml)  Nocturnal tube feeding regimen provides 1350 kcal, 84 grams of protein, and 675 ml of H2O (meets 69% of minimum kcal needs and 70% of minimum protein needs).  Will also add oral nutrition supplements to maximize kcal and protein intake via PO route: - Ensure Enlive po TID, each supplement provides 350 kcal and 20 grams of protein  RD will continue to follow pt during acute admission.   Mertie Clause, MS, RD, LDN Inpatient Clinical Dietitian Please see [facility] for contact information.

## 2022-01-08 NOTE — Procedures (Signed)
Thoracentesis  Procedure Note  Chelsea [name]  [id number]  Feb 24, 1956  Date:01/08/22  Time:4:03 PM   Provider Performing:Brent Letisha Yera   Procedure: Thoracentesis with imaging guidance (44010)  Indication(s) Pleural Effusion  Consent Risks of the procedure as well as the alternatives and risks of each were explained to the patient and/or caregiver.  Consent for the procedure was obtained and is signed in the bedside chart  Anesthesia Topical only with 1% lidocaine    Time Out Verified patient identification, verified procedure, site/side was marked, verified correct patient position, special equipment/implants available, medications/allergies/relevant history reviewed, required imaging and test results available.   Sterile Technique Maximal sterile technique including full sterile barrier drape, hand hygiene, sterile gown, sterile gloves, mask, hair covering, sterile ultrasound probe cover (if used).  Procedure Description Ultrasound was used to identify appropriate pleural anatomy for placement and overlying skin marked.  Area of drainage cleaned and draped in sterile fashion. Lidocaine was used to anesthetize the skin and subcutaneous tissue.  1300 cc's of cloudy and bloody appearing fluid was drained from the right pleural space. Catheter then removed and bandaid applied to site.   Complications/Tolerance None; patient tolerated the procedure well. Chest X-ray is ordered to confirm no post-procedural complication.   EBL Minimal   Specimen(s) Pleural fluid : cell count with diff, ldh, protein, culture, albumin, lipid analysis, cytology  [name], MD  PCCM Pager: [phone] Cell: [phone] After 7:00 pm call Elink  [phone]

## 2022-01-08 NOTE — Progress Notes (Signed)
[facility] PCCM  She was only able to stay off BIPAP for 1 hour then had dyspnea Discussed risks and benefits of therapeutic thoracentesis with patient, she was willing to proceed.  Sister gave written consent as the patient was too weak to sign.  Heber Borger, MD [place] PCCM Pager: [phone] Cell: [phone] After 7:00 pm call Elink  [phone]

## 2022-01-09 ENCOUNTER — Inpatient Hospital Stay (HOSPITAL_COMMUNITY): Payer: No Typology Code available for payment source

## 2022-01-09 DIAGNOSIS — I8500 Esophageal varices without bleeding: Secondary | ICD-10-CM | POA: Diagnosis not present

## 2022-01-09 LAB — CBC
HCT: 29.1 % — ABNORMAL LOW (ref 36.0–46.0)
Hemoglobin: 9.2 g/dL — ABNORMAL LOW (ref 12.0–15.0)
MCH: 26.6 pg (ref 26.0–34.0)
MCHC: 31.6 g/dL (ref 30.0–36.0)
MCV: 84.1 fL (ref 80.0–100.0)
Platelets: 70 10*3/uL — ABNORMAL LOW (ref 150–400)
RBC: 3.46 MIL/uL — ABNORMAL LOW (ref 3.87–5.11)
RDW: 27.1 % — ABNORMAL HIGH (ref 11.5–15.5)
WBC: 12 10*3/uL — ABNORMAL HIGH (ref 4.0–10.5)
nRBC: 0.2 % (ref 0.0–0.2)

## 2022-01-09 LAB — BASIC METABOLIC PANEL
Anion gap: 5 (ref 5–15)
BUN: 27 mg/dL — ABNORMAL HIGH (ref 8–23)
CO2: 25 mmol/L (ref 22–32)
Calcium: 9.4 mg/dL (ref 8.9–10.3)
Chloride: 104 mmol/L (ref 98–111)
Creatinine, Ser: 1.82 mg/dL — ABNORMAL HIGH (ref 0.44–1.00)
GFR, Estimated: 31 mL/min — ABNORMAL LOW (ref >60)
Glucose, Bld: 93 mg/dL (ref 70–99)
Potassium: 4.5 mmol/L (ref 3.5–5.1)
Sodium: 134 mmol/L — ABNORMAL LOW (ref 135–145)

## 2022-01-09 LAB — RENAL FUNCTION PANEL
Albumin: 2.9 g/dL — ABNORMAL LOW (ref 3.5–5.0)
Albumin: 2.9 g/dL — ABNORMAL LOW (ref 3.5–5.0)
Anion gap: 5 (ref 5–15)
Anion gap: 6 (ref 5–15)
BUN: 27 mg/dL — ABNORMAL HIGH (ref 8–23)
BUN: 26 mg/dL — ABNORMAL HIGH (ref 8–23)
CO2: 24 mmol/L (ref 22–32)
CO2: 26 mmol/L (ref 22–32)
Calcium: 9.4 mg/dL (ref 8.9–10.3)
Calcium: 9.9 mg/dL (ref 8.9–10.3)
Chloride: 105 mmol/L (ref 98–111)
Chloride: 105 mmol/L (ref 98–111)
Creatinine, Ser: 1.75 mg/dL — ABNORMAL HIGH (ref 0.44–1.00)
Creatinine, Ser: 1.74 mg/dL — ABNORMAL HIGH (ref 0.44–1.00)
GFR, Estimated: 32 mL/min — ABNORMAL LOW (ref >60)
GFR, Estimated: 32 mL/min — ABNORMAL LOW (ref >60)
Glucose, Bld: 93 mg/dL (ref 70–99)
Glucose, Bld: 194 mg/dL — ABNORMAL HIGH (ref 70–99)
Phosphorus: 2.9 mg/dL (ref 2.5–4.6)
Phosphorus: 3.2 mg/dL (ref 2.5–4.6)
Potassium: 4.5 mmol/L (ref 3.5–5.1)
Potassium: 4.6 mmol/L (ref 3.5–5.1)
Sodium: 134 mmol/L — ABNORMAL LOW (ref 135–145)
Sodium: 137 mmol/L (ref 135–145)

## 2022-01-09 LAB — MAGNESIUM: Magnesium: 2.6 mg/dL — ABNORMAL HIGH (ref 1.7–2.4)

## 2022-01-09 LAB — GLUCOSE, CAPILLARY
Glucose-Capillary: 80 mg/dL (ref 70–99)
Glucose-Capillary: 157 mg/dL — ABNORMAL HIGH (ref 70–99)
Glucose-Capillary: 207 mg/dL — ABNORMAL HIGH (ref 70–99)
Glucose-Capillary: 189 mg/dL — ABNORMAL HIGH (ref 70–99)
Glucose-Capillary: 206 mg/dL — ABNORMAL HIGH (ref 70–99)

## 2022-01-09 LAB — PHOSPHORUS: Phosphorus: 2.9 mg/dL (ref 2.5–4.6)

## 2022-01-09 MED ORDER — PIVOT 1.5 CAL PO LIQD: 1000.0000 mL | ORAL | Status: DC

## 2022-01-09 MED ORDER — LACTULOSE 10 GM/15ML PO SOLN
10.0000 g | Freq: Two times a day (BID) | ORAL | Status: DC | PRN
  Administered 2022-01-09: 10 g
  Filled 2022-01-09: qty 15

## 2022-01-09 MED ORDER — METOPROLOL TARTRATE 25 MG PO TABS
25.0000 mg | ORAL_TABLET | Freq: Two times a day (BID) | ORAL | Status: DC
  Administered 2022-01-09 – 2022-01-14 (×11): 25 mg
  Filled 2022-01-09 (×11): qty 1

## 2022-01-09 MED ORDER — PIVOT 1.5 CAL PO LIQD
1000.0000 mL | ORAL | Status: DC
  Administered 2022-01-09 – 2022-01-13 (×7): 1000 mL
  Filled 2022-01-09 (×6): qty 1000

## 2022-01-09 NOTE — Progress Notes (Signed)
Tube feed order was changed yesterday 6/16 to noctural at 75 ml/hr from 6PM to 6AM. However, due to patients coughing episode overnight, tube feeds were held and resumed this morning.  Addressed with Dr. Kendrick Fries this morning and order changed back to continuous at 55 ml/hr due to patients respiratory status.  Will continue to follow up.  Darrold Span

## 2022-01-09 NOTE — Progress Notes (Signed)
NAME:  Chelsea [name], MRN:  [id number], DOB:  10-10-1955, LOS: 55 ADMISSION DATE:  01/02/2022, CONSULTATION DATE:  6/6 REFERRING MD:  [name], CHIEF COMPLAINT:  Hematemesis   History of Present Illness:  66 y/o female with cirrhosis from NASH who had a recent esophageal variceal bleed requiring intubation and eventual TIPS.  After TIPS she has had a prolonged ICU stay for respiratory failure and renal failure.  Pertinent  Medical History  Fe Def Anemia DM2 Atrial fibrillation NASH cirrhosis COPD CHF Hyperlipidemia OSA CVA Hypertension  Significant Hospital Events: Including procedures, antibiotic start and stop dates in addition to other pertinent events   6/06 EGD with varix banding at [facility], intubated, start octreotide/protonix gtt, transfer to [facility] for TIPS 6/7 extubated 6/8 started on cardizem for HTN and A.fib RVR 6/12 renal failure developing anuria. Nephrology consulted. ? HRS. Octreotide started.  6/14 poor UOP continued and dyspnea worsened. HD started 6/15 starting CRRT, unable to come off BiPAP.  6/16 R thoracentesis> transudate, 1300 cc removed; vomited and aspirated  Interim History / Subjective:  Vomited and aspirated last night after thoracentesis, went back on BIPAP and hasn't been off it since Remains on CRRT Multiple loose stools  Objective   Blood pressure (!) 124/58, pulse 84, temperature 98.2 F (36.8 C), temperature source Axillary, resp. rate (!) 21, height _0  (1.702 m), weight 118.9 kg, SpO2 100 %.    FiO2 (%):  [40 %] 40 %   Intake/Output Summary (Last 24 hours) at 01/09/2022 0728 Last data filed at 01/09/2022 0700 Gross per 24 hour  Intake 1848.75 ml  Output 6941 ml  Net -5092.25 ml   Filed Weights   01/04/22 0242 01/05/22 0500 01/06/22 0500  Weight: 114.3 kg 118.7 kg 118.9 kg    Examination:  General:  Resting comfortably in bed on NIMV HENT: NCAT OP clear NIMV mask in plac PULM: CTA B, normal effort CV: RRR, no mgr GI: BS+,  soft, nontender MSK: normal bulk and tone Neuro: drowsy but will wake up, answers questions but drifts off to sleep   Resolved Hospital Problem list   Hypertensive urgency  Assessment & Plan:  Acute upper GI bleeding from GI bleeding from esophageal varices in setting of NASH cirrhosis requiring TIPS Acute hepatic encephalopathy Change ppi to daily via tube Monitor for bleeding Continue rifaximin Lactulose titrated to 3 stools a day Keep K > 4  Acute hypoxemic respiratory failure due to volume overload> little progress this week despite CRRT and thoracentesis Aspiration pneumonia, completed antibiotics 6/14 Aspirated 6/16 but doesn't have pneumonia OSA R pleural effusion> chronic hepatic hydrothorax.  No role for chest tube as it will continually recur Continue NIMV Aspiration precautions Monitor for fever, worsening cough congestion BIPAP qHS  AKI presumed contrast induced nephropathy CRRT per renal Remove as much fluid as possible  Paroxysmal atrial fibrillation with RVR Hypertension Chronic HFpER Tele metoprolol  Fe def Anemia Monitor for bleeding Transfuse PRBC for Hgb < 7 gm/dL  DM2 with hyperglycemia SSI, glargine to continue at current dosing  Dysphagia Aspiration precautions Change tube feedings back to continuous because I don't anticipate she'll take much by mouth today  Goals of care: have met with both sisters Plan to have a formal goals of care conversation on 6/18 after stopping CRRT.  Her overall prognosis is poor.  Best Practice (right click and "Reselect all SmartList Selections" daily)   Diet/type: tubefeeds DVT prophylaxis: SCD GI prophylaxis: PPI Lines: Dialysis Catheter and yes and it is still  needed Foley:  Yes, and it is still needed Code Status:  DNR Last date of multidisciplinary goals of care discussion [6/12, planning to discuss again on 6/18]     Critical care time: 38 minutes     Roselie Awkward, MD [facility] Pager:  [phone] Cell: [phone] After 7:00 pm call Elink  [phone]

## 2022-01-09 NOTE — Progress Notes (Signed)
eLink Physician-Brief Progress Note Patient Name: Chelsea [name] DOB: [birth year] MRN: [id number]   Date of Service  01/09/2022  HPI/Events of Note  Patient reportedly had an aspiration event with coughing at the beginning of the shift and now the white cell count is up to 12 K from a baseline of 8.3 K.  eICU Interventions  Portable CXR ordered to r/o aspiration pneumonia.        Thomasene Lot Kaelin Holford 01/09/2022, 4:43 AM

## 2022-01-09 NOTE — Progress Notes (Signed)
[facility] Progress Note   66 y.o. 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  Cr baseline is 0.7-1 as recently as 12/30/2021 with worsening renal function since 6/7 which has steadily increased each day to 3.09 at time of consultation. Patient received Omnipaque 179ml x2-3 on 6/6. Patient also received Lasix from 6/6-6/10. Patient was also in afib RVR  intermittently from 6/7-6/9 treated with initially a CCB gtt and then with a Bblr.  She also had a foley catheter also from 6/6-6/11. Sister usually bedside.  Assessment/ Plan:   Acute Kidney Injury - likely secondary to contrast induced nephropathy as renal function started worsening after the needed IV contrast administration. But part of the differential is MPGN and HRS type 1. She is already being treated with Midodrine and initially Octreotide (stopped 6/14).  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 Renal function was normal prior to and then steadily worsened post contrast. Significant contrast was required for the TIPS procedure.  - UOP is better but really poor response to high dose diuretics.  - Requiring BIPAP now and unable to come off -> HD on 6/15 (came off @0400 ) and able to get 3L off but still significantly overloaded and then started on CRRT 6/15). Neg 5.2L during this hospitalization (admitted 6/6)  Seen on CRRT 4K baths 400/400/1500 pre/post/dialysate UF 239ml/hr net and net neg 5.2 L during this hospitalization.  Plan on continuing CRRT till Sunday 7AM and then will stop. If she still requires RRT then will convert to [facility]. Given multiple comorbidities and prognosis she is not likely to be a long term candidate.    - Urine microscopy evaluated on 6/13 -> WBC, normomorphic RBC's, budding yeast, no RBC/ WBC casts or dysmorphic RBC's.  -Maintain MAP>65 for optimal renal  perfusion.  - Avoid nephrotoxic medications including NSAIDs and iodinated intravenous contrast exposure unless the latter is absolutely indicated.   - Preferred narcotic agents for pain control are hydromorphone, fentanyl, and methadone. Morphine should not be used.  - Avoid Baclofen and avoid oral sodium phosphate and magnesium citrate based laxatives / bowel preps.  - Continue strict Input and Output monitoring. Will monitor the patient closely with you and intervene or adjust therapy as indicated by changes in clinical status/labs   Iron deficiency anemia with h/o UGI bleed.  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  Subjective:   More alert today  and back on BiPAP -> comfortable; sister bedside. Tolerating CRRT. Thoracentesis 6/16 with 1300 mL cloudy bloody fluid rt side.    Objective:   BP (!) 141/58   Pulse 84   Temp 98.2 F (36.8 C) (Axillary)   Resp (!) 24   Ht 5\' 7"  (1.702 m)   Wt 118.9 kg   SpO2 100%   BMI 41.05 kg/m   Intake/Output Summary (Last 24 hours) at 01/09/2022 0736 Last data filed at 01/09/2022 0700 Gross per 24 hour  Intake 1848.75 ml  Output 6941 ml  Net -5092.25 ml   Weight change:   Physical Exam: GEN: NAD,  NCAT, more alert today HEENT: No conjunctival pallor, EOMI NECK: Supple, no thyromegaly LUNGS: CTA B/L no rales, rhonchi or wheezing CV: RRR, No M/R/G ABD: SNDNT +BS, gut wall edema EXT: 1+ lower extremity  edema   Imaging: DG Chest Port 1 View  Result Date: 01/09/2022 CLINICAL DATA:  Evaluate pleural effusions, pneumothorax. EXAM: PORTABLE CHEST 1 VIEW COMPARISON:  Portable chest yesterday at 5:01 p.m. FINDINGS: 4:48 a.m., 01/09/2022. Double lumen right IJ catheter again has its tip at the superior cavoatrial junction with feeding tube entering the stomach with the intragastric course not filmed. The heart is enlarged. Perihilar  vascular congestion and mild-to-moderate interstitial edema continue to be seen slightly worsened in the interval. There is improvement in aeration in the left lower lung field. No pneumothorax is seen post thoracentesis. Small right and minimal left pleural effusions are noted with increased interstitial haziness in the right base which could be increasing atelectasis or consolidation. There is no change in aeration of the upper zones which show faint patchy ground-glass opacities consistent with ground-glass edema or pneumonitis. The mediastinum is stable. IMPRESSION: 1. No post thoracentesis pneumothorax is seen. 2. Perihilar vascular congestion and mild-to-moderate interstitial edema slightly worsened. 3. Improved left lower zonal aeration with only minimal pleural effusion with small right pleural effusion again noted. 4. Increased right lower zonal haziness consistent with increased atelectasis or consolidation. 5. Stable patchy haziness in the upper zones. Electronically Signed   By: Almira Bar M.D.   On: 01/09/2022 05:44   DG CHEST PORT 1 VIEW  Result Date: 01/08/2022 CLINICAL DATA:  Status post thoracentesis EXAM: PORTABLE CHEST 1 VIEW COMPARISON:  Previous studies including the examination of 01/06/2022 FINDINGS: There is improvement in aeration of right hemithorax, possibly due to decrease in pleural effusion. Transverse diameter of heart is increased. Central pulmonary vessels are prominent. Increased interstitial and alveolar markings are seen in the parahilar regions and lower lung fields, more so on the left side. There is blunting of both lateral CP angles. There is no pneumothorax. Tip of dialysis catheter is seen in the superior vena cava close to the right atrium. Enteric tube is noted traversing the esophagus. IMPRESSION: There is improvement in aeration of right hemithorax suggesting decrease in right pleural effusion. Cardiomegaly. Central pulmonary vessels are prominent with increased  interstitial and alveolar markings suggesting CHF. Bilateral pleural effusions, more so on the left side. There is no pneumothorax. Electronically Signed   By: Ernie Avena M.D.   On: 01/08/2022 17:39    Labs: BMET Recent Labs  Lab 01/03/22 0307 01/03/22 1542 01/05/22 0630 01/05/22 2302 01/06/22 0403 01/07/22 0421 01/07/22 1656 01/08/22 0331 01/08/22 1611 01/09/22 0303  NA 139   < > 139 144 140 139 139 137 135 134*  134*  K 4.2   < > 4.6 4.7 4.7 3.9 4.5 4.5 4.8 4.5  4.5  CL 109   < > 109  --  111 103 104 104 103 104  105  CO2 21*   < > 20*  --  21* 27 26 24 25 25  24   GLUCOSE 269*   < > 158*  --  212* 120* 155* 171* 204* 93  93  BUN 40*   < > 59*  --  65* 39* 36* 31* 29* 27*  27*  CREATININE 2.64*   < > 3.53*  --  3.71* 2.48* 2.33* 2.01* 1.84* 1.82*  1.75*  CALCIUM 10.2   < > 10.5*  --  10.5* 9.6 9.7 9.6 9.4 9.4  9.4  PHOS 4.3  --   --   --   --  3.8 4.0 3.6 3.3 2.9  2.9   < > = values in this interval  not displayed.   CBC Recent Labs  Lab 01/03/22 0307 01/04/22 0314 01/06/22 0403 01/07/22 0421 01/08/22 0331 01/09/22 0303  WBC 13.7*   < > 11.0* 10.1 8.3 12.0*  NEUTROABS 11.7*  --   --   --   --   --   HGB 10.5*   < > 10.2* 10.0* 9.9* 9.2*  HCT 33.8*   < > 33.9* 31.5* 31.5* 29.1*  MCV 83.3   < > 85.0 83.3 84.5 84.1  PLT 133*   < > 120* 105* 73* 70*   < > = values in this interval not displayed.    Medications:     vitamin C  250 mg Per Tube BID   B-complex with vitamin C  1 tablet Per Tube Daily   Chlorhexidine Gluconate Cloth  6 each Topical Daily   diltiazem  90 mg Per Tube Q6H   feeding supplement  237 mL Oral TID BM   feeding supplement (PIVOT 1.5 CAL)  1,000 mL Per Tube Q24H   folic acid  1 mg Per Tube Daily   insulin aspart  0-20 Units Subcutaneous Q4H   insulin glargine-yfgn  26 Units Subcutaneous BID   lactulose  20 g Per Tube BID   metoprolol tartrate  25 mg Per Tube BID   pantoprazole  40 mg Intravenous Q12H   rifaximin  550 mg Per  Tube BID      Paulene Floor, MD 01/09/2022, 7:36 AM

## 2022-01-10 DIAGNOSIS — I8500 Esophageal varices without bleeding: Secondary | ICD-10-CM | POA: Diagnosis not present

## 2022-01-10 LAB — CBC
HCT: 27.4 % — ABNORMAL LOW (ref 36.0–46.0)
Hemoglobin: 8.7 g/dL — ABNORMAL LOW (ref 12.0–15.0)
MCH: 27.4 pg (ref 26.0–34.0)
MCHC: 31.8 g/dL (ref 30.0–36.0)
MCV: 86.2 fL (ref 80.0–100.0)
Platelets: 63 K/uL — ABNORMAL LOW (ref 150–400)
RBC: 3.18 MIL/uL — ABNORMAL LOW (ref 3.87–5.11)
RDW: 27.9 % — ABNORMAL HIGH (ref 11.5–15.5)
WBC: 11.9 K/uL — ABNORMAL HIGH (ref 4.0–10.5)
nRBC: 0.3 % — ABNORMAL HIGH (ref 0.0–0.2)

## 2022-01-10 LAB — GLUCOSE, CAPILLARY
Glucose-Capillary: 122 mg/dL — ABNORMAL HIGH (ref 70–99)
Glucose-Capillary: 129 mg/dL — ABNORMAL HIGH (ref 70–99)
Glucose-Capillary: 142 mg/dL — ABNORMAL HIGH (ref 70–99)
Glucose-Capillary: 148 mg/dL — ABNORMAL HIGH (ref 70–99)
Glucose-Capillary: 157 mg/dL — ABNORMAL HIGH (ref 70–99)
Glucose-Capillary: 168 mg/dL — ABNORMAL HIGH (ref 70–99)
Glucose-Capillary: 98 mg/dL (ref 70–99)

## 2022-01-10 LAB — BASIC METABOLIC PANEL
Anion gap: 3 — ABNORMAL LOW (ref 5–15)
BUN: 27 mg/dL — ABNORMAL HIGH (ref 8–23)
CO2: 26 mmol/L (ref 22–32)
Calcium: 9.7 mg/dL (ref 8.9–10.3)
Chloride: 106 mmol/L (ref 98–111)
Creatinine, Ser: 1.71 mg/dL — ABNORMAL HIGH (ref 0.44–1.00)
GFR, Estimated: 33 mL/min — ABNORMAL LOW (ref >60)
Glucose, Bld: 153 mg/dL — ABNORMAL HIGH (ref 70–99)
Potassium: 4.6 mmol/L (ref 3.5–5.1)
Sodium: 135 mmol/L (ref 135–145)

## 2022-01-10 LAB — RENAL FUNCTION PANEL
Albumin: 2.9 g/dL — ABNORMAL LOW (ref 3.5–5.0)
Albumin: 2.6 g/dL — ABNORMAL LOW (ref 3.5–5.0)
Anion gap: 4 — ABNORMAL LOW (ref 5–15)
Anion gap: 5 (ref 5–15)
BUN: 27 mg/dL — ABNORMAL HIGH (ref 8–23)
BUN: 39 mg/dL — ABNORMAL HIGH (ref 8–23)
CO2: 25 mmol/L (ref 22–32)
CO2: 27 mmol/L (ref 22–32)
Calcium: 9.7 mg/dL (ref 8.9–10.3)
Calcium: 10 mg/dL (ref 8.9–10.3)
Chloride: 105 mmol/L (ref 98–111)
Chloride: 101 mmol/L (ref 98–111)
Creatinine, Ser: 1.65 mg/dL — ABNORMAL HIGH (ref 0.44–1.00)
Creatinine, Ser: 2.16 mg/dL — ABNORMAL HIGH (ref 0.44–1.00)
GFR, Estimated: 34 mL/min — ABNORMAL LOW
GFR, Estimated: 25 mL/min — ABNORMAL LOW
Glucose, Bld: 153 mg/dL — ABNORMAL HIGH (ref 70–99)
Glucose, Bld: 154 mg/dL — ABNORMAL HIGH (ref 70–99)
Phosphorus: 3.1 mg/dL (ref 2.5–4.6)
Phosphorus: 3.6 mg/dL (ref 2.5–4.6)
Potassium: 4.5 mmol/L (ref 3.5–5.1)
Potassium: 4.7 mmol/L (ref 3.5–5.1)
Sodium: 134 mmol/L — ABNORMAL LOW (ref 135–145)
Sodium: 133 mmol/L — ABNORMAL LOW (ref 135–145)

## 2022-01-10 LAB — MAGNESIUM: Magnesium: 2.6 mg/dL — ABNORMAL HIGH (ref 1.7–2.4)

## 2022-01-10 MED ORDER — PANTOPRAZOLE 2 MG/ML SUSPENSION
40.0000 mg | Freq: Two times a day (BID) | ORAL | Status: DC
  Administered 2022-01-10 – 2022-01-14 (×8): 40 mg
  Filled 2022-01-10 (×8): qty 20

## 2022-01-10 NOTE — Progress Notes (Signed)
NAME:  Chelsea [name], MRN:  [id number], DOB:  28-Oct-1955, LOS: 51 ADMISSION DATE:  01/20/2022, CONSULTATION DATE:  6/6 REFERRING MD:  [name], CHIEF COMPLAINT:  Hematemesis   History of Present Illness:  66 y/o female with cirrhosis from NASH who had a recent esophageal variceal bleed requiring intubation and eventual TIPS.  After TIPS she has had a prolonged ICU stay for respiratory failure and renal failure.  Pertinent  Medical History  Fe Def Anemia DM2 Atrial fibrillation NASH cirrhosis COPD CHF Hyperlipidemia OSA CVA Hypertension  Significant Hospital Events: Including procedures, antibiotic start and stop dates in addition to other pertinent events   6/06 EGD with varix banding at [facility], intubated, start octreotide/protonix gtt, transfer to [facility] for TIPS 6/7 extubated 6/8 started on cardizem for HTN and A.fib RVR 6/12 renal failure developing anuria. Nephrology consulted. ? HRS. Octreotide started.  6/14 poor UOP continued and dyspnea worsened. HD started 6/15 starting CRRT, unable to come off BiPAP.  6/16 R thoracentesis> transudate, 1300 cc removed; vomited and aspirated 6/18 off BIPAP, CRRT held, planning family conversation  Interim History / Subjective:   Off BIPAP since early this morning Made a little urine Stopped CRRT  Objective   Blood pressure 123/70, pulse 82, temperature 97.9 F (36.6 C), temperature source Oral, resp. rate (!) 24, height '5\' 7"'  (1.702 m), weight 103.2 kg, SpO2 100 %.    FiO2 (%):  [40 %] 40 %   Intake/Output Summary (Last 24 hours) at 01/10/2022 0730 Last data filed at 01/10/2022 0600 Gross per 24 hour  Intake 1998 ml  Output 6802 ml  Net -4804 ml   Filed Weights   01/05/22 0500 01/06/22 0500 01/10/22 0500  Weight: 118.7 kg 118.9 kg 103.2 kg    Examination:  General:  Resting comfortably in bed HENT: NCAT OP clear cor track in place PULM: Diminished R base, clear on left, normal effort CV: RRR, no mgr GI: BS+, soft,  nontender MSK: normal bulk and tone Neuro: awake, slow to speak, but mAEW, eating    Resolved Hospital Problem list   Hypertensive urgency  Assessment & Plan:  Acute upper GI bleeding from GI bleeding from esophageal varices in setting of NASH cirrhosis requiring TIPS Acute hepatic encephalopathy Continue PPI Monitor for bleeding Continue rifaximin Lactulose titrated to 3 stools a day K > 4  Acute hypoxemic respiratory failure due to volume overload> little progress this week despite CRRT and thoracentesis Aspiration pneumonia, completed antibiotics 6/14 Aspirated 6/16 but doesn't have pneumonia 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 OSA No role for chest tube NIMV prn and qHS Aspiration precautions Monitor for fever, worsening cough or congestion  AKI presumed contrast induced nephropathy; CRRT held on 6/18 Not a dialysis candidate Monitor BMET and UOP Replace electrolytes as needed  Paroxysmal atrial fibrillation with RVR Hypertension Chronic HFpER Tele metoprolol  Fe def Anemia Monitor for bleeding Transfuse PRBC for Hgb < 7 gm/dL  DM2 with hyperglycemia SSI, glargine at current dose  Dysphagia Continue tube feeding for now and meals by mouth Aspiration precautions  Goals of care: have met with both sisters Planning to discuss goals of care with her sisters on 6/18. Her overall prognosis is poor and she is not a long term hemodialysis candidate.  Best Practice (right click and "Reselect all SmartList Selections" daily)   Diet/type: tubefeeds DVT prophylaxis: SCD GI prophylaxis: PPI Lines: Dialysis Catheter and yes and it is still needed Foley:  Yes, and it is still needed Code Status:  DNR Last  date of multidisciplinary goals of care discussion [6/12, planning to discuss again on 6/18]     Critical care time: 35 minutes     Roselie Awkward, MD [place] [facility] Pager: [phone] Cell: [phone] After 7:00 pm call Elink  [phone]

## 2022-01-10 NOTE — IPAL (Signed)
  Interdisciplinary Goals of Care Family Meeting   Date carried out: 01/10/2022  Location of the meeting: Bedside  Member's involved: Physician and Family Member or next of kin  Durable Power of Attorney or Environmental health practitioner: patient and her two sisters and a sister in law  Discussion: We discussed goals of care for [employer] .  I explained that we have reached the end of our time limited trial of CRRT and she has improved.  We will now wait for her kidneys to recover.  If they do not we do not recommend further hemodialysis because she has so many chronic medical problems and generalized weakness it is unlikely that dialysis will be helpful.  If she worsens we recommend hospice.  They are considering this and will let us know if they agree with this plan.  They understood the ramifications of the situation and understand why further hemodialysis would likely not be beneficial.  They will take it one day at a time and see if her kidney function returns.  She remains DNR and will use BIPAP at night.  However if she needs it again during the daytime for acute respiratory failure then they understand that we would recommend comfort measures.   Code status: Full DNR  Disposition: Continue current acute care  Time spent for the meeting: 40 minutes    [place] , MD  01/10/2022, 4:35 PM

## 2022-01-10 NOTE — Progress Notes (Signed)
RT at bedside this AM to find pt on 4L [place] and BiPAP on stby. Pt tolerating well with SVS. RT will continue to monitor pt.

## 2022-01-10 NOTE — Progress Notes (Signed)
[name] Progress Note   66 y.o. 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  Cr baseline is 0.7-1 as recently as 01/22/2022 with worsening renal function since 6/7 which has steadily increased each day to 3.09 at time of consultation. Patient received Omnipaque x2-3 on 6/6. Patient also received Lasix from 6/6-6/10. Patient was also in afib RVR  intermittently from 6/7-6/9 treated with initially a CCB gtt and then with a Bblr.  She also had a foley catheter also from 6/6-6/11. Sister usually bedside.  Assessment/ Plan:   Acute Kidney Injury - likely secondary to contrast induced nephropathy as renal function started worsening after the needed IV contrast administration. But part of the differential is MPGN and HRS type 1. She is already being treated with Midodrine and initially Octreotide (stopped 6/14).  Urine microscopy evaluated on 6/13 -> WBC, normomorphic RBC's, budding yeast, no RBC/ WBC casts or dysmorphic RBC's. 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 Renal function was normal prior to and then steadily worsened post contrast. Significant contrast was required for the TIPS procedure.  - UOP is better but really poor response to high dose diuretics -> HD on 6/15 (came off @0400 ) and able to get 3L off but still significantly overloaded and then started on CRRT 6/15-6/18. Currently off BiPAP.  If UOP inadequate would try high dose diuretics again with time limited iHD when needed. Not a long term dialysis candidate.  -Maintain MAP>65 for optimal renal perfusion.  - Avoid nephrotoxic medications including NSAIDs and iodinated intravenous contrast exposure unless the latter is absolutely indicated.   - Preferred narcotic agents for pain control are hydromorphone, fentanyl, and methadone. Morphine should not be used.  - Avoid Baclofen  and avoid oral sodium phosphate and magnesium citrate based laxatives / bowel preps.  - Continue strict Input and Output monitoring. Will monitor the patient closely with you and intervene or adjust therapy as indicated by changes in clinical status/labs   Iron deficiency anemia with h/o UGI bleed.  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  Subjective:   More alert today  and off BiPAP -> comfortable; sister bedside. CRRT off this AM. Thoracentesis 6/16 with 1300 mL cloudy bloody fluid rt side.    Objective:   BP 111/65   Pulse 86   Temp 98.1 F (36.7 C) (Oral)   Resp (!) 24   Ht 5\' 7"  (1.702 m)   Wt 103.2 kg   SpO2 100%   BMI 35.63 kg/m   Intake/Output Summary (Last 24 hours) at 01/10/2022 0855 Last data filed at 01/10/2022 0800 Gross per 24 hour  Intake 2033 ml  Output 6527 ml  Net -4494 ml   Weight change:   Physical Exam: GEN: NAD,  NCAT, more alert today HEENT: No conjunctival pallor, EOMI NECK: Supple, no thyromegaly LUNGS: CTA B/L no rales, rhonchi or wheezing CV: RRR, No M/R/G ABD: SNDNT +BS, gut wall edema EXT: 1+ lower extremity edema   Imaging: DG Chest Port 1 View  Result Date: 01/09/2022 CLINICAL DATA:  Evaluate pleural effusions, pneumothorax. EXAM: PORTABLE CHEST 1 VIEW COMPARISON:  Portable chest yesterday at 5:01 p.m. FINDINGS: 4:48 a.m., 01/09/2022. Double lumen right IJ catheter again has its tip at the superior cavoatrial junction with  feeding tube entering the stomach with the intragastric course not filmed. The heart is enlarged. Perihilar vascular congestion and mild-to-moderate interstitial edema continue to be seen slightly worsened in the interval. There is improvement in aeration in the left lower lung field. No pneumothorax is seen post thoracentesis. Small right and minimal left pleural effusions are noted with increased interstitial  haziness in the right base which could be increasing atelectasis or consolidation. There is no change in aeration of the upper zones which show faint patchy ground-glass opacities consistent with ground-glass edema or pneumonitis. The mediastinum is stable. IMPRESSION: 1. No post thoracentesis pneumothorax is seen. 2. Perihilar vascular congestion and mild-to-moderate interstitial edema slightly worsened. 3. Improved left lower zonal aeration with only minimal pleural effusion with small right pleural effusion again noted. 4. Increased right lower zonal haziness consistent with increased atelectasis or consolidation. 5. Stable patchy haziness in the upper zones. Electronically Signed   By: Telford Nab M.D.   On: 01/09/2022 05:44   DG CHEST PORT 1 VIEW  Result Date: 01/08/2022 CLINICAL DATA:  Status post thoracentesis EXAM: PORTABLE CHEST 1 VIEW COMPARISON:  Previous studies including the examination of 01/06/2022 FINDINGS: There is improvement in aeration of right hemithorax, possibly due to decrease in pleural effusion. Transverse diameter of heart is increased. Central pulmonary vessels are prominent. Increased interstitial and alveolar markings are seen in the parahilar regions and lower lung fields, more so on the left side. There is blunting of both lateral CP angles. There is no pneumothorax. Tip of dialysis catheter is seen in the superior vena cava close to the right atrium. Enteric tube is noted traversing the esophagus. IMPRESSION: There is improvement in aeration of right hemithorax suggesting decrease in right pleural effusion. Cardiomegaly. Central pulmonary vessels are prominent with increased interstitial and alveolar markings suggesting CHF. Bilateral pleural effusions, more so on the left side. There is no pneumothorax. Electronically Signed   By: Elmer Picker M.D.   On: 01/08/2022 17:39    Labs: BMET Recent Labs  Lab 01/07/22 0421 01/07/22 1656 01/08/22 0331 01/08/22 1611  01/09/22 0303 01/09/22 1535 01/10/22 0023  NA 139 139 137 135 134*  134* 137 134*  135  K 3.9 4.5 4.5 4.8 4.5  4.5 4.6 4.5  4.6  CL 103 104 104 103 104  105 105 105  106  CO2 27 26 24 25 25  24 26 25  26   GLUCOSE 120* 155* 171* 204* 93  93 194* 153*  153*  BUN 39* 36* 31* 29* 27*  27* 26* 27*  27*  CREATININE 2.48* 2.33* 2.01* 1.84* 1.82*  1.75* 1.74* 1.65*  1.71*  CALCIUM 9.6 9.7 9.6 9.4 9.4  9.4 9.9 9.7  9.7  PHOS 3.8 4.0 3.6 3.3 2.9  2.9 3.2 3.1   CBC Recent Labs  Lab 01/07/22 0421 01/08/22 0331 01/09/22 0303 01/10/22 0023  WBC 10.1 8.3 12.0* 11.9*  HGB 10.0* 9.9* 9.2* 8.7*  HCT 31.5* 31.5* 29.1* 27.4*  MCV 83.3 84.5 84.1 86.2  PLT 105* 73* 70* 63*    Medications:     vitamin C  250 mg Per Tube BID   B-complex with vitamin C  1 tablet Per Tube Daily   Chlorhexidine Gluconate Cloth  6 each Topical Daily   feeding supplement  237 mL Oral TID BM   feeding supplement (PIVOT 1.5 CAL)  1,000 mL Per Tube [id number]   folic acid  1 mg Per Tube Daily   insulin aspart  0-20  Units Subcutaneous Q4H   insulin glargine-yfgn  26 Units Subcutaneous BID   metoprolol tartrate  25 mg Per Tube BID   pantoprazole  40 mg Intravenous Q12H   rifaximin  550 mg Per Tube BID      Paulene Floor, MD 01/10/2022, 8:55 AM

## 2022-01-11 ENCOUNTER — Ambulatory Visit (INDEPENDENT_AMBULATORY_CARE_PROVIDER_SITE_OTHER): Payer: PRIVATE HEALTH INSURANCE | Admitting: Gastroenterology

## 2022-01-11 ENCOUNTER — Ambulatory Visit: Payer: PRIVATE HEALTH INSURANCE | Admitting: Cardiology

## 2022-01-11 DIAGNOSIS — K7460 Unspecified cirrhosis of liver: Secondary | ICD-10-CM | POA: Diagnosis not present

## 2022-01-11 DIAGNOSIS — N179 Acute kidney failure, unspecified: Secondary | ICD-10-CM

## 2022-01-11 DIAGNOSIS — Z66 Do not resuscitate: Secondary | ICD-10-CM | POA: Diagnosis not present

## 2022-01-11 DIAGNOSIS — Z515 Encounter for palliative care: Secondary | ICD-10-CM | POA: Diagnosis not present

## 2022-01-11 DIAGNOSIS — I8500 Esophageal varices without bleeding: Secondary | ICD-10-CM | POA: Diagnosis not present

## 2022-01-11 LAB — GLUCOSE, CAPILLARY
Glucose-Capillary: 119 mg/dL — ABNORMAL HIGH (ref 70–99)
Glucose-Capillary: 137 mg/dL — ABNORMAL HIGH (ref 70–99)
Glucose-Capillary: 152 mg/dL — ABNORMAL HIGH (ref 70–99)
Glucose-Capillary: 164 mg/dL — ABNORMAL HIGH (ref 70–99)
Glucose-Capillary: 168 mg/dL — ABNORMAL HIGH (ref 70–99)
Glucose-Capillary: 178 mg/dL — ABNORMAL HIGH (ref 70–99)

## 2022-01-11 LAB — CBC
HCT: 23.1 % — ABNORMAL LOW (ref 36.0–46.0)
Hemoglobin: 7.5 g/dL — ABNORMAL LOW (ref 12.0–15.0)
MCH: 27.9 pg (ref 26.0–34.0)
MCHC: 32.5 g/dL (ref 30.0–36.0)
MCV: 85.9 fL (ref 80.0–100.0)
Platelets: 56 10*3/uL — ABNORMAL LOW (ref 150–400)
RBC: 2.69 MIL/uL — ABNORMAL LOW (ref 3.87–5.11)
RDW: 27.6 % — ABNORMAL HIGH (ref 11.5–15.5)
WBC: 10.4 10*3/uL (ref 4.0–10.5)
nRBC: 0 % (ref 0.0–0.2)

## 2022-01-11 LAB — RENAL FUNCTION PANEL
Albumin: 2.5 g/dL — ABNORMAL LOW (ref 3.5–5.0)
Anion gap: 5 (ref 5–15)
BUN: 51 mg/dL — ABNORMAL HIGH (ref 8–23)
CO2: 27 mmol/L (ref 22–32)
Calcium: 10.2 mg/dL (ref 8.9–10.3)
Chloride: 104 mmol/L (ref 98–111)
Creatinine, Ser: 2.61 mg/dL — ABNORMAL HIGH (ref 0.44–1.00)
GFR, Estimated: 20 mL/min — ABNORMAL LOW (ref >60)
Glucose, Bld: 158 mg/dL — ABNORMAL HIGH (ref 70–99)
Phosphorus: 4.3 mg/dL (ref 2.5–4.6)
Potassium: 4.8 mmol/L (ref 3.5–5.1)
Sodium: 136 mmol/L (ref 135–145)

## 2022-01-11 LAB — CYTOLOGY - NON PAP

## 2022-01-11 LAB — MAGNESIUM: Magnesium: 2.8 mg/dL — ABNORMAL HIGH (ref 1.7–2.4)

## 2022-01-11 MED ORDER — LACTULOSE 10 GM/15ML PO SOLN
10.0000 g | Freq: Two times a day (BID) | ORAL | Status: DC
  Administered 2022-01-11 (×2): 10 g
  Filled 2022-01-11 (×2): qty 15

## 2022-01-11 MED ORDER — SODIUM CHLORIDE 0.9% FLUSH
10.0000 mL | Freq: Two times a day (BID) | INTRAVENOUS | Status: DC
  Administered 2022-01-11 – 2022-01-14 (×8): 10 mL

## 2022-01-11 MED ORDER — SODIUM CHLORIDE 0.9% FLUSH: 10.0000 mL | INTRAVENOUS | Status: DC | PRN

## 2022-01-11 NOTE — Progress Notes (Signed)
NAME:  [name], MRN:  [id number], DOB:  10-Jul-1956, LOS: 34 ADMISSION DATE:  01/06/2022, CONSULTATION DATE:  6/6 REFERRING MD:  [name], CHIEF COMPLAINT:  Hematemesis   History of Present Illness:  66 y/o female with cirrhosis from NASH who had a recent esophageal variceal bleed requiring intubation and eventual TIPS.  After TIPS she has had a prolonged ICU stay for respiratory failure and renal failure.  Pertinent  Medical History  Fe Def Anemia DM2 Atrial fibrillation NASH cirrhosis COPD CHF Hyperlipidemia OSA CVA Hypertension  Significant Hospital Events: Including procedures, antibiotic start and stop dates in addition to other pertinent events   6/06 EGD with varix banding [facility] intubated, start octreotide/protonix gtt, transfer to [facility] for TIPS 6/7 extubated 6/8 started on cardizem for HTN and A.fib RVR 6/12 renal failure developing anuria. Nephrology consulted. ? HRS. Octreotide started.  6/14 poor UOP continued and dyspnea worsened. HD started 6/15 starting CRRT, unable to come off BiPAP.  6/16 R thoracentesis> transudate, 1300 cc removed; vomited and aspirated 6/18 off BIPAP, CRRT held, planning family conversation  Interim History / Subjective:   Off bipap More somnolent/drowsy today  Objective   Blood pressure 114/61, pulse 79, temperature 98.8 F (37.1 C), temperature source Axillary, resp. rate 14, height _0  (1.702 m), weight 103.2 kg, SpO2 100 %.    FiO2 (%):  [40 %] 40 %   Intake/Output Summary (Last 24 hours) at 01/11/2022 0841 Last data filed at 01/11/2022 0654 Gross per 24 hour  Intake 1280 ml  Output 305 ml  Net 975 ml   Filed Weights   01/05/22 0500 01/06/22 0500 01/10/22 0500  Weight: 118.7 kg 118.9 kg 103.2 kg    Examination:  General:  Resting comfortably in bed HENT: NCAT OP clear PULM: CTA B, normal effort CV: RRR, no mgr GI: BS+, soft, nontender MSK: normal bulk and tone Neuro: drwosy, stirs to touch, no distress,  MAEW     Resolved Hospital Problem list   Hypertensive urgency  Assessment & Plan:  Acute upper GI bleeding from GI bleeding from esophageal varices in setting of NASH cirrhosis requiring TIPS Acute hepatic encephalopathy > worse 6/17 Continue PPI Monitor for bleeding Continue rifaximin Schedule lactulose K > 4  Acute hypoxemic respiratory failure due to volume overload> little progress this week despite CRRT and thoracentesis Aspiration pneumonia, completed antibiotics 6/14 Aspirated 6/16 but doesn't have pneumonia 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 OSA BIPAP qHS Aspiration precautions No role for further thoracentesis  AKI presumed contrast induced nephropathy; CRRT held on 6/18 Not a dialysis candidate Monitor BMET and UOP Replace electrolytes as needed  Paroxysmal atrial fibrillation with RVR Hypertension Chronic HFpER Tele metoprolol  Fe def Anemia Monitor for bleeding Transfuse PRBC for Hgb < 7 gm/dL   DM2 with hyperglycemia SSI, glargine at current dose  Dysphagia Continue tube feeding for now and meals by mouth Aspiration precautions  Goals of care: have met with both sisters Palliative care consulted, see iPal note from 6/18  Best Practice (right click and "Reselect all SmartList Selections" daily)   Diet/type: tubefeeds DVT prophylaxis: SCD GI prophylaxis: PPI Lines: Dialysis Catheter and yes and it is still needed Foley:  Yes, and it is still needed Code Status:  DNR Last date of multidisciplinary goals of care discussion [6/12, planning to discuss again on 6/18]     Critical care time: 32 minutes     Roselie Awkward, MD [facility] Pager: [phone] Cell: [phone] After 7:00 pm call Elink  (  [phone]
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
## 2022-01-11 NOTE — Progress Notes (Signed)
[facility] Progress Note    Assessment/ Plan:   Acute Kidney Injury - likely secondary to contrast induced nephropathy as renal function started worsening after the needed IV contrast administration. But part of the differential is MPGN and HRS type 1. She is already being treated with Midodrine and initially Octreotide (stopped 6/14).  Urine microscopy evaluated on 6/13 -> WBC, normomorphic RBC's, budding yeast, no RBC/ WBC casts or dysmorphic RBC's. 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 Renal function was normal prior to and then steadily worsened post contrast. Significant contrast was required for the TIPS procedure.  - UOP is better but really poor response to high dose diuretics -> HD on 6/15 (came off @0400 ) and able to get 3L off but still significantly overloaded and then started on CRRT 6/15-6/18.  - not a candidate for further dialysis - 300 mL UOP--> monitor - greatly appreciate palliative care.    -Maintain MAP>65 for optimal renal perfusion.  - Avoid nephrotoxic medications including NSAIDs and iodinated intravenous contrast exposure unless the latter is absolutely indicated.   - Preferred narcotic agents for pain control are hydromorphone, fentanyl, and methadone. Morphine should not be used.  - Avoid Baclofen and avoid oral sodium phosphate and magnesium citrate based laxatives / bowel preps.  - Continue strict Input and Output monitoring. Will monitor the patient closely with you and intervene or adjust therapy as indicated by changes in clinical status/labs   Iron deficiency anemia with h/o UGI bleed.  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  Subjective:    Seen in room.  Appears comfortable.  Sister at bedside.  Discussed prognosis.  Discussed risks of further dialysis in the setting of  advanced liver disease and her sister affirms no further dialysis.   Objective:   BP (!) 116/52   Pulse 80   Temp 98.5 F (36.9 C) (Oral)   Resp 14   Ht 5\' 7"  (1.702 m)   Wt 103.2 kg   SpO2 100%   BMI 35.63 kg/m   Intake/Output Summary (Last 24 hours) at 01/11/2022 1209 Last data filed at 01/11/2022 1200 Gross per 24 hour  Intake 1950 ml  Output 385 ml  Net 1565 ml   Weight change:   Physical Exam: 1210, does not wake to light touch or voice today CVS: RRR Resp: even and unlabored, clear anteriorly Abd: soft Ext: trace LE edema  Imaging: No results found.  Labs: BMET Recent Labs  Lab 01/08/22 0331 01/08/22 1611 01/09/22 0303 01/09/22 1535 01/10/22 0023 01/10/22 1649 01/11/22 0305  NA 137 135 134*  134* 137 134*  135 133* 136  K 4.5 4.8 4.5  4.5 4.6 4.5  4.6 4.7 4.8  CL 104 103 104  105 105 105  106 101 104  CO2 24 25 25  24 26 25  26 27 27   GLUCOSE 171* 204* 93  93 194* 153*  153* 154* 158*  BUN 31* 29* 27*  27* 26* 27*  27* 39* 51*  CREATININE 2.01* 1.84* 1.82*  1.75* 1.74* 1.65*  1.71* 2.16* 2.61*  CALCIUM 9.6 9.4 9.4  9.4 9.9 9.7  9.7 10.0 10.2  PHOS 3.6 3.3 2.9  2.9 3.2 3.1 3.6 4.3   CBC Recent Labs  Lab 01/08/22 0331 01/09/22 0303 01/10/22 0023 01/11/22 0305  WBC 8.3 12.0* 11.9*  10.4  HGB 9.9* 9.2* 8.7* 7.5*  HCT 31.5* 29.1* 27.4* 23.1*  MCV 84.5 84.1 86.2 85.9  PLT 73* 70* 63* 56*    Medications:     vitamin C  250 mg Per Tube BID   B-complex with vitamin C  1 tablet Per Tube Daily   Chlorhexidine Gluconate Cloth  6 each Topical Daily   feeding supplement  237 mL Oral TID BM   feeding supplement (PIVOT 1.5 CAL)  1,000 mL Per Tube Q24H   folic acid  1 mg Per Tube Daily   insulin aspart  0-20 Units Subcutaneous Q4H   insulin glargine-yfgn  26 Units Subcutaneous BID   lactulose  10 g Per Tube BID   metoprolol tartrate  25 mg Per Tube BID   pantoprazole sodium  40 mg Per Tube BID   rifaximin  550 mg Per Tube BID    sodium chloride flush  10-40 mL Intracatheter Q12H      Bufford Buttner MD 01/11/2022, 12:09 PM

## 2022-01-12 DIAGNOSIS — R531 Weakness: Secondary | ICD-10-CM | POA: Diagnosis not present

## 2022-01-12 DIAGNOSIS — Z515 Encounter for palliative care: Secondary | ICD-10-CM

## 2022-01-12 DIAGNOSIS — N179 Acute kidney failure, unspecified: Secondary | ICD-10-CM | POA: Diagnosis not present

## 2022-01-12 DIAGNOSIS — Z66 Do not resuscitate: Secondary | ICD-10-CM | POA: Diagnosis present

## 2022-01-12 DIAGNOSIS — K922 Gastrointestinal hemorrhage, unspecified: Secondary | ICD-10-CM | POA: Diagnosis not present

## 2022-01-12 DIAGNOSIS — I8500 Esophageal varices without bleeding: Secondary | ICD-10-CM | POA: Diagnosis not present

## 2022-01-12 DIAGNOSIS — J9601 Acute respiratory failure with hypoxia: Secondary | ICD-10-CM | POA: Diagnosis not present

## 2022-01-12 LAB — MAGNESIUM: Magnesium: 3.1 mg/dL — ABNORMAL HIGH (ref 1.7–2.4)

## 2022-01-12 LAB — CBC
HCT: 23.2 % — ABNORMAL LOW (ref 36.0–46.0)
Hemoglobin: 7.4 g/dL — ABNORMAL LOW (ref 12.0–15.0)
MCH: 27.6 pg (ref 26.0–34.0)
MCHC: 31.9 g/dL (ref 30.0–36.0)
MCV: 86.6 fL (ref 80.0–100.0)
Platelets: 58 10*3/uL — ABNORMAL LOW (ref 150–400)
RBC: 2.68 MIL/uL — ABNORMAL LOW (ref 3.87–5.11)
RDW: 27.9 % — ABNORMAL HIGH (ref 11.5–15.5)
WBC: 9.4 10*3/uL (ref 4.0–10.5)
nRBC: 0 % (ref 0.0–0.2)

## 2022-01-12 LAB — BODY FLUID CULTURE W GRAM STAIN
Culture: NO GROWTH
Gram Stain: NONE SEEN

## 2022-01-12 LAB — PHOSPHORUS: Phosphorus: 5.4 mg/dL — ABNORMAL HIGH (ref 2.5–4.6)

## 2022-01-12 LAB — BASIC METABOLIC PANEL
Anion gap: 7 (ref 5–15)
BUN: 74 mg/dL — ABNORMAL HIGH (ref 8–23)
CO2: 26 mmol/L (ref 22–32)
Calcium: 10.5 mg/dL — ABNORMAL HIGH (ref 8.9–10.3)
Chloride: 104 mmol/L (ref 98–111)
Creatinine, Ser: 3.22 mg/dL — ABNORMAL HIGH (ref 0.44–1.00)
GFR, Estimated: 15 mL/min — ABNORMAL LOW (ref >60)
Glucose, Bld: 164 mg/dL — ABNORMAL HIGH (ref 70–99)
Potassium: 4.9 mmol/L (ref 3.5–5.1)
Sodium: 137 mmol/L (ref 135–145)

## 2022-01-12 LAB — GLUCOSE, CAPILLARY
Glucose-Capillary: 140 mg/dL — ABNORMAL HIGH (ref 70–99)
Glucose-Capillary: 145 mg/dL — ABNORMAL HIGH (ref 70–99)
Glucose-Capillary: 151 mg/dL — ABNORMAL HIGH (ref 70–99)
Glucose-Capillary: 154 mg/dL — ABNORMAL HIGH (ref 70–99)
Glucose-Capillary: 156 mg/dL — ABNORMAL HIGH (ref 70–99)
Glucose-Capillary: 175 mg/dL — ABNORMAL HIGH (ref 70–99)

## 2022-01-12 LAB — AMMONIA: Ammonia: 88 umol/L — ABNORMAL HIGH (ref 9–35)

## 2022-01-12 MED ORDER — LACTULOSE 10 GM/15ML PO SOLN
10.0000 g | Freq: Three times a day (TID) | ORAL | Status: DC
  Administered 2022-01-12 – 2022-01-14 (×7): 10 g
  Filled 2022-01-12 (×7): qty 15

## 2022-01-12 NOTE — Progress Notes (Signed)
Speech Language Pathology Treatment: Dysphagia  Patient Details Name: Chelsea [name] MRN: [id number] DOB: 07/22/1956 Today's Date: 01/12/2022 Time: 6579-0383 SLP Time Calculation (min) (ACUTE ONLY): 15 min  Assessment / Plan / Recommendation Clinical Impression  Patient seen by SLP for skilled treatment session focused on dysphagia goals. Her sister who has stayed with patient and assisted with feeding was present in the room. When SLP asked about patient's toleration of Dys 2 solids, sister felt like she was having difficulty masticating them and in addition, more dense foods like mashed potatoes. SLP and sister in agreement to downgrade diet slightly to Dys 1 solids and will continue to follow for toleration. SLP observed patient with bites of smooth purees (magic cup) and thin liquids (juice). She did not exhibit any overt s/s aspiration or penetration. Overall she is more alert and attentive, speech is significantly more intelligible and speech rate has improved (was very slow initially). SLP will continue to follow for dysphagia goals.   HPI HPI: Patient is a 66 y.o. 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 She had recent admisison 5/24 to 12/21/21 at [facility] for upper GI bleeding and had EGD on 6/6 at [facility] for grade 3 varices but had difficulty with bleeding and required intubation for airway protection during the procedure and transferred to [facility] for IR to assess TIPS.      SLP Plan         Recommendations for follow up therapy are one component of a multi-disciplinary discharge planning process, led by the attending physician.  Recommendations may be updated based on patient status, additional functional criteria and insurance authorization.    Recommendations  Diet recommendations: Dysphagia 1 (puree);Thin liquid Liquids provided via: Cup;Straw Medication Administration: Whole meds with puree Supervision: Full supervision/cueing for compensatory  strategies;Staff to assist with self feeding Compensations: Slow rate;Small sips/bites Postural Changes and/or Swallow Maneuvers: Seated upright 90 degrees                          Chelsea Nevin, MA, CCC-SLP Speech Therapy

## 2022-01-12 NOTE — Progress Notes (Signed)
[facility] Progress Note    Assessment/ Plan:   Acute Kidney Injury - likely secondary to contrast induced nephropathy as renal function started worsening after the needed IV contrast administration. But part of the differential is MPGN and HRS type 1. She is already being treated with Midodrine and initially Octreotide (stopped 6/14).  Urine microscopy evaluated on 6/13 -> WBC, normomorphic RBC's, budding yeast, no RBC/ WBC casts or dysmorphic RBC's. 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 Renal function was normal prior to and then steadily worsened post contrast. Significant contrast was required for the TIPS procedure.  - UOP is better but really poor response to high dose diuretics -> HD on 6/15 (came off @0400 ) and able to get 3L off but still significantly overloaded and then started on CRRT 6/15-6/18.  - not a candidate for further dialysis- discussed risks of more dialysis with pt's sister in the setting of her advanced liver disease- sister affirmed no further dialysis 01/11/22 - UOP slightly improving but Cr continues to rise indicating that renal recovery has not taken place yet - greatly appreciate palliative care- family meeting tomorrow 6/21    -Maintain MAP>65 for optimal renal perfusion.  - Avoid nephrotoxic medications including NSAIDs and iodinated intravenous contrast exposure unless the latter is absolutely indicated.   - Preferred narcotic agents for pain control are hydromorphone, fentanyl, and methadone. Morphine should not be used.  - Avoid Baclofen and avoid oral sodium phosphate and magnesium citrate based laxatives / bowel preps.  - Continue strict Input and Output monitoring. Will monitor the patient closely with you and intervene or adjust therapy as indicated by changes in clinical status/labs   Iron deficiency anemia with h/o UGI bleed.  DM on Lantus and ISS Afib rate controlled, holding on  anticoagulation bec of h/o UGIB Cirrhosis secondary to NASH s/p banding of varices and TIPS 6/6.  Subjective:    Seen in room.  Appears comfortable- just bathed.  UOP slightly improving but Cr still uptrending.     Objective:   BP (!) 123/57   Pulse 85   Temp 99.1 F (37.3 C) (Oral)   Resp (!) 21   Ht 5\' 7"  (1.702 m)   Wt 103.2 kg   SpO2 100%   BMI 35.63 kg/m   Intake/Output Summary (Last 24 hours) at 01/12/2022 1158 Last data filed at 01/12/2022 1000 Gross per 24 hour  Intake 1390 ml  Output 651 ml  Net 739 ml   Weight change:   Physical Exam: 01/14/2022, stirs briefly to voice  CVS: RRR Resp: even and unlabored, clear anteriorly Abd: soft Ext: trace LE edema  Imaging: No results found.  Labs: BMET Recent Labs  Lab 01/08/22 1611 01/09/22 0303 01/09/22 1535 01/10/22 0023 01/10/22 1649 01/11/22 0305 01/12/22 0359  NA 135 134*  134* 137 134*  135 133* 136 137  K 4.8 4.5  4.5 4.6 4.5  4.6 4.7 4.8 4.9  CL 103 104  105 105 105  106 101 104 104  CO2 25 25  24 26 25  26 27 27 26   GLUCOSE 204* 93  93 194* 153*  153* 154* 158* 164*  BUN 29* 27*  27* 26* 27*  27* 39* 51* 74*  CREATININE 1.84* 1.82*  1.75* 1.74* 1.65*  1.71* 2.16* 2.61* 3.22*  CALCIUM 9.4 9.4  9.4 9.9 9.7  9.7 10.0 10.2 10.5*  PHOS 3.3 2.9  2.9 3.2 3.1 3.6 4.3  5.4*   CBC Recent Labs  Lab 01/09/22 0303 01/10/22 0023 01/11/22 0305 01/12/22 0359  WBC 12.0* 11.9* 10.4 9.4  HGB 9.2* 8.7* 7.5* 7.4*  HCT 29.1* 27.4* 23.1* 23.2*  MCV 84.1 86.2 85.9 86.6  PLT 70* 63* 56* 58*    Medications:     vitamin C  250 mg Per Tube BID   B-complex with vitamin C  1 tablet Per Tube Daily   Chlorhexidine Gluconate Cloth  6 each Topical Daily   feeding supplement  237 mL Oral TID BM   feeding supplement (PIVOT 1.5 CAL)  1,000 mL Per Tube Q24H   folic acid  1 mg Per Tube Daily   insulin aspart  0-20 Units Subcutaneous Q4H   insulin glargine-yfgn  26 Units Subcutaneous BID   lactulose   10 g Per Tube TID   metoprolol tartrate  25 mg Per Tube BID   pantoprazole sodium  40 mg Per Tube BID   rifaximin  550 mg Per Tube BID   sodium chloride flush  10-40 mL Intracatheter Q12H      Bufford Buttner MD 01/12/2022, 11:58 AM

## 2022-01-12 NOTE — TOC Progression Note (Signed)
Transition of Care [facility]) - Initial/Assessment Note    Patient Details  Name: Chelsea [name] MRN: [id number] Date of Birth: [birth year]  Transition of Care [facility]) CM/SW Contact:    Ralene Bathe, LCSWA Phone Number: 01/12/2022, 12:08 PM  Clinical Narrative:                  Transition of Care Department [facility]) has reviewed patient.  The plan is for the family to meet for a GOC meeting tomorrow.    We will continue to monitor patient advancement through interdisciplinary progression rounds.    Expected Discharge Plan: Skilled Nursing Facility Barriers to Discharge: Continued Medical Work up, English as a second language teacher, SNF Pending bed offer   Patient Goals and CMS Choice Patient states their goals for this hospitalization and ongoing recovery are:: To return home CMS Medicare.gov Compare Post Acute Care list provided to:: Patient Represenative (must comment) Choice offered to / list presented to : Sibling  Expected Discharge Plan and Services Expected Discharge Plan: Skilled Nursing Facility   Discharge Planning Services: CM Consult   Living arrangements for the past 2 months: Single Family Home                                      Prior Living Arrangements/Services Living arrangements for the past 2 months: Single Family Home Lives with:: Self Patient language and need for interpreter reviewed:: Yes Do you feel safe going back to the place where you live?: Yes      Need for Family Participation in Patient Care: Yes (Comment) Care giver support system in place?: Yes (comment)   Criminal Activity/Legal Involvement Pertinent to Current Situation/Hospitalization: No - Comment as needed  Activities of Daily Living      Permission Sought/Granted Permission sought to share information with : Case Manager, Family Supports Permission granted to share information with : Yes, Verbal Permission Granted              Emotional Assessment Appearance:: Appears stated  age Attitude/Demeanor/Rapport: Engaged, Gracious Affect (typically observed): Accepting, Appropriate, Calm, Hopeful Orientation: : Oriented to Self Alcohol / Substance Use: Not Applicable Psych Involvement: No (comment)  Admission diagnosis:  Varices of esophagus determined by endoscopy (HCC) [I85.00] Acute upper GI bleed [K92.2] Patient Active Problem List   Diagnosis Date Noted   DNR (do not resuscitate)    Palliative care by specialist    AKI (acute kidney injury) (HCC)    Pressure injury of skin 01/06/2022   Encephalopathy, hepatic (HCC)    Hypomagnesemia    Acute respiratory failure with hypoxia (HCC)    Encephalopathy acute    Recurrent right pleural effusion    Varices of esophagus determined by endoscopy (HCC) 12/31/2021   Acute upper GI bleed 01/22/2022   Supratherapeutic INR    Acute blood loss anemia 12/16/2021   Lower extremity edema 11/30/2021   Elevated LFTs 11/30/2021   Medical non-compliance 06/01/2021   Liver cirrhosis secondary to NASH (HCC) 08/14/2020   Esophageal varices (HCC) 08/14/2020   Esophageal varices in cirrhosis (HCC) 05/05/2020   Nausea without vomiting 05/05/2020   Sleep apnea 09/10/2011   S/P subdural hematoma evacuation 11/12/2010   Chronic anticoagulation 11/12/2010   Atrial fibrillation (HCC) 09/04/2009   FINGER PAIN 09/04/2009   Diabetes mellitus (HCC) 08/07/2009   Essential hypertension, benign 08/07/2009   PALPITATIONS 08/07/2009   CHEST PAIN, PRECORDIAL 08/07/2009   PCP:  Richardean Chimera,  MD Pharmacy:   [facility] [phone] [place], [place] - [address] [place] [address] Phone: [phone] Fax: [phone]     Social Determinants of Health (SDOH) Interventions    Readmission Risk Interventions     No data to display

## 2022-01-12 NOTE — Progress Notes (Signed)
Patient ID: [name], female   DOB: Jul 13, 1956, 66 y.o.   MRN: [id number]    Progress Note from the Palliative Medicine Team at [facility]   Patient Name: Chelsea [name]        Date: 01/12/2022 DOB: Sep 03, 1955  Age: 33 y.o. MRN#: [id number] Attending Physician: Leslye Peer, MD Primary Care Physician: Richardean Chimera, MD Admit Date: 01/12/2022   Medical records reviewed    66 y.o. female  admitted on 12/31/2021 with cirrhosis from NASH who had a recent esophageal variceal bleed requiring intubation and eventual TIPS.  After TIPS she has had a prolonged ICU stay for respiratory failure and renal failure.   Significant    6/06 EGD with varix banding at [facility], intubated, start octreotide/protonix gtt, transfer to [facility] for TIPS 6/7 extubated 6/8 started on cardizem for HTN and A.fib RVR 6/12 renal failure developing anuria. Nephrology consulted. ? HRS. Octreotide started.  6/14 poor UOP continued and dyspnea worsened. HD started 6/15 starting CRRT, unable to come off BiPAP.  6/16 R thoracentesis> transudate, 1300 cc removed; vomited and aspirated 6/18 off BIPAP, CRRT held, planning family conversation.   01/11/22 Not a candidate for further dialysis, discussed with family  01/12/22   Creatinine 3.22 today    Patient and family face treatment option decisions, advanced directive decisions    This NP visited patient at the bedside as a follow up to  yesterday's GOCs meeting, sister/[name]  at bedside  Patient is alert and oriented and engaged in conversation today.  Currently she is open to all offered and available medical interventions to prolong life and is hopeful for improvement.  Patient is open to and aware of scheduled meeting for tomorrow/Wednesday  at 230 with her two sisters to discuss treatment option decisions, advanced directive decisions and anticipatory care needs.  Education offered today regarding  the importance of continued conversation with family and their   medical providers regarding overall plan of care and treatment options,  ensuring decisions are within the context of the patients values and GOCs.  Questions and concerns addressed   Lorinda Creed NP  Palliative Medicine Team Team Phone # [phone] Pager [phone]

## 2022-01-12 NOTE — Progress Notes (Signed)
Physical Therapy Treatment Patient Details Name: Chelsea [name] MRN: [id number] DOB: 17-Feb-1956 Today's Date: 01/12/2022   History of Present Illness 66 yo female who presented to [facility] for EGD of esophageal varices 6/6 with difficulty breathing, intubation and transferred to [facility] with TIPS. Extubated 6/7. 6/18  CRRT stopped.PMhx:Iron deficiency anemia, DM type 2, A fib, NASH with cirrhosis, COPD, CHF, HLD, OSA, CVA, HTN    PT Comments    Pt agreeable to getting up to EOB to work on balance and transitions.  Emphasis on U and LE ROM and strengthening exercise, transitions to EOB, work on upright sitting balance, dynamic and static.  Pt ended up laying back in bed before therapist exited room.    Recommendations for follow up therapy are one component of a multi-disciplinary discharge planning process, led by the attending physician.  Recommendations may be updated based on patient status, additional functional criteria and insurance authorization.  Follow Up Recommendations  Skilled nursing-short term rehab (<3 hours/day)     Assistance Recommended at Discharge    Patient can return home with the following A lot of help with walking and/or transfers;A lot of help with bathing/dressing/bathroom;Assistance with cooking/housework;Direct supervision/assist for financial management;Assist for transportation;Help with stairs or ramp for entrance;Direct supervision/assist for medications management   Equipment Recommendations  Other (comment) (TBA furthr)    Recommendations for Other Services       Precautions / Restrictions Precautions Precaution Comments: flexiseal, cortrak     Mobility  Bed Mobility Overal bed mobility: Needs Assistance Bed Mobility: Supine to Sit, Sit to Supine     Supine to sit: Mod assist Sit to supine: Max assist   General bed mobility comments: cues for direction, assist of LE's toward hooklying and off the bed, truncal assist forward and for stability  until pt positioned R UE to assist more.    Transfers                   General transfer comment: not tested today.    Ambulation/Gait                   Stairs             Wheelchair Mobility    Modified Rankin (Stroke Patients Only)       Balance Overall balance assessment: Needs assistance Sitting-balance support: Bilateral upper extremity supported, Feet supported Sitting balance-Leahy Scale: Poor Sitting balance - Comments: worked on coming L to R into midline, sitting into "attension", going down into and out of sidelying back to sitting, stability while reaching across body bil and LAQ bil >=5 reps.                                    Cognition Arousal/Alertness: Awake/alert Behavior During Therapy: WFL for tasks assessed/performed Overall Cognitive Status: No family/caregiver present to determine baseline cognitive functioning                                          Exercises General Exercises - Upper Extremity Shoulder Flexion: AROM, Both, 5 reps, Supine Elbow Flexion: AROM, Both, 5 reps, Supine Elbow Extension: AROM, Right, 5 reps, Supine Other Exercises Other Exercises: bil LE completed 10 reps each of hip/knee flex/ext with graded resistance    General Comments General comments (skin integrity, edema, etc.):  vss overall      Pertinent Vitals/Pain Pain Assessment Pain Assessment: Faces Faces Pain Scale: No hurt Pain Intervention(s): Monitored during session    Home Living                          Prior Function            PT Goals (current goals can now be found in the care plan section) Acute Rehab PT Goals Patient Stated Goal: return home and read PT Goal Formulation: With patient Time For Goal Achievement: 01/26/2022 Potential to Achieve Goals: Fair Progress towards PT goals: Progressing toward goals    Frequency    Min 3X/week      PT Plan Current plan remains  appropriate    Co-evaluation              AM-PAC PT "6 Clicks" Mobility   Outcome Measure  Help needed turning from your back to your side while in a flat bed without using bedrails?: A Little Help needed moving from lying on your back to sitting on the side of a flat bed without using bedrails?: A Lot Help needed moving to and from a bed to a chair (including a wheelchair)?: Total Help needed standing up from a chair using your arms (e.g., wheelchair or bedside chair)?: A Lot Help needed to walk in hospital room?: Total Help needed climbing 3-5 steps with a railing? : Total 6 Click Score: 10    End of Session   Activity Tolerance: Patient tolerated treatment well     PT Visit Diagnosis: Other abnormalities of gait and mobility (R26.89);Difficulty in walking, not elsewhere classified (R26.2)     Time: 1410-1433 PT Time Calculation (min) (ACUTE ONLY): 23 min  Charges:  $Therapeutic Exercise: 8-22 mins $Therapeutic Activity: 8-22 mins                     01/12/2022  Jacinto Halim., PT Acute Rehabilitation Services [phone]  (pager) [phone]  (office)   [name] Izmael Duross 01/12/2022, 3:59 PM

## 2022-01-12 NOTE — Progress Notes (Signed)
NAME:  Chelsea [name], MRN:  [id number], DOB:  [birth year], LOS: 49 ADMISSION DATE:  01/06/2022, CONSULTATION DATE:  6/6 REFERRING MD:  [name], CHIEF COMPLAINT:  Hematemesis   History of Present Illness:  66 y/o female with cirrhosis from NASH who had a recent esophageal variceal bleed requiring intubation and eventual TIPS.  After TIPS she has had a prolonged ICU stay for respiratory failure and renal failure.  Pertinent  Medical History  Fe Def Anemia DM2 Atrial fibrillation NASH cirrhosis COPD CHF Hyperlipidemia OSA CVA Hypertension  Significant Hospital Events: Including procedures, antibiotic start and stop dates in addition to other pertinent events   6/06 EGD with varix banding at [facility], intubated, start octreotide/protonix gtt, transfer to [facility] for TIPS 6/7 extubated 6/8 started on cardizem for HTN and A.fib RVR 6/12 renal failure developing anuria. Nephrology consulted. ? HRS. Octreotide started.  6/14 poor UOP continued and dyspnea worsened. HD started 6/15 starting CRRT, unable to come off BiPAP.  6/16 R thoracentesis> transudate, 1300 cc removed; vomited and aspirated 6/18 off BIPAP, CRRT held, planning family conversation  Interim History / Subjective:  Wore BiPAP for 2 hours overnight, then back to 2 L/min I/O -7.5 L total, urine output last 24 hours 555 cc S Cr 2.61 > 3.22 Plt 58, Hgb 7.4   Objective   Blood pressure 127/69, pulse 84, temperature 99.1 F (37.3 C), temperature source Oral, resp. rate 19, height _0  (1.702 m), weight 103.2 kg, SpO2 100 %.    FiO2 (%):  [40 %] 40 %   Intake/Output Summary (Last 24 hours) at 01/12/2022 0808 Last data filed at 01/12/2022 0400 Gross per 24 hour  Intake 1700 ml  Output 530 ml  Net 1170 ml   Filed Weights   01/05/22 0500 01/06/22 0500 01/10/22 0500  Weight: 118.7 kg 118.9 kg 103.2 kg    Examination:  General: Chronically ill-appearing woman, laying in bed in no distress HENT: Oropharynx somewhat dry.   NG tube in place, no upper airway secretions PULM: Clear bilaterally, no wheezing or crackles CV: Regular, distant, no murmur GI: Nondistended, positive bowel sounds MSK: No edema Neuro: Wakes to voice, answers simple questions, poorly oriented, moves all extremities to command   Resolved Hospital Problem list   Hypertensive urgency  Assessment & Plan:  Acute upper GI bleeding from GI bleeding from esophageal varices in setting of NASH cirrhosis requiring TIPS Acute hepatic encephalopathy > worse 6/17 -Continue PPI -Continue rifaximin -Scheduled lactulose. 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 -Follow BMP, replace electrolytes as indicated  Paroxysmal atrial fibrillation with RVR Hypertension Chronic HFpER -Telemetry monitoring -Scheduled metoprolol  Fe def Anemia -Follow if any evidence of active blood loss -Transfusion goal hemoglobin 7.0  DM2 with hyperglycemia -Insulin glargine 26 units twice daily -Sliding scale insulin and CBG as per protocol  Dysphagia -Aspiration precautions -Modified diet, dysphagia 2  Goals of care: have met with both sisters Palliative care consulted, see  iPal note from 6/18  Best Practice (right click and "Reselect all SmartList Selections" daily)   Diet/type: dysphagia diet (see orders) DVT prophylaxis: SCD GI  prophylaxis: PPI Lines: Dialysis Catheter Foley:  Yes, and it is still needed Code Status:  DNR Last date of multidisciplinary goals of care discussion [note IPAL on 6/18]     Critical care time: 31 minutes    Baltazar Apo, MD, PhD 01/12/2022, 8:09 AM [facility] [phone] or if no answer before 7:00PM call [phone] For any issues after 7:00PM please call eLink [phone]

## 2022-01-13 DIAGNOSIS — I8500 Esophageal varices without bleeding: Secondary | ICD-10-CM | POA: Diagnosis not present

## 2022-01-13 DIAGNOSIS — G9340 Encephalopathy, unspecified: Secondary | ICD-10-CM | POA: Diagnosis not present

## 2022-01-13 DIAGNOSIS — Z66 Do not resuscitate: Secondary | ICD-10-CM | POA: Diagnosis not present

## 2022-01-13 DIAGNOSIS — Z515 Encounter for palliative care: Secondary | ICD-10-CM | POA: Diagnosis not present

## 2022-01-13 DIAGNOSIS — K7682 Hepatic encephalopathy: Secondary | ICD-10-CM | POA: Diagnosis not present

## 2022-01-13 DIAGNOSIS — N179 Acute kidney failure, unspecified: Secondary | ICD-10-CM | POA: Diagnosis not present

## 2022-01-13 DIAGNOSIS — J9601 Acute respiratory failure with hypoxia: Secondary | ICD-10-CM | POA: Diagnosis not present

## 2022-01-13 LAB — CBC
HCT: 23 % — ABNORMAL LOW (ref 36.0–46.0)
Hemoglobin: 7.2 g/dL — ABNORMAL LOW (ref 12.0–15.0)
MCH: 27.3 pg (ref 26.0–34.0)
MCHC: 31.3 g/dL (ref 30.0–36.0)
MCV: 87.1 fL (ref 80.0–100.0)
Platelets: 65 10*3/uL — ABNORMAL LOW (ref 150–400)
RBC: 2.64 MIL/uL — ABNORMAL LOW (ref 3.87–5.11)
RDW: 28.9 % — ABNORMAL HIGH (ref 11.5–15.5)
WBC: 10 10*3/uL (ref 4.0–10.5)
nRBC: 0 % (ref 0.0–0.2)

## 2022-01-13 LAB — BASIC METABOLIC PANEL
Anion gap: 6 (ref 5–15)
BUN: 95 mg/dL — ABNORMAL HIGH (ref 8–23)
CO2: 27 mmol/L (ref 22–32)
Calcium: 10.4 mg/dL — ABNORMAL HIGH (ref 8.9–10.3)
Chloride: 103 mmol/L (ref 98–111)
Creatinine, Ser: 3.41 mg/dL — ABNORMAL HIGH (ref 0.44–1.00)
GFR, Estimated: 14 mL/min — ABNORMAL LOW (ref >60)
Glucose, Bld: 154 mg/dL — ABNORMAL HIGH (ref 70–99)
Potassium: 4.9 mmol/L (ref 3.5–5.1)
Sodium: 136 mmol/L (ref 135–145)

## 2022-01-13 LAB — GLUCOSE, CAPILLARY
Glucose-Capillary: 135 mg/dL — ABNORMAL HIGH (ref 70–99)
Glucose-Capillary: 138 mg/dL — ABNORMAL HIGH (ref 70–99)
Glucose-Capillary: 146 mg/dL — ABNORMAL HIGH (ref 70–99)
Glucose-Capillary: 162 mg/dL — ABNORMAL HIGH (ref 70–99)
Glucose-Capillary: 182 mg/dL — ABNORMAL HIGH (ref 70–99)
Glucose-Capillary: 192 mg/dL — ABNORMAL HIGH (ref 70–99)

## 2022-01-13 LAB — PHOSPHORUS: Phosphorus: 5.1 mg/dL — ABNORMAL HIGH (ref 2.5–4.6)

## 2022-01-13 LAB — MAGNESIUM: Magnesium: 3.4 mg/dL — ABNORMAL HIGH (ref 1.7–2.4)

## 2022-01-13 MED ORDER — RENA-VITE PO TABS
1.0000 | ORAL_TABLET | Freq: Every day | ORAL | Status: DC
  Administered 2022-01-13: 1
  Filled 2022-01-13: qty 1

## 2022-01-13 MED ORDER — OXYCODONE HCL 5 MG/5ML PO SOLN
2.5000 mg | Freq: Three times a day (TID) | ORAL | Status: DC | PRN
  Administered 2022-01-13: 2.5 mg
  Filled 2022-01-13 (×2): qty 5

## 2022-01-13 MED ORDER — OXYCODONE HCL 5 MG/5ML PO SOLN
2.5000 mg | ORAL | Status: DC | PRN
  Administered 2022-01-13 – 2022-01-14 (×4): 5 mg
  Filled 2022-01-13 (×3): qty 5

## 2022-01-13 MED ORDER — PROSOURCE TF PO LIQD
45.0000 mL | Freq: Every day | ORAL | Status: DC
  Administered 2022-01-13 – 2022-01-14 (×2): 45 mL
  Filled 2022-01-13 (×2): qty 45

## 2022-01-13 MED ORDER — OSMOLITE 1.5 CAL PO LIQD
1000.0000 mL | ORAL | Status: DC
  Administered 2022-01-13: 1000 mL
  Filled 2022-01-13: qty 1000

## 2022-01-13 NOTE — Progress Notes (Signed)
Patient ID: Chelsea [name], female   DOB: 1955/11/27, 66 y.o.   MRN: [id number]    Progress Note from the Palliative Medicine Team at [facility]   Patient Name: Chelsea [name]        Date: 01/13/2022 DOB: 10-19-1955  Age: 66 y.o. MRN#: [id number] Attending Physician: Collene Gobble, MD Primary Care Physician: [name], MD Admit Date: 12/30/2021   Medical records reviewed    66 y.o. female  admitted on 01/14/2022 with cirrhosis from NASH who had a recent esophageal variceal bleed requiring intubation and eventual TIPS.  After TIPS she has had a prolonged ICU stay for respiratory failure and renal failure.   Significant    6/06 EGD with varix banding at [facility], intubated, start octreotide/protonix gtt, transfer to [facility] for TIPS 6/7 extubated 6/8 started on cardizem for HTN and A.fib RVR 6/12 renal failure developing anuria. Nephrology consulted. ? HRS. Octreotide started.  6/14 poor UOP continued and dyspnea worsened. HD started 6/15 starting CRRT, unable to come off BiPAP.  6/16 R thoracentesis> transudate, 1300 cc removed; vomited and aspirated 6/18 off BIPAP, CRRT held, planning family conversation.   01/11/22 Not a candidate for further dialysis, discussed with family  01/12/22   Creatinine 3.22 today    Patient and family face treatment option decisions, advanced directive decisions and anticipatory care needs.  Dr. Lamonte Sakai updated family from medical standpoint.  Patient is very lethargic today,  speech is garbled and patient cannot participate in today's conversation and does not have medical decision-making capacity at this time.    This NP visited patient at the bedside as a follow up to initial [place] and for palliative medicine needs and emotional support.  I met today for scheduled meeting with patient's sister/[name] , sister/Susan, brother/daughter Fritz Pickerel and and sister-in-law Joseph Art for continued conversation regarding current medical situation.  Education  offered on the seriousness of patient's current medical situation secondary to underlying liver disease, renal failure and worsening neurologic condition.  Education offered on concept of adult failure to thrive and the limitations of medical interventions to prolong quality of life when the body does fail to thrive.  All family present verbalize an understanding of the seriousness of the patient's current medical situation.  Patient's sister Curly Shores who has been at the bedside for many days reports that patient has been verbalizing that she is tired and ready to go.  Through the night she was speaking with dead relatives.  Plan of care: -DNR/DNI - Family wish to continue current support  for the next 24 hours, family remain hopeful for improvement.  Remeet tomorrow afternoon with this nurse practitioner for further discussion regarding neck steps and plan of care. -During this time comfort and dignity are priority, utilize medications for symptom management  Family prepared that anything could happen at any time and that likely long-term prognosis is poor.  Education offered today regarding  the importance of continued conversation with family and their  medical providers regarding overall plan of care and treatment options,  ensuring decisions are within the context of the patients values and GOCs.  Questions and concerns addressed Discussed with Dr. Lamonte Sakai and bedside RN  Wadie Lessen NP  Palliative Medicine Team Team Phone # [phone] Pager [phone]

## 2022-01-13 NOTE — Progress Notes (Signed)
RT NOTE: patient resting comfortably with no respiratory distress noted.  Bipap currently not indicated at this time.  Will continue to monitor.

## 2022-01-13 NOTE — Progress Notes (Signed)
NAME:  Chelsea [name], MRN:  [id number], DOB:  02-12-1956, LOS: 43 ADMISSION DATE:  12/28/2021, CONSULTATION DATE:  6/6 REFERRING MD:  [name], CHIEF COMPLAINT:  Hematemesis   History of Present Illness:  66 y/o female with cirrhosis from NASH who had a recent esophageal variceal bleed requiring intubation and eventual TIPS.  After TIPS she has had a prolonged ICU stay for respiratory failure and renal failure.  Pertinent  Medical History  Fe Def Anemia DM2 Atrial fibrillation NASH cirrhosis COPD CHF Hyperlipidemia OSA CVA Hypertension  Significant Hospital Events: Including procedures, antibiotic start and stop dates in addition to other pertinent events   6/6 EGD with varix banding at [facility], intubated, start octreotide/protonix gtt, transfer to [facility] for TIPS 6/7 extubated 6/8 started on cardizem for HTN and A.fib RVR 6/12 renal failure developing anuria. Nephrology consulted. ? HRS. Octreotide started.  6/14 Poor UOP continued and dyspnea worsened. HD started 6/15 starting CRRT, unable to come off BiPAP.  6/16 R thoracentesis> transudate, 1300 cc removed; vomited and aspirated 6/18 off BIPAP, CRRT held, planning family conversation  Interim History / Subjective:  No acute issues overnight  Creatinine and BUN both rose again overnight but urine output increased to 1.2L in the last 24hrs   Objective   Blood pressure 127/61, pulse 79, temperature 97.7 F (36.5 C), temperature source Oral, resp. rate 17, height '5\' 7"'  (1.702 m), weight 103.2 kg, SpO2 100 %.    FiO2 (%):  [40 %] 40 %   Intake/Output Summary (Last 24 hours) at 01/13/2022 1779 Last data filed at 01/13/2022 0400 Gross per 24 hour  Intake 1665 ml  Output 1286 ml  Net 379 ml    Filed Weights   01/05/22 0500 01/06/22 0500 01/10/22 0500  Weight: 118.7 kg 118.9 kg 103.2 kg    Examination: General: Acute on chronically ill appearing deconditioned elderly female lying in  NAD HEENT: [place]/AT, MM pink/moist,  PERRL,  Neuro: Sleepy but will arouse to verbal stimuli, able to follow all commands  CV: s1s2 regular rate and rhythm, no murmur, rubs, or gallops,  PULM:  clear to ascultation, no increased work of breathing, no added breath sounds  GI: soft, bowel sounds active in all 4 quadrants, non-tender, non-distended, tolerating TF Extremities: warm/dry, no edema  Skin: no rashes or lesions  Resolved Hospital Problem list   Hypertensive urgency  Assessment & Plan:  Acute upper GI bleeding from GI bleeding from esophageal varices in setting of NASH cirrhosis requiring TIPS Acute hepatic encephalopathy  -Worse 6/17 P: GI evaluated and have since signed off 6/14 Continue PPI, Rifaximin, and Lactulose  Continue to monitor to signs of bleeding  Supportive care   Acute hypoxemic respiratory failure due to volume overload -Post thoracentesis, patent was weaned off BIPAP and now on 2L [place] Aspiration pneumonia -Completed antibiotics 6/14 Aspirated 6/16 but doesn't have pneumonia 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 Follow intermittent chest x-ray and ABG.   BIPAP as needed  Ensure adequate pulmonary hygiene  Mobilize as able   AKI presumed contrast induced nephropathy;  -CRRT held on 6/18 -Urine output picked up 6/21, 1.2L out in the last 24hrs -Not a candidate for restart HD, long-term HD, per nephrology family confirmed no resumption of hemodialysis as of 6/19  P: Nephrology following  Continue midodrine  Follow renal function  Monitor urine output Trend Bmet Avoid nephrotoxins Ensure adequate renal perfusion   Paroxysmal  atrial fibrillation with RVR Hypertension Chronic HFpER P: Continuous telemetry  Continue beta blocker  Strict intake and output  Closely monitor renal function and electrolytes   Iron def Anemia P: Trend CBC  Transfuse per protocol  Hgb goal > 7  DM2 with  hyperglycemia P: Continue SSI and long acting insulin  CBG q4  Dysphagia P: Dysphagia 2 diet  Continue TF   Goals of care: have met with both sisters Palliative care consulted, see iPal note from 6/18  Best Practice (right click and "Reselect all SmartList Selections" daily)   Diet/type: dysphagia diet (see orders) DVT prophylaxis: SCD GI prophylaxis: PPI Lines: Dialysis Catheter Foley:  Yes, and it is still needed Code Status:  DNR Last date of multidisciplinary goals of care discussion [note IPAL on 6/18]  Critical care time: NA  Lovetta Condie D. Kenton Kingfisher, NP-C [place] [facility] Personal contact information can be found on Amion  01/13/2022, 8:12 AM

## 2022-01-13 NOTE — Progress Notes (Signed)
[facility] Progress Note    Assessment/ Plan:   Acute Kidney Injury - likely secondary to contrast induced nephropathy as renal function started worsening after the needed IV contrast administration. But part of the differential is MPGN and HRS type 1. She is already being treated with Midodrine and initially Octreotide (stopped 6/14).  Urine microscopy evaluated on 6/13 -> WBC, normomorphic RBC's, budding yeast, no RBC/ WBC casts or dysmorphic RBC's. 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 Renal function was normal prior to and then steadily worsened post contrast. Significant contrast was required for the TIPS procedure.  - UOP is better but really poor response to high dose diuretics -> HD on 6/15 (came off @0400 ) and able to get 3L off but still significantly overloaded and then started on CRRT 6/15-6/18.  - not a candidate for further dialysis- discussed risks of more dialysis with pt's sister in the setting of her advanced liver disease- sister affirmed no further dialysis 01/11/22 - UOP slightly improving, Cr plateauing but BUN continues to rise--> not out of the woods.  Recovery could take place but still liver disease is the driving factor in all of this - greatly appreciate palliative care- family meeting 6/21 - will as IV team to take out nontunneled HD cath for comfort - nothing further to add- will sign off.  Call with questions    -Maintain MAP>65 for optimal renal perfusion.  - Avoid nephrotoxic medications including NSAIDs and iodinated intravenous contrast exposure unless the latter is absolutely indicated.   - Preferred narcotic agents for pain control are hydromorphone, fentanyl, and methadone. Morphine should not be used.  - Avoid Baclofen and avoid oral sodium phosphate and magnesium citrate based laxatives / bowel preps.  - Continue strict Input and Output monitoring. Will monitor the patient closely with  you and intervene or adjust therapy as indicated by changes in clinical status/labs   Iron deficiency anemia with h/o UGI bleed.  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  Subjective:    Cr looks like it may be plateauing.  Family meeting today.  Sister is asking about a hospice house.      Objective:   BP 136/60   Pulse 79   Temp 97.8 F (36.6 C) (Oral)   Resp 18   Ht 5\' 7"  (1.702 m)   Wt 103.2 kg   SpO2 99%   BMI 35.63 kg/m   Intake/Output Summary (Last 24 hours) at 01/13/2022 1058 Last data filed at 01/13/2022 0900 Gross per 24 hour  Intake 1485.92 ml  Output 1245 ml  Net 240.92 ml   Weight change:   Physical Exam: 12/26/2021, stirs briefly to voice  CVS: RRR Resp: even and unlabored, clear anteriorly Abd: soft Ext: trace LE edema  Imaging: No results found.  Labs: BMET Recent Labs  Lab 01/09/22 0303 01/09/22 1535 01/10/22 0023 01/10/22 1649 01/11/22 0305 01/12/22 0359 01/13/22 0356  NA 134*  134* 137 134*  135 133* 136 137 136  K 4.5  4.5 4.6 4.5  4.6 4.7 4.8 4.9 4.9  CL 104  105 105 105  106 101 104 104 103  CO2 25  24 26 25  26 27 27 26 27   GLUCOSE 93  93 194* 153*  153* 154* 158* 164* 154*  BUN 27*  27* 26* 27*  27* 39* 51* 74* 95*  CREATININE 1.82*  1.75* 1.74* 1.65*  1.71* 2.16* 2.61* 3.22* 3.41*  CALCIUM 9.4  9.4 9.9 9.7  9.7 10.0 10.2 10.5* 10.4*  PHOS 2.9  2.9 3.2 3.1 3.6 4.3 5.4* 5.1*   CBC Recent Labs  Lab 01/10/22 0023 01/11/22 0305 01/12/22 0359 01/13/22 0356  WBC 11.9* 10.4 9.4 10.0  HGB 8.7* 7.5* 7.4* 7.2*  HCT 27.4* 23.1* 23.2* 23.0*  MCV 86.2 85.9 86.6 87.1  PLT 63* 56* 58* 65*    Medications:     vitamin C  250 mg Per Tube BID   B-complex with vitamin C  1 tablet Per Tube Daily   Chlorhexidine Gluconate Cloth  6 each Topical Daily   feeding supplement  237 mL Oral TID BM   feeding  supplement (PIVOT 1.5 CAL)  1,000 mL Per Tube Q24H   folic acid  1 mg Per Tube Daily   insulin aspart  0-20 Units Subcutaneous Q4H   insulin glargine-yfgn  26 Units Subcutaneous BID   lactulose  10 g Per Tube TID   metoprolol tartrate  25 mg Per Tube BID   pantoprazole sodium  40 mg Per Tube BID   rifaximin  550 mg Per Tube BID   sodium chloride flush  10-40 mL Intracatheter Q12H      Bufford Buttner MD 01/13/2022, 10:58 AM

## 2022-01-13 NOTE — Progress Notes (Signed)
PT Cancellation Note  Patient Details Name: Chelsea [name] MRN: [id number] DOB: 1956-06-15   Cancelled Treatment:    Reason Eval/Treat Not Completed: Patient not medically ready;Medical issues which prohibited therapy.  RN asked to hold today, pt tired and having hard time today. 01/13/2022  Jacinto Halim., PT Acute Rehabilitation Services [phone]  (pager) [phone]  (office)   [name] [name] 01/13/2022, 12:26 PM

## 2022-01-13 NOTE — Progress Notes (Signed)
OT Cancellation Note  Patient Details Name: CAROLE DONER MRN: [id number] DOB: [birth year]   Cancelled Treatment:    Reason Eval/Treat Not Completed: Medical issues which prohibited therapy. Pt having a rough morning per RN, asking to hold OT/PT for now.   Raynald Kemp, OT Acute Rehabilitation Services Office: [phone]   Pilar Grammes 01/13/2022, 12:31 PM

## 2022-01-13 NOTE — Progress Notes (Signed)
Nutrition Follow-up  DOCUMENTATION CODES:   Obesity unspecified  INTERVENTION:   Continue tube feeds via Cortrak: - Change to Osmolite 1.5 @ 50 ml/hr (1200 ml/day) - ProSource TF 45 ml daily  Tube feeding regimen provides 1840 kcal, 86 grams of protein, and 914 ml of H2O.   - Add renal MVI daily  - d/c Ensure Enlive as pt is refusing per RN  NUTRITION DIAGNOSIS:   Inadequate oral intake related to dysphagia as evidenced by NPO status.  Progressing, pt on dysphagia 1 diet with thin liquids but minimal PO intake  GOAL:   Patient will meet greater than or equal to 90% of their needs  Met via TF  MONITOR:   PO intake, Labs, Weight trends, TF tolerance, I & O's  REASON FOR ASSESSMENT:   Consult Enteral/tube feeding initiation and management  ASSESSMENT:   66 year old female who presented on 6/06 for EGD for grade 3 varices. Pt experienced difficulty with bleeding and required intubation for airway protection during the procedure. PMH of NASH with cirrhosis, esophageal varices, atrial fibrillation, iron deficiency anemia, T2DM, COPD, CHF, HLD, OSA, CVA, HTN.  06/06 - s/p TIPS, L gastric vein coil embolization 06/07 - extubated 06/08 - full liquid diet, later NPO 06/09 - Cortrak placed (tip gastric) 06/13 - s/p MBS, diet advanced to clear liquids 06/14 - diet advanced to dysphagia 2 with thin liquids 06/15 - first iHD, starting CRRT 06/16 - transitioned to nocturnal TF, s/p thoracentesis with 1300 ml fluid removed, vomited 06/17 - TF changed back to continuous 06/18 - CRRT discontinued 06/20 - diet downgraded to dysphagia 1  Discussed pt with RN and during ICU rounds. [place] meeting scheduled for this afternoon. Noted no further plans for dialysis.  Pt back on continuous tube feeds with poor PO intake. Diet downgraded to dysphagia 1. Pt refusing Ensure supplements. RD to d/c. Given CRRT has been d/c, pt no longer requires Pivot 1.5 formula. Will change back to standard  formula and add renal MVI.  Admit weight: 110.2 kg Current weight: 103.2 kg  Current TF: Pivot 1.5 @ 55 ml/hr  Meal Completion: 0-50%  Medications reviewed and include: SSI q 4 hours, semglee 26 units BID, lactulose 10 grams TID, protonix  Labs reviewed: BUN 95, creatinine 3.41, phosphorus 5.1, magnesium 3.4, hemoglobin 7.2, platelets 65 CBG's: 135-175 x 24 hours  UOP: 1285 ml x 24 hours I/O's: -6.7 L since admit  Diet Order:   Diet Order             DIET - DYS 1 Room service appropriate? Yes; Fluid consistency: Thin  Diet effective now                   EDUCATION NEEDS:   Not appropriate for education at this time  Skin:  Skin Assessment: Skin Integrity Issues: Stage I: nose  Last BM:  01/12/22 multiple type 5  Height:   Ht Readings from Last 1 Encounters:  12/28/2021 '5\' 7"'  (1.702 m)    Weight:   Wt Readings from Last 1 Encounters:  01/10/22 103.2 kg    BMI:  Body mass index is 35.63 kg/m.  Estimated Nutritional Needs:   Kcal:  1800-2000  Protein:  85-100 grams  Fluid:  1.8 L    Gustavus Bryant, MS, RD, LDN Inpatient Clinical Dietitian Please see AMiON for contact information.

## 2022-01-14 DIAGNOSIS — G9340 Encephalopathy, unspecified: Secondary | ICD-10-CM | POA: Diagnosis not present

## 2022-01-14 DIAGNOSIS — Z66 Do not resuscitate: Secondary | ICD-10-CM | POA: Diagnosis not present

## 2022-01-14 DIAGNOSIS — R0609 Other forms of dyspnea: Secondary | ICD-10-CM

## 2022-01-14 DIAGNOSIS — Z515 Encounter for palliative care: Secondary | ICD-10-CM | POA: Diagnosis not present

## 2022-01-14 DIAGNOSIS — N179 Acute kidney failure, unspecified: Secondary | ICD-10-CM | POA: Diagnosis not present

## 2022-01-14 LAB — BASIC METABOLIC PANEL
Anion gap: 10 (ref 5–15)
BUN: 109 mg/dL — ABNORMAL HIGH (ref 8–23)
CO2: 25 mmol/L (ref 22–32)
Calcium: 10.3 mg/dL (ref 8.9–10.3)
Chloride: 100 mmol/L (ref 98–111)
Creatinine, Ser: 3.54 mg/dL — ABNORMAL HIGH (ref 0.44–1.00)
GFR, Estimated: 14 mL/min — ABNORMAL LOW (ref >60)
Glucose, Bld: 241 mg/dL — ABNORMAL HIGH (ref 70–99)
Potassium: 5.4 mmol/L — ABNORMAL HIGH (ref 3.5–5.1)
Sodium: 135 mmol/L (ref 135–145)

## 2022-01-14 LAB — GLUCOSE, CAPILLARY
Glucose-Capillary: 192 mg/dL — ABNORMAL HIGH (ref 70–99)
Glucose-Capillary: 236 mg/dL — ABNORMAL HIGH (ref 70–99)

## 2022-01-14 MED ORDER — ACETAMINOPHEN 325 MG PO TABS: 650.0000 mg | ORAL_TABLET | Freq: Four times a day (QID) | ORAL | Status: DC | PRN

## 2022-01-14 MED ORDER — POLYVINYL ALCOHOL 1.4 % OP SOLN: 1.0000 [drp] | Freq: Four times a day (QID) | OPHTHALMIC | Status: DC | PRN

## 2022-01-14 MED ORDER — GLYCOPYRROLATE 0.2 MG/ML IJ SOLN: 0.2000 mg | INTRAMUSCULAR | Status: DC | PRN

## 2022-01-14 MED ORDER — LORAZEPAM 2 MG/ML IJ SOLN
2.0000 mg | INTRAMUSCULAR | Status: DC | PRN
Start: 2022-01-14 — End: 2022-01-15

## 2022-01-14 MED ORDER — GLYCOPYRROLATE 1 MG PO TABS: 1.0000 mg | ORAL_TABLET | ORAL | Status: DC | PRN

## 2022-01-14 MED ORDER — HALOPERIDOL LACTATE 5 MG/ML IJ SOLN
INTRAMUSCULAR | Status: AC
  Administered 2022-01-14: 4 mg via INTRAVENOUS
  Filled 2022-01-14: qty 1

## 2022-01-14 MED ORDER — ACETAMINOPHEN 650 MG RE SUPP: 650.0000 mg | Freq: Four times a day (QID) | RECTAL | Status: DC | PRN

## 2022-01-14 MED ORDER — LORAZEPAM 2 MG/ML IJ SOLN
2.0000 mg | INTRAMUSCULAR | Status: DC | PRN
  Administered 2022-01-14: 2 mg via INTRAVENOUS
  Filled 2022-01-14: qty 1

## 2022-01-14 MED ORDER — HALOPERIDOL LACTATE 5 MG/ML IJ SOLN
2.0000 mg | Freq: Four times a day (QID) | INTRAMUSCULAR | Status: DC | PRN
  Administered 2022-01-14: 2 mg via INTRAVENOUS
  Filled 2022-01-14: qty 1

## 2022-01-14 MED ORDER — MORPHINE 100MG IN NS 100ML (1MG/ML) PREMIX INFUSION
1.0000 mg/h | INTRAVENOUS | Status: DC
  Administered 2022-01-14: 1 mg/h via INTRAVENOUS
  Administered 2022-01-15: 6 mg/h via INTRAVENOUS
  Filled 2022-01-14 (×3): qty 100

## 2022-01-14 MED ORDER — HALOPERIDOL LACTATE 5 MG/ML IJ SOLN: 1.0000 mg | INTRAMUSCULAR | Status: DC | PRN

## 2022-01-14 MED ORDER — DIPHENHYDRAMINE HCL 50 MG/ML IJ SOLN: 25.0000 mg | INTRAMUSCULAR | Status: DC | PRN

## 2022-01-14 MED ORDER — HALOPERIDOL LACTATE 5 MG/ML IJ SOLN
2.5000 mg | INTRAMUSCULAR | Status: DC | PRN
  Administered 2022-01-14: 5 mg via INTRAVENOUS
  Filled 2022-01-14: qty 1

## 2022-01-14 MED ORDER — MORPHINE SULFATE (PF) 2 MG/ML IV SOLN
2.0000 mg | INTRAVENOUS | Status: DC | PRN
  Administered 2022-01-14: 2 mg via INTRAVENOUS
  Filled 2022-01-14: qty 1

## 2022-01-14 NOTE — Progress Notes (Signed)
Pt transferred from 61M this evening on comfort care measures. Morphine drip running at 6mg /hr. Family at bedside

## 2022-01-14 NOTE — Progress Notes (Signed)
Patient resting comfortably with no distress noted at this time.  Bipap currently not indicated.  Will continue to monitor.

## 2022-01-14 NOTE — Progress Notes (Signed)
Patient ID: LAVONNE KINDERMAN, female   DOB: 1955-10-22, 66 y.o.   MRN: [id number]    Progress Note from the Palliative Medicine Team at [facility]   Patient Name: Chelsea [name]        Date: 01/14/2022 DOB: 21-May-1956  Age: 65 y.o. MRN#: [id number] Attending Physician: Leslye Peer, MD Primary Care Physician: Richardean Chimera, MD Admit Date: 01/08/2022   Medical records reviewed    66 y.o. female  admitted on 12/31/2021 with cirrhosis from NASH who had a recent esophageal variceal bleed requiring intubation and eventual TIPS.  After TIPS she has had a prolonged ICU stay for respiratory failure and renal failure.   Significant    6/06 EGD with varix banding at [facility], intubated, start octreotide/protonix gtt, transfer to [facility] for TIPS 6/7 extubated 6/8 started on cardizem for HTN and A.fib RVR 6/12 renal failure developing anuria. Nephrology consulted. ? HRS. Octreotide started.  6/14 poor UOP continued and dyspnea worsened. HD started 6/15 starting CRRT, unable to come off BiPAP.  6/16 R thoracentesis> transudate, 1300 cc removed; vomited and aspirated 6/18 off BIPAP, CRRT held, planning family conversation.   01/11/22 Not a candidate for further dialysis, discussed with family  01/12/22   Creatinine 3.22 today  01-14-22 decision for full comfort made with conversation with critical care.   Patient is unresponsive, she appears comfortable.  Family at bedside.  Family verbalized comfort with decision to focus on comfort and dignity at end-of-life.  Education offered on the natural trajectory and expectations at end-of-life.   Plan of care: -DNR/DNI -Comfort and dignity at end-of-life -Symptom management    -Morphine drip, Ativan as needed    -Robinul for terminal secretions -Prognosis is likely hours to days   Questions and concerns addressed Discussed with  bedside RN and Janeann Forehand, NP  This nurse practitioner informed  the family and the attending that I will be out of  the hospital until Monday morning.  If the patient is still hospitalized I will follow-up at that time.  Call palliative medicine team phone # [phone] with questions or concerns in the interim   Lorinda Creed NP  Palliative Medicine Team Team Phone # [phone] Pager [phone]

## 2022-01-14 NOTE — Progress Notes (Signed)
Comfort care pt - no BP needed at this time

## 2022-01-14 NOTE — Progress Notes (Signed)
NAME:  Chelsea [name], MRN:  [id number], DOB:  [birth year], LOS: 16 ADMISSION DATE:  01-24-22, CONSULTATION DATE:  6/6 REFERRING MD:  Levon Hedger, CHIEF COMPLAINT:  Hematemesis   History of Present Illness:  66 y/o female with cirrhosis from NASH who had a recent esophageal variceal bleed requiring intubation and eventual TIPS.  After TIPS she has had a prolonged ICU stay for respiratory failure and renal failure.  Pertinent  Medical History  Fe Def Anemia DM2 Atrial fibrillation NASH cirrhosis COPD CHF Hyperlipidemia OSA CVA Hypertension  Significant Hospital Events: Including procedures, antibiotic start and stop dates in addition to other pertinent events   6/6 EGD with varix banding at [facility], intubated, start octreotide/protonix gtt, transfer to [facility] for TIPS 6/7 extubated 6/8 started on cardizem for HTN and A.fib RVR 6/12 renal failure developing anuria. Nephrology consulted. ? HRS. Octreotide started.  6/14 Poor UOP continued and dyspnea worsened. HD started 6/15 starting CRRT, unable to come off BiPAP.  6/16 R thoracentesis> transudate, 1300 cc removed; vomited and aspirated 6/18 off BIPAP, CRRT held, planning family conversation 6/22 Agitated delirium developed overnight with need of safety restraints and Haldol. Sister updated at bedside with decision made to slowly transition into comfort care this am until family can be updated.   Interim History / Subjective:  Agitated in bed with delirium   Objective   Blood pressure (!) 153/63, pulse 89, temperature 97.8 F (36.6 C), temperature source Axillary, resp. rate (!) 22, height 5\' 7"  (1.702 m), weight 102.5 kg, SpO2 91 %.        Intake/Output Summary (Last 24 hours) at 01/14/2022 0701 Last data filed at 01/14/2022 0600 Gross per 24 hour  Intake 2185 ml  Output 1060 ml  Net 1125 ml    Filed Weights   01/06/22 0500 01/10/22 0500 01/14/22 0500  Weight: 118.9 kg 103.2 kg 102.5 kg    Examination: General: Acute on  chronically ill appearing elderly female lying in bed, in NAD HEENT: [place]/AT, MM pink/moist, PERRL,  Neuro: Agitated with slight thrashing in bed, unable to follow commands  CV: s1s2 regular rate and rhythm, no murmur, rubs, or gallops,  PULM:  Clear to ascultation, no increased work of breathing GI: soft, bowel sounds active in all 4 quadrants, non-tender, non-distended Extremities: warm/dry, no edema  Skin: no rashes or lesions  Resolved Hospital Problem list   Hypertensive urgency  Assessment & Plan:  Acute upper GI bleeding from GI bleeding from esophageal varices in setting of NASH cirrhosis requiring TIPS Acute hepatic encephalopathy  -Mentation continues to decline, with large drop off 6/22 -GI evaluated and have since signed off 6/14 Acute hypoxemic respiratory failure due to volume overload -Post thoracentesis, patent was weaned off BIPAP and now on 2L [place] Aspiration pneumonia -Completed antibiotics 6/14 Aspirated 6/16 but doesn't have pneumonia 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 Decision at that time was to continue supportive care for the next 24hrs to allow patient time for recovery.   Unfortunately overnight into AM of 6/22 patients mentation has declined with  progressive delirium seen. Patient is also having more arrhythmias with bigeminy and short runs of VT. Mentation decline and arrhythmias likely indicate worsening renal failure (bmet currently pending).   Discussion held with  patient sister this am and given change in status decision was made to start comfort medications this am with plans to update family. Once all family members have been updated plan is to proceed with full comfort measures. Orders placed.    Best Practice (right click and "Reselect all SmartList Selections" daily)   Diet/type: dysphagia diet (see orders) DVT prophylaxis: SCD GI prophylaxis: PPI Lines: Dialysis Catheter Foley:  Yes, and it is still needed Code Status:  DNR Last date of multidisciplinary goals of care discussion: See above   Critical care time: NA  Attallah Ontko D. Tiburcio Pea, NP-C [place] [facility] Personal contact information can be found on Amion  01/14/2022, 7:01 AM

## 2022-01-14 NOTE — IPAL (Signed)
  Interdisciplinary Goals of Care Family Meeting   Date carried out: 01/14/2022  Location of the meeting: Bedside  Member's involved: Nurse Practitioner and Family Member or next of kin  Durable Power of Attorney or acting medical decision maker: Shared among sisters     Discussion: I discussed current clinical picture with progressive decline to all sisters with decision made to proceed with transition to comfort care only. We will start a Morphine drip now and remove all equipment no necessary for comfort.   Code status: Full DNR  Disposition: In-patient comfort care   Time spent for the meeting: 25 mins  Chelsea [name] 01/14/2022, 10:00 AM

## 2022-01-14 NOTE — Progress Notes (Signed)
PT Cancellation Note  Patient Details Name: Chelsea [name] MRN: [id number] DOB: 1955/12/30   Cancelled Treatment:    Reason Eval/Treat Not Completed: Other (comment). Pt with delirium overnight and family likely moving toward comfort care.   Angelina Ok [facility] 01/14/2022, 8:46 AM Skip Mayer PT Acute Rehabilitation Services Office [phone]

## 2022-01-14 NOTE — Progress Notes (Signed)
SLP Cancellation Note  Patient Details Name: Chelsea [name] MRN: [id number] DOB: [birth year]   Cancelled treatment:       Reason Eval/Treat Not Completed: Medical issues which prohibited therapy. Per chart review: "Unfortunately overnight into AM of 6/22 patients mentation has declined with progressive delirium seen. Patient is also having more arrhythmias with bigeminy and short runs of VT. Mentation decline and arrhythmias likely indicate worsening renal failure (bmet currently pending).  Discussion held with patient sister this am and given change in status decision was made to start comfort medications this am with plans to update family. Once all family members have been updated plan is to proceed with full comfort measures."   Patient is currently NPO. SLP will follow today for any guidance needed in regards to PO's for comfort. Based on her performance, suspect this will be Dys 1 (puree) solids and thin liquids.    Angela Nevin, MA, CCC-SLP Speech Therapy

## 2022-01-14 NOTE — Progress Notes (Signed)
Pt transferred to 6N 21 by this RN and NT [place]. Report given to [facility] RN. All belongings transported with pt's sister [place].

## 2022-01-14 NOTE — Progress Notes (Signed)
Pt is comfort care. No signs of acute distress. Pt is resting comfortably in bed with family at bedside.

## 2022-01-14 NOTE — Progress Notes (Signed)
OT Cancellation Note  Patient Details Name: Chelsea [name] MRN: [id number] DOB: 1956-05-08   Cancelled Treatment:    Reason Eval/Treat Not Completed: Medical issues which prohibited therapy. Pt with increasing delirium overnight, family meeting today to discuss moving towards comfort care. OT will follow up, pending families decision.   [name] [name] 01/14/2022, 9:40 AM

## 2022-01-15 ENCOUNTER — Ambulatory Visit: Payer: PRIVATE HEALTH INSURANCE | Admitting: Adult Health

## 2022-01-15 LAB — CHOLESTEROL, BODY FLUID: Cholesterol, Fluid: 21 mg/dL

## 2022-01-15 LAB — TRIGLYCERIDES, BODY FLUIDS: Triglycerides, Fluid: 125 mg/dL

## 2022-01-23 DEATH — deceased

## 2022-02-08 ENCOUNTER — Ambulatory Visit: Payer: PRIVATE HEALTH INSURANCE | Admitting: Cardiology

## 2022-02-11 LAB — FUNGUS CULTURE WITH STAIN

## 2022-02-11 LAB — FUNGUS CULTURE RESULT

## 2022-02-11 LAB — FUNGAL ORGANISM REFLEX

## 2022-02-13 DIAGNOSIS — J690 Pneumonitis due to inhalation of food and vomit: Secondary | ICD-10-CM | POA: Diagnosis not present

## 2022-02-13 DIAGNOSIS — J449 Chronic obstructive pulmonary disease, unspecified: Secondary | ICD-10-CM | POA: Diagnosis present

## 2022-02-13 DIAGNOSIS — I498 Other specified cardiac arrhythmias: Secondary | ICD-10-CM | POA: Diagnosis not present

## 2022-02-13 DIAGNOSIS — I472 Ventricular tachycardia, unspecified: Secondary | ICD-10-CM | POA: Diagnosis not present

## 2022-02-13 DIAGNOSIS — R410 Disorientation, unspecified: Secondary | ICD-10-CM | POA: Diagnosis not present

## 2022-02-23 NOTE — Death Summary Note (Signed)
  DEATH SUMMARY   Patient Details  Name: Chelsea [name] MRN: [id number] DOB: 05-31-1956  Admission/Discharge Information   Admit Date:  01/23/22  Date of Death: Date of Death: Feb 09, 2022  Time of Death: Time of Death: 0720  Length of Stay: 05-Dec-2022  Referring Physician: Richardean Chimera, MD   Reason(s) for Hospitalization  Hematemesis   Diagnoses  Preliminary cause of death:  Acute multifactorial renal failure  Secondary Diagnoses (including complications and co-morbidities):  Principal Problem:   Acute renal failure (ARF) (HCC) Active Problems:   Diabetes mellitus (HCC)   Essential hypertension, benign   Atrial fibrillation (HCC)   Sleep apnea   Esophageal varices in cirrhosis (HCC)   Liver cirrhosis secondary to NASH (HCC)   Iron deficiency anemia   Acute blood loss anemia   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 year old female with multiple medical problems including NASH cirrhosis and resultant esophageal varices, diabetes, hypertension with diastolic CHF, OSA, prior CVA, atrial fibrillation, COPD. She was hospitalized 5/24-5/29/2023 at [facility] for upper GI bleeding on Coumadin for atrial fibrillation.  An EGD on 23-Jan-2022 showed grade 3 varices.  She required intubation and airway protection for that procedure.  There was significant bleeding and TIPS was recommended which was done urgently 6/6 at  [facility].  He was extubated on 6/7.  Course complicated by atrial fibrillation and RVR, hypertension and evolving oliguric/anuric renal failure.  She had a resultant aspiration pneumonia and was treated with a full course of antibiotics.  Developed recurrent right pleural effusion.  Thoracentesis was performed on 6/16 it was felt that this was principally due to her hepatic failure as it was a transudate.  Continue progressive renal failure and started continuous hemodialysis on 6/14.  She required BiPAP support.  6/18 she was able to be off BiPAP, still had some encephalopathy and some intermittent delirium.  Her CVVHD was held.  Discussions were had with the patient's family regarding goals for care.  It was felt that she would not want long-term hemodialysis.  Several days of monitoring was performed to see if she would regain renal function after cessation of hemodialysis.  Unfortunately she did not do so, continued to have progressive renal failure.  She evolved agitated delirium, progressive encephalopathy due to her renal status.  She was transition to comfort care 01/14/2022     Leslye Peer 02/13/2022, 7:40 PM

## 2022-06-03 ENCOUNTER — Ambulatory Visit (INDEPENDENT_AMBULATORY_CARE_PROVIDER_SITE_OTHER): Payer: Self-pay | Admitting: Gastroenterology

## 2023-05-24 IMAGING — DX DG CHEST 1V PORT
1 series · 1 of 1 positions shown · non-contrast
Comparison: CT chest 12/13/2020

CLINICAL DATA: Her story failure

EXAM:
PORTABLE CHEST 1 VIEW

[chest ap]
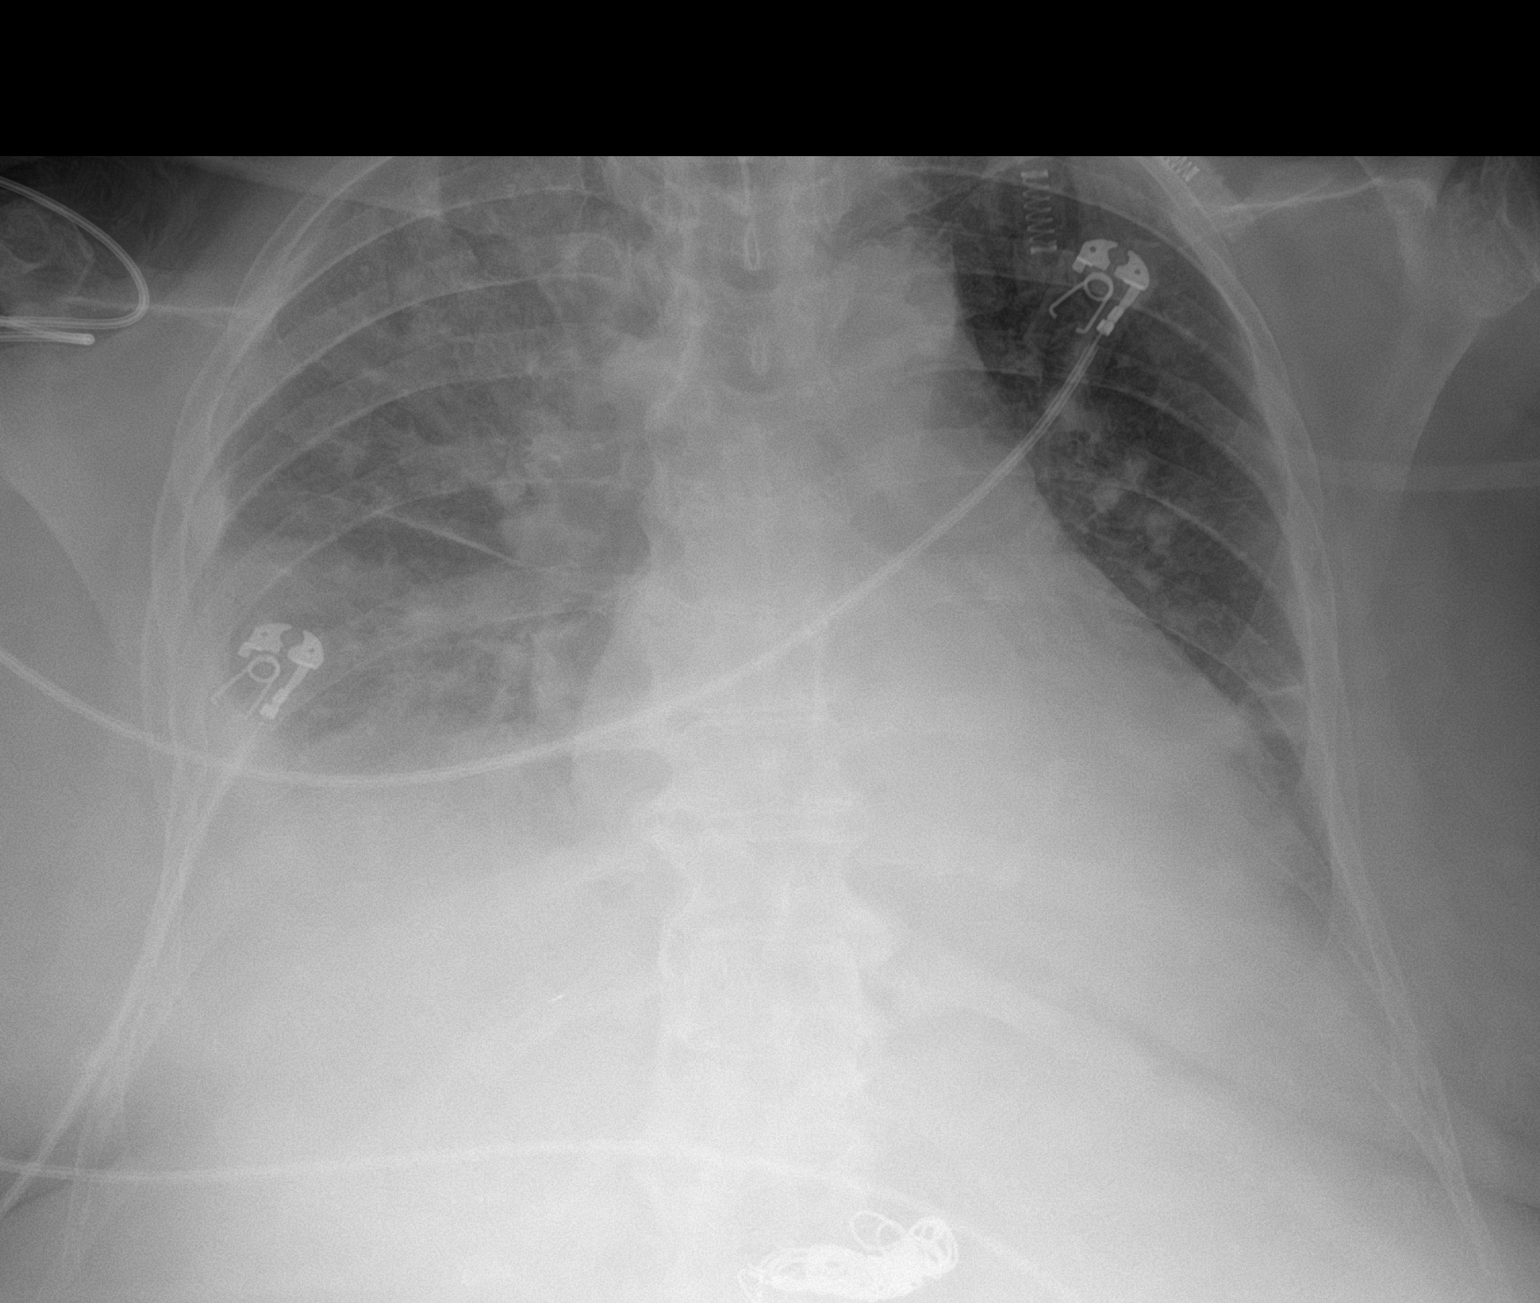

[1 of 1 positions shown; findings below may reference images not displayed]

FINDINGS: Endotracheal tube with the tip 5.3 cm above the carina. Bilateral
interstitial and patchy alveolar airspace opacities, right greater
than left. Small bilateral pleural effusions. No pneumothorax.
Stable cardiomegaly.

No acute osseous abnormality.
IMPRESSION: 1. Endotracheal tube with the tip 5.3 cm above the carina.
2. Bilateral interstitial and patchy alveolar airspace opacities,
right greater than left, and small bilateral pleural effusions.
Differential considerations include pulmonary edema versus
multilobar pneumonia.

## 2023-05-25 IMAGING — DX DG CHEST 1V PORT
1 series · 1 of 1 positions shown · non-contrast
Comparison: 12/30/2021

CLINICAL DATA: Hypoxia, extubated

EXAM:
PORTABLE CHEST 1 VIEW

[chest]
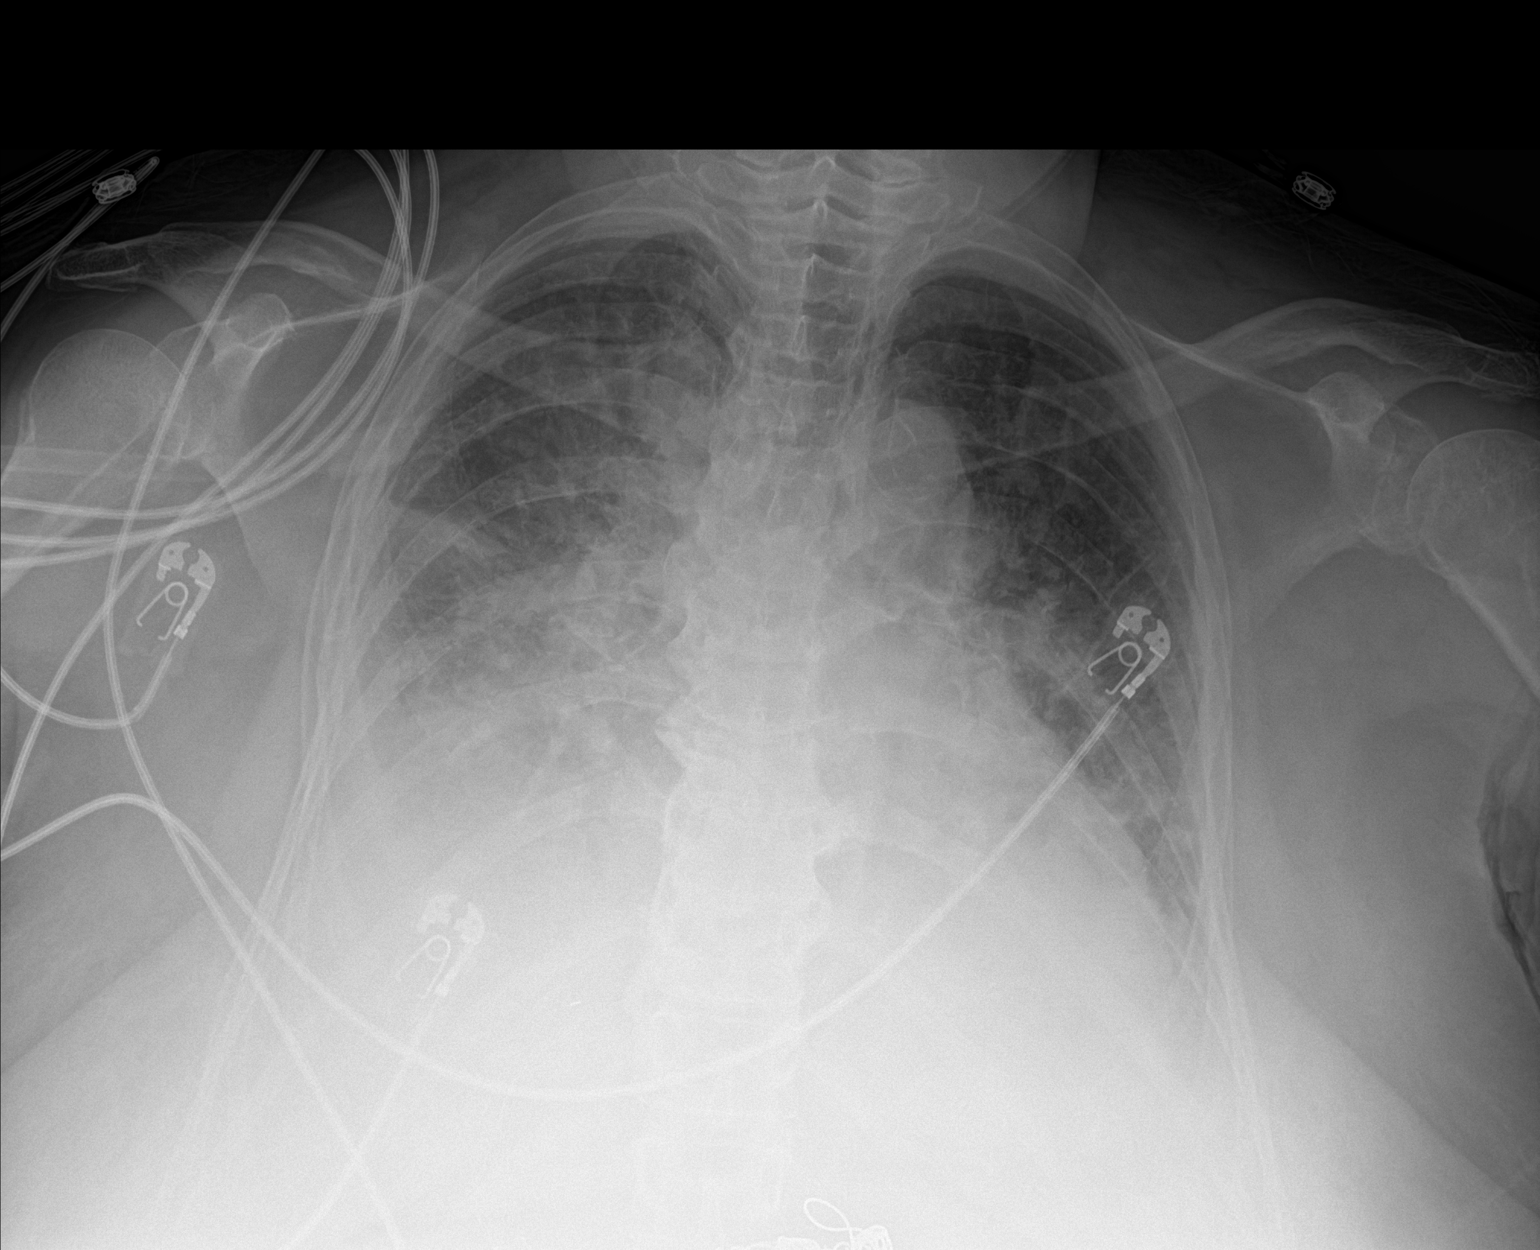

[1 of 1 positions shown; findings below may reference images not displayed]

FINDINGS: Interval extubation. Similar cardiomegaly with diffuse airspace
opacities versus edema. Bilateral pleural effusions layering
posteriorly again noted. Little change in aeration. No pneumothorax.
Trachea midline. Degenerative changes of the spine. Aorta
atherosclerotic.
IMPRESSION: Stable cardiomegaly with diffuse airspace process and pleural
effusions. CHF is favored over severe pneumonia.

## 2023-05-26 IMAGING — DX DG ABD PORTABLE 1V
1 series · 1 of 1 positions shown · non-contrast
Comparison: None Available.

CLINICAL DATA: Feeding tube placement

EXAM:
PORTABLE ABDOMEN - 1 VIEW

[abdomen]
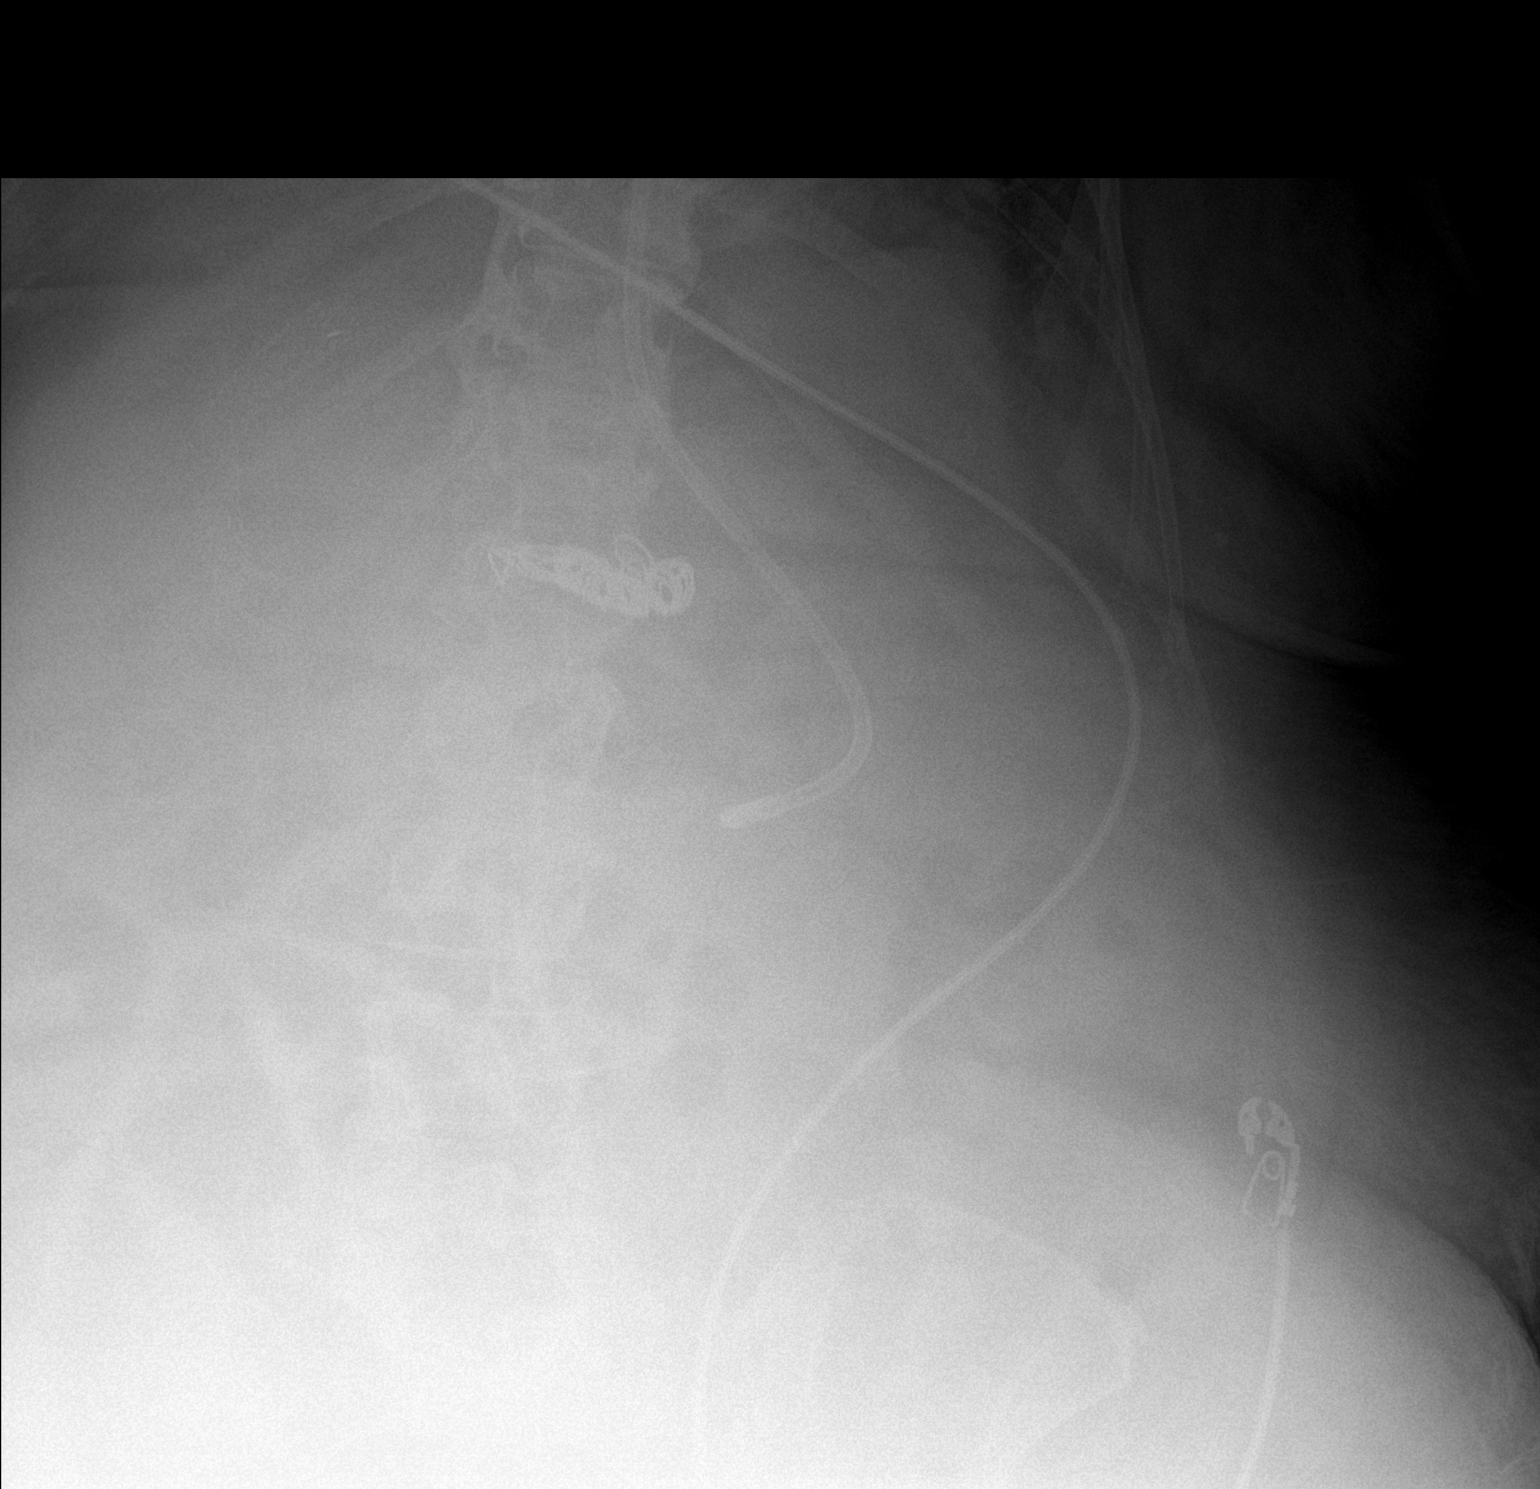

[1 of 1 positions shown; findings below may reference images not displayed]

FINDINGS: Tip of feeding tube is noted at the junction of body and antrum of
the stomach. Bowel gas pattern in the upper abdomen is unremarkable.
IMPRESSION: Tip of feeding tube is seen in the stomach.

## 2023-06-02 IMAGING — DX DG CHEST 1V PORT
1 series · 1 of 1 positions shown · non-contrast
Comparison: Previous studies including the examination of
01/06/2022

CLINICAL DATA: Status post thoracentesis

EXAM:
PORTABLE CHEST 1 VIEW

[chest]
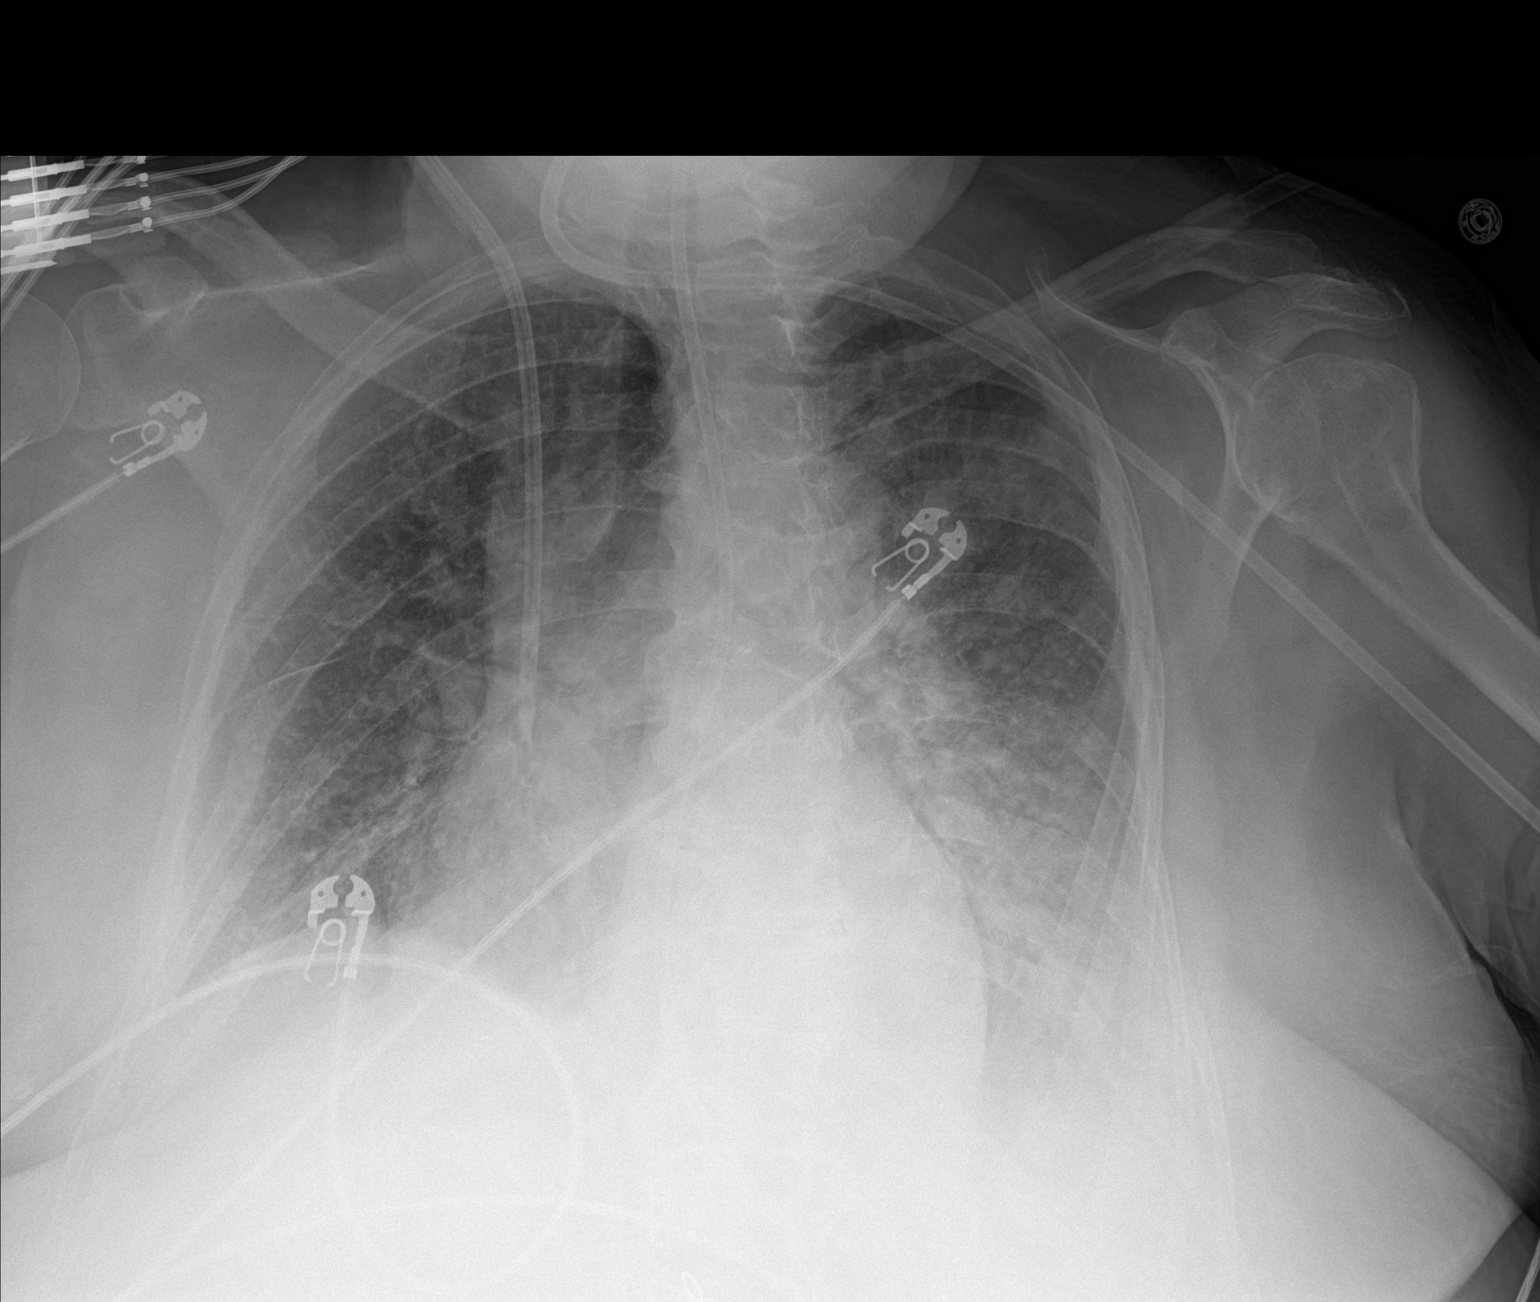

[1 of 1 positions shown; findings below may reference images not displayed]

FINDINGS: There is improvement in aeration of right hemithorax, possibly due
to decrease in pleural effusion. Transverse diameter of heart is
increased. Central pulmonary vessels are prominent. Increased
interstitial and alveolar markings are seen in the parahilar regions
and lower lung fields, more so on the left side. There is blunting
of both lateral CP angles. There is no pneumothorax. Tip of dialysis
catheter is seen in the superior vena cava close to the right
atrium. Enteric tube is noted traversing the esophagus.
IMPRESSION: There is improvement in aeration of right hemithorax suggesting
decrease in right pleural effusion. Cardiomegaly. Central pulmonary
vessels are prominent with increased interstitial and alveolar
markings suggesting CHF. Bilateral pleural effusions, more so on the
left side. There is no pneumothorax.

## 2023-06-03 IMAGING — DX DG CHEST 1V PORT
1 series · 1 of 1 positions shown · non-contrast
Comparison: Portable chest yesterday at [DATE] p.m.

CLINICAL DATA: Evaluate pleural effusions, pneumothorax.

EXAM:
PORTABLE CHEST 1 VIEW

[chest]
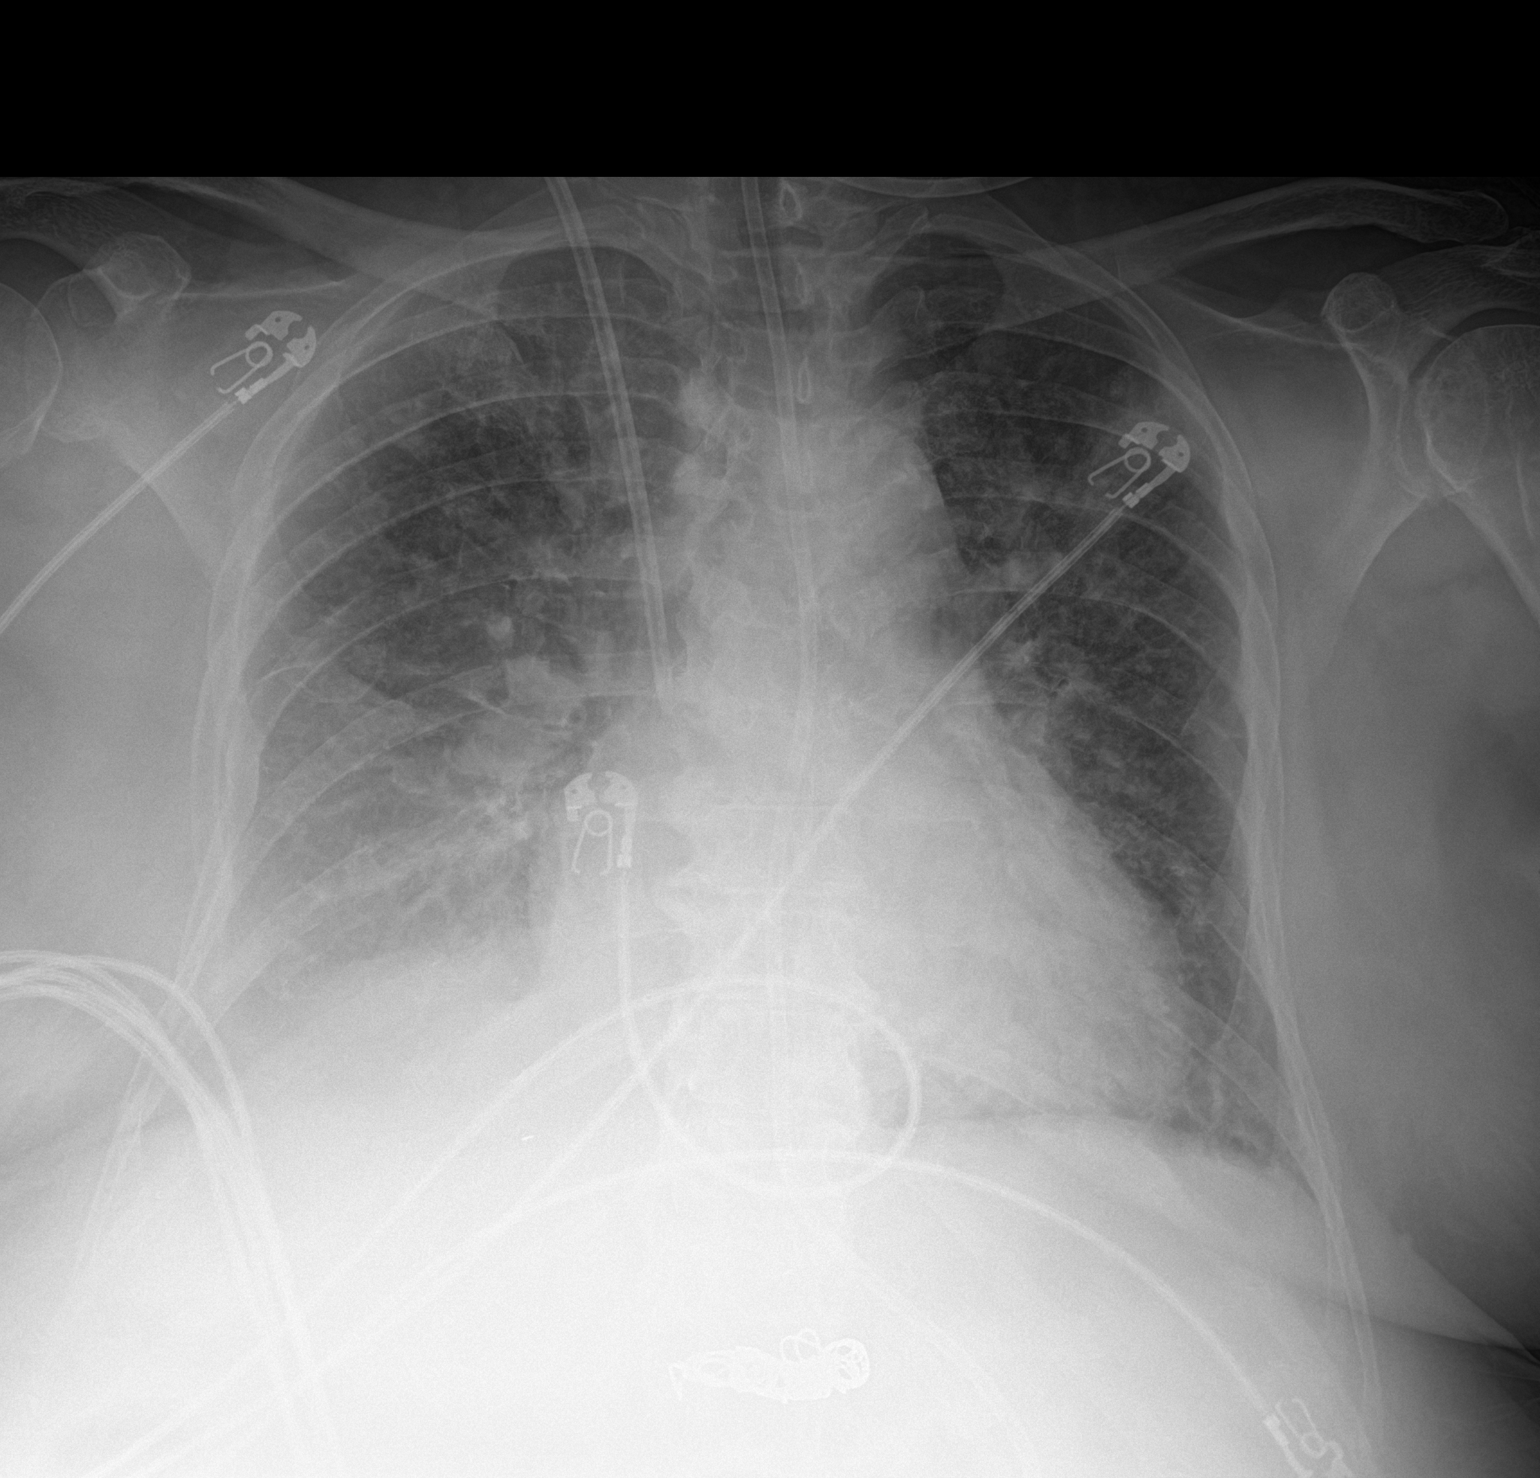

[1 of 1 positions shown; findings below may reference images not displayed]

FINDINGS: [DATE] a.m., 01/09/2022. Double lumen right IJ catheter again has its
tip at the superior cavoatrial junction with feeding tube entering
the stomach with the intragastric course not filmed.

The heart is enlarged. Perihilar vascular congestion and
mild-to-moderate interstitial edema continue to be seen slightly
worsened in the interval.

There is improvement in aeration in the left lower lung field. No
pneumothorax is seen post thoracentesis.

Small right and minimal left pleural effusions are noted with
increased interstitial haziness in the right base which could be
increasing atelectasis or consolidation.

There is no change in aeration of the upper zones which show faint
patchy ground-glass opacities consistent with ground-glass edema or
pneumonitis. The mediastinum is stable.
IMPRESSION: 1. No post thoracentesis pneumothorax is seen.
2. Perihilar vascular congestion and mild-to-moderate interstitial
edema slightly worsened.
3. Improved left lower zonal aeration with only minimal pleural
effusion with small right pleural effusion again noted.
4. Increased right lower zonal haziness consistent with increased
atelectasis or consolidation.
5. Stable patchy haziness in the upper zones.
